# Patient Record
Sex: Female | Born: 1960 | Race: White | Hispanic: No | Marital: Married | Smoking: Never smoker
Health system: Southern US, Community
[De-identification: ages and names within clinical notes are randomized; demographics above are authoritative.]

## PROBLEM LIST (undated history)

## (undated) ENCOUNTER — Emergency Department (HOSPITAL_COMMUNITY): Payer: BC Managed Care – PPO

## (undated) DIAGNOSIS — D649 Anemia, unspecified: Secondary | ICD-10-CM

## (undated) DIAGNOSIS — E669 Obesity, unspecified: Secondary | ICD-10-CM

## (undated) DIAGNOSIS — Z86010 Personal history of colonic polyps: Secondary | ICD-10-CM

## (undated) DIAGNOSIS — I10 Essential (primary) hypertension: Secondary | ICD-10-CM

## (undated) DIAGNOSIS — G4733 Obstructive sleep apnea (adult) (pediatric): Secondary | ICD-10-CM

## (undated) DIAGNOSIS — E785 Hyperlipidemia, unspecified: Secondary | ICD-10-CM

## (undated) DIAGNOSIS — K5520 Angiodysplasia of colon without hemorrhage: Secondary | ICD-10-CM

## (undated) DIAGNOSIS — K5792 Diverticulitis of intestine, part unspecified, without perforation or abscess without bleeding: Secondary | ICD-10-CM

## (undated) DIAGNOSIS — E119 Type 2 diabetes mellitus without complications: Secondary | ICD-10-CM

## (undated) DIAGNOSIS — K219 Gastro-esophageal reflux disease without esophagitis: Secondary | ICD-10-CM

## (undated) DIAGNOSIS — Z9989 Dependence on other enabling machines and devices: Secondary | ICD-10-CM

## (undated) HISTORY — DX: Obstructive sleep apnea (adult) (pediatric): G47.33

## (undated) HISTORY — DX: Essential (primary) hypertension: I10

## (undated) HISTORY — PX: COLONOSCOPY: SHX174

## (undated) HISTORY — DX: Type 2 diabetes mellitus without complications: E11.9

## (undated) HISTORY — DX: Gastro-esophageal reflux disease without esophagitis: K21.9

## (undated) HISTORY — DX: Diverticulitis of intestine, part unspecified, without perforation or abscess without bleeding: K57.92

## (undated) HISTORY — DX: Personal history of colonic polyps: Z86.010

## (undated) HISTORY — DX: Angiodysplasia of colon without hemorrhage: K55.20

## (undated) HISTORY — DX: Dependence on other enabling machines and devices: Z99.89

## (undated) HISTORY — PX: ABDOMINAL HYSTERECTOMY: SHX81

## (undated) HISTORY — PX: POLYPECTOMY: SHX149

## (undated) HISTORY — PX: TUBAL LIGATION: SHX77

## (undated) HISTORY — DX: Anemia, unspecified: D64.9

## (undated) HISTORY — DX: Obesity, unspecified: E66.9

## (undated) HISTORY — DX: Hyperlipidemia, unspecified: E78.5

---

## 2003-08-26 ENCOUNTER — Encounter: Payer: Self-pay | Admitting: Internal Medicine

## 2004-01-05 ENCOUNTER — Ambulatory Visit: Payer: Self-pay | Admitting: Internal Medicine

## 2004-01-12 ENCOUNTER — Ambulatory Visit: Payer: Self-pay | Admitting: Internal Medicine

## 2004-02-08 ENCOUNTER — Ambulatory Visit (HOSPITAL_COMMUNITY): Admission: RE | Admit: 2004-02-08 | Discharge: 2004-02-08 | Payer: Self-pay | Admitting: Internal Medicine

## 2004-02-08 ENCOUNTER — Ambulatory Visit: Payer: Self-pay | Admitting: Internal Medicine

## 2006-09-24 ENCOUNTER — Encounter: Admission: RE | Admit: 2006-09-24 | Discharge: 2006-09-24 | Payer: Self-pay | Admitting: Internal Medicine

## 2007-09-25 ENCOUNTER — Encounter: Admission: RE | Admit: 2007-09-25 | Discharge: 2007-09-25 | Payer: Self-pay | Admitting: Family Medicine

## 2008-02-24 ENCOUNTER — Encounter: Admission: RE | Admit: 2008-02-24 | Discharge: 2008-02-24 | Payer: Self-pay | Admitting: Internal Medicine

## 2009-06-29 ENCOUNTER — Encounter: Payer: Self-pay | Admitting: Internal Medicine

## 2009-08-16 ENCOUNTER — Ambulatory Visit: Payer: Self-pay | Admitting: Internal Medicine

## 2009-08-16 DIAGNOSIS — D509 Iron deficiency anemia, unspecified: Secondary | ICD-10-CM | POA: Insufficient documentation

## 2009-08-16 DIAGNOSIS — R197 Diarrhea, unspecified: Secondary | ICD-10-CM | POA: Insufficient documentation

## 2009-08-16 DIAGNOSIS — K5520 Angiodysplasia of colon without hemorrhage: Secondary | ICD-10-CM | POA: Insufficient documentation

## 2009-08-16 HISTORY — DX: Angiodysplasia of colon without hemorrhage: K55.20

## 2009-08-16 LAB — CONVERTED CEMR LAB
Basophils Absolute: 0 10*3/uL (ref 0.0–0.1)
Basophils Relative: 0.3 % (ref 0.0–3.0)
Eosinophils Absolute: 0.1 10*3/uL (ref 0.0–0.7)
Lymphocytes Relative: 22 % (ref 12.0–46.0)
MCHC: 32.8 g/dL (ref 30.0–36.0)
MCV: 70.2 fL — ABNORMAL LOW (ref 78.0–100.0)
Monocytes Absolute: 0.7 10*3/uL (ref 0.1–1.0)
Neutrophils Relative %: 69.8 % (ref 43.0–77.0)
Platelets: 240 10*3/uL (ref 150.0–400.0)
RBC: 4.41 M/uL (ref 3.87–5.11)
RDW: 19.1 % — ABNORMAL HIGH (ref 11.5–14.6)

## 2009-08-18 LAB — CONVERTED CEMR LAB: Tissue Transglutaminase Ab, IgA: 12.2 units (ref <20)

## 2009-09-05 ENCOUNTER — Ambulatory Visit: Payer: Self-pay | Admitting: Gynecology

## 2009-09-05 ENCOUNTER — Other Ambulatory Visit: Admission: RE | Admit: 2009-09-05 | Discharge: 2009-09-05 | Payer: Self-pay | Admitting: Gynecology

## 2009-09-11 ENCOUNTER — Ambulatory Visit: Payer: Self-pay | Admitting: Gynecology

## 2009-09-25 ENCOUNTER — Ambulatory Visit: Payer: Self-pay | Admitting: Gynecology

## 2009-09-28 ENCOUNTER — Ambulatory Visit: Payer: Self-pay | Admitting: Gynecology

## 2009-10-13 ENCOUNTER — Ambulatory Visit: Payer: Self-pay | Admitting: Gynecology

## 2009-11-10 ENCOUNTER — Ambulatory Visit: Payer: Self-pay | Admitting: Gynecology

## 2009-11-13 ENCOUNTER — Ambulatory Visit: Payer: Self-pay | Admitting: Gynecology

## 2009-11-20 ENCOUNTER — Ambulatory Visit: Payer: Self-pay | Admitting: Gynecology

## 2009-11-29 ENCOUNTER — Ambulatory Visit (HOSPITAL_COMMUNITY)
Admission: RE | Admit: 2009-11-29 | Discharge: 2009-11-29 | Payer: Self-pay | Source: Home / Self Care | Admitting: Gynecology

## 2009-12-18 ENCOUNTER — Ambulatory Visit: Payer: Self-pay | Admitting: Gynecology

## 2009-12-18 ENCOUNTER — Encounter: Payer: Self-pay | Admitting: Gynecology

## 2009-12-18 ENCOUNTER — Inpatient Hospital Stay (HOSPITAL_COMMUNITY)
Admission: RE | Admit: 2009-12-18 | Discharge: 2009-12-20 | Payer: Self-pay | Source: Home / Self Care | Attending: Gynecology | Admitting: Gynecology

## 2009-12-29 ENCOUNTER — Ambulatory Visit: Payer: Self-pay | Admitting: Gynecology

## 2010-01-10 ENCOUNTER — Ambulatory Visit: Payer: Self-pay | Admitting: Gynecology

## 2010-01-31 ENCOUNTER — Ambulatory Visit
Admission: RE | Admit: 2010-01-31 | Discharge: 2010-01-31 | Payer: Self-pay | Source: Home / Self Care | Attending: Gynecology | Admitting: Gynecology

## 2010-02-07 ENCOUNTER — Encounter
Admission: RE | Admit: 2010-02-07 | Discharge: 2010-02-07 | Payer: Self-pay | Source: Home / Self Care | Attending: Gynecology | Admitting: Gynecology

## 2010-02-13 NOTE — Procedures (Signed)
Summary: EGD:    EGD  Procedure date:  01/12/2004  Findings:      Findings: Abnormal Exam Location:  [facility]  Findings - Normal: Proximal Esophagus to Antrum.  Normal: Duodenal Bulb.  MUCOSAL ABNORMALITY: Duodenal Apex to Duodenal 2nd Portion. Granular mucosa. Biopsy/Mucosal Abn taken. Comment: Mild, probably normal but biopsied to exclude sprue.   Assessment Abnormal examination, see findings above.  Comments: ? Sprue, await path.   Pathology: BENIGN SMALL BOWEL MUCOSA.  NO ACTIVE INFLAMMATION OR VILLOUS ATROPHY IDENTIFIED. Patient Name: Catherine, Trevino MRN:  Procedure Procedures: Panendoscopy (EGD) CPT: 43235.    with biopsy(s)/brushing(s). CPT: D1846139.  Personnel: Endoscopist: Iva Boop, MD, [facility].  Referred By: Maylon Peppers Cleta Alberts, MD.  Exam Location: Exam performed in Outpatient Clinic. Outpatient  Patient Consent: Procedure, Alternatives, Risks and Benefits discussed, consent obtained, from patient. Consent was obtained by the RN.  Indications  Evaluation of: Anemia,   Symptoms: Diarrhea.  History  Current Medications: Patient is not currently taking Coumadin.  Comments: DIARRHEA, ANEMIA. NEGATIVE COLONOSCOPY TO TERMINAL ILEUM (with biopsies). SHE IS ON IRON AND ANEMIA NOT IMPROVED PER PT. Pre-Exam Physical: Performed Jan 12, 2004  Cardio-pulmonary exam, HEENT exam, Mental status exam WNL.  Exam Exam Info: Maximum depth of insertion Duodenum, intended Duodenum. Patient position: on left side. Gastric retroflexion performed. Images taken. ASA Classification: I. Tolerance: excellent.  Sedation Meds: Patient assessed and found to be appropriate for moderate (conscious) sedation. Fentanyl 50 mcg. given IV. Versed 7 mg. given IV. Cetacaine Spray 2 sprays given aerosolized.  Monitoring: BP and pulse monitoring done. Oximetry used. Supplemental O2 given  Findings - Normal: Proximal Esophagus to Antrum.  Normal: Duodenal Bulb.    MUCOSAL ABNORMALITY: Duodenal Apex to Duodenal 2nd Portion. Granular mucosa. Biopsy/Mucosal Abn taken. Comment: Mild, probably normal but biopsied to exclude sprue.   Assessment Abnormal examination, see findings above.  Comments: ? Sprue, await path.  Events  Unplanned Intervention: No unplanned interventions were required.  Plans Medication(s): Await pathology.  Disposition: After procedure patient sent to recovery. After recovery patient sent home.  Scheduling: Await pathology to schedule patient.  Comments: If this is normal I think a capsule endoscopy of small bowel may be needed. Will discuss with patient and [name] when path returns.  CC:   Lesle Chris, MD  This report was created from the original endoscopy report, which was reviewed and signed by the above listed endoscopist.

## 2010-02-13 NOTE — Procedures (Signed)
Summary: Capsule Endoscopy/[place]  Capsule Endoscopy/[place]   Imported By: Sherian Rein 08/17/2009 08:54:54  _____________________________________________________________________  External Attachment:    Type:   Image     Comment:   External Document

## 2010-02-13 NOTE — Procedures (Signed)
Summary: Colonoscopy: Diverticulosis   Colonoscopy  Procedure date:  08/26/2003  Findings:      Results: Diverticulosis. Location:  [facility].  Findings - NORMAL EXAM: Terminal Ileum to Descending Colon. Biopsy/Normal Exam taken. Comments: RANDOM COLON BIOPSIES.  DIVERTICULOSIS: Sigmoid Colon.   Pathology: Essentially unremakrable colonic mucosa, negative for active and/or microscopic colitis.  Procedures Next Due Date:    Colonoscopy: 08/2011  [facility] Reports-CCC] Patient Name: Catherine Trevino, Catherine Trevino MRN:  Procedure Procedures: Colonoscopy CPT: 16109.    with biopsy. CPT: Q5068410.  Personnel: Endoscopist: Iva Boop, MD, [facility].  Referred By: Maylon Peppers Cleta Alberts, MD.  Exam Location: Exam performed in Outpatient Clinic. Outpatient  Patient Consent: Procedure, Alternatives, Risks and Benefits discussed, consent obtained, from patient. Consent was obtained by the RN.  Indications  Evaluation of: Anemia Positive fecal occult blood test  Symptoms: Diarrhea Abdominal pain / bloating.  History  Current Medications: Patient is not currently taking Coumadin.  Comments: HGB 7, HEME +, LLQ PAIN AND DIRRHEA X 6 MOS. DENIES MENORRHAGIA. SHE SAYS CT ABD/PELVIS NEGATIVE Pre-Exam Physical: Performed Aug 26, 2003. Cardio-pulmonary exam, Rectal exam, HEENT exam  WNL. Abdominal exam abnormal. Mental status exam WNL.  Exam Exam: Extent of exam reached: Terminal Ileum, extent intended: Terminal Ileum.  The cecum was identified by appendiceal orifice and IC valve. Patient position: on left side. Colon retroflexion performed. Images taken. ASA Classification: I. Tolerance: excellent.  Monitoring: Pulse and BP monitoring, Oximetry used. Supplemental O2 given.  Colon Prep Used Half-Lytely for colon prep. Prep results: good.  Sedation Meds: Patient assessed and found to be appropriate for moderate (conscious) sedation. Fentanyl 100 mcg. given IV. Versed 10 mg. given IV.    Findings - NORMAL EXAM: Terminal Ileum to Descending Colon. Biopsy/Normal Exam taken. Comments: RANDOM COLON BIOPSIES.  DIVERTICULOSIS: Sigmoid Colon. ICD9: Diverticulosis, Colon: 562.10. Comments: MILD.  NORMAL EXAM: Rectum.   Assessment Abnormal examination, see findings above.  Diagnoses: 562.10: Diverticulosis, Colon.   Comments: NO CAUSE OF ANEMIA, HEME + STOOL OR DIARRHEA OBVIOUS. BIOPSIES PENDING. Events  Unplanned Interventions: No intervention was required.  Plans Medication Plan: Referring provider to order medications.  Comments: LOMOTIL RX (IMODIUM DIDN'T WORK) Disposition: After procedure patient sent to recovery. After recovery patient sent home.  Scheduling/Referral: Path Letter, to The Patient, RE: RESULTS,  Clinic Visit, to [facility] A. Cleta Alberts, MD, AS PLANNED,   Comments: SHE MAY NEED AN EGD IF PATH IS UNREVEALING (SUSPECT IT WILL BE)  CC: Lesle Chris, MD  This report was created from the original endoscopy report, which was reviewed and signed by the above listed endoscopist.

## 2010-02-13 NOTE — Procedures (Signed)
Summary: Capsule Endoscopy: Gastric and Small Bowel AVM's   Capsule Endoscopy  Procedure date:  02/08/2004  Findings:      Abnormal: Performing Location: [facility]   PROCEDURE INFO & FINDINGS: TWO SMALL BOWEL AVM'S (JEJUNUM) AND A TINY ANTRAL AVM.  MUCH OF ILEUM NOT SEEN WELL DUE TO FLUID.    Appended Document: Capsule Endoscopy: Gastric and Small Bowel AVM's NAME:  Catherine Trevino, Catherine Trevino             ACCOUNT NO.:  [id number]   MEDICAL RECORD NO.:  [id number]          PATIENT TYPE:  AMB   LOCATION:  ENDO                         FACILITY:  [facility]   PHYSICIAN:  Iva Boop, M.D. LHCDATE OF BIRTH:  Jun 23, 1960   DATE OF PROCEDURE:  02/08/2004  DATE OF DISCHARGE:  02/08/2004                                 OPERATIVE REPORT   PROCEDURE:  Small bowel capsule endoscopy.   INDICATIONS FOR PROCEDURE:  Hemoccult-positive stool, iron deficiency  anemia.  Upper GI endoscopy and colonoscopy have not yielded a cause for her  problems.   DESCRIPTION OF PROCEDURE:  Small bowel capsule endoscopy was carried out  under the usual protocol at [facility].  She tolerated the  procedure well.  Gastric passage time was 31 minutes, and small bowel  passage time 3 hours and 11 minutes.   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  She had fluid in the ileum which limited views there.   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  This is the probable cause of her iron  deficiency anemia.  Would continue iron as needed either orally or  parenterally.  No further gastrointestinal workup at this time.  I would be  happy to see her back if there are persistent concerns and questions.      CEG/MEDQ  D:  02/21/2004  T:  02/21/2004  Job:  [id number]   cc:   Naomie Dean

## 2010-02-13 NOTE — Assessment & Plan Note (Signed)
Summary: angiodysplasia of the intestine--ch.   History of Present Illness Visit Type: consult  Primary GI MD: Stan Head MD [facility] Primary Provider: Sandria Bales. Alwyn Ren, MD  Requesting Provider: Sandria Bales. Alwyn Ren, MD  Chief Complaint: Upper abd pain  History of Present Illness:   50 yo ww with prior iron-deficiency anemia issues. Seen in 2005 and had ancolonoscopy, EGD and capsule endoscopy.  A few AVM's seen (1 tiny gastric and some small bowel) on capsule endoscopy.  Still has intermittent diarrhea but can be days without. RUQ pain at times, had US,no gallstones. Intermittent problem, cannot correlate with eating, does not disturb sleep. She may het it a few times a month, lasts 15-20 mintes. sharp with residual soreness.  Anemia has persisted over time despite taking taking iron daily (max wice a day). Hgb has not normalized she says. She started alfalfa supplements to reduce period bleeding.  Constant fatigue.   Still menstruates and they are initially heavy. Not like that in past. She bleeds 7 days, and for a few years and they are loner in duration and heavier over time.   GI Review of Systems    Reports abdominal pain, acid reflux, and  heartburn.     Location of  Abdominal pain: upper abdomen.    Denies belching, bloating, chest pain, dysphagia with liquids, dysphagia with solids, loss of appetite, nausea, vomiting, vomiting blood, weight loss, and  weight gain.      Reports hemorrhoids.     Denies anal fissure, black tarry stools, change in bowel habit, constipation, diarrhea, diverticulosis, fecal incontinence, heme positive stool, irritable bowel syndrome, jaundice, light color stool, liver problems, rectal bleeding, and  rectal pain.    Clinical Reports Reviewed:  Colonoscopy:  08/26/2003:  Results: Diverticulosis. Location:  [facility].  Findings - NORMAL EXAM: Terminal Ileum to Descending Colon. Biopsy/Normal Exam taken. Comments: RANDOM COLON BIOPSIES.    DIVERTICULOSIS: Sigmoid Colon.   Pathology: Essentially unremakrable colonic mucosa, negative for active and/or microscopic colitis.  EGD:  01/12/2004:  Findings: Abnormal Exam Location: [facility]  Findings - Normal: Proximal Esophagus to Antrum.  Normal: Duodenal Bulb.  MUCOSAL ABNORMALITY: Duodenal Apex to Duodenal 2nd Portion. Granular mucosa. Biopsy/Mucosal Abn taken. Comment: Mild, probably normal but biopsied to exclude sprue.   Assessment Abnormal examination, see findings above.  Comments: ? Sprue, await path.   Pathology: BENIGN SMALL BOWEL MUCOSA.  NO ACTIVE INFLAMMATION OR VILLOUS ATROPHY IDENTIFIED.  Capsule Endoscopy:  02/08/2004:  Abnormal: Performing Location: [facility]   PROCEDURE INFO & FINDINGS: TWO SMALL BOWEL AVM'S (JEJUNUM) AND A TINY ANTRAL AVM.  MUCH OF ILEUM NOT SEEN WELL DUE TO FLUID.   Current Medications (verified): 1)  Hydrochlorothiazide 12.5 Mg Caps (Hydrochlorothiazide) .... Take 1 Tablet By Mouth Once A Day 2)  Poly-Iron 150 Forte 150-25-1 Mg-Mcg-Mg Caps (Iron Polysacch Cmplx-B12-Fa) .... One Tablet By Mouth Two Times A Day 3)  Evening Primrose Oil 500 Mg Caps (Evening Primrose Oil) .... One Capsule By Mouth Once Daily 4)  Alfalfa 650 Mg Tabs (Alfalfa) .... One Tablet By Mouth Once Daily  Allergies (verified): No Known Drug Allergies  Past History:  Past Medical History: Vitamin D deficiency Anemia-Iron deficiency  Hypertension Obesity GERD  Past Surgical History: Reviewed history from 08/16/2009 and no changes required. 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  Alcohol Use - no Daily Caffeine Use: 1 daily  Illicit Drug Use - no Smoking Status:  never Drug Use:  no  Review of Systems       The patient complains of sleeping problems and swelling of feet/legs.         All other ROS  negative except as per HPI.   Vital Signs:  Patient profile:   50 year old female Height:      64 inches Weight:      222 pounds BMI:     38.24 BSA:     2.05 Pulse rate:   76 / minute Pulse rhythm:   regular BP sitting:   126 / 74  (left arm) Cuff size:   regular  Vitals Entered By: Ok Anis CMA (August 16, 2009 2:46 PM)  Physical Exam  General:  obese.  NAD Eyes:  PERRLA, no icterus. Mouth:  No deformity or lesions, dentition normal. Neck:  Supple; no masses or thyromegaly. Lungs:  Clear throughout to auscultation. Heart:  Regular rate and rhythm; no murmurs, rubs,  or bruits. Abdomen:  Soft, nontender and nondistended. No masses, hepatosplenomegaly or hernias noted. Normal bowel sounds. Extremities:  No clubbing, cyanosis, edema or deformities noted. Neurologic:  Alert and  oriented x4;  grossly normal neurologically. Cervical Nodes:  No significant cervical or supraclavicular adenopathy.  Psych:  Alert and cooperative. Normal mood and affect.  [facility] office notes, iron studies reviewed  Impression & Recommendations:  Problem # 1:  ANEMIA-IRON DEFICIENCY (ICD-280.9) Assessment New Ferritin 16, iron sat 4% Differential dx includes GI loss vs GYN loss vs malabsorption. at least pat GYN loss  Plan: hemoccults celiac ab may need iron infusion GYN eval? - await these tests review last year of Hgbs could need repeat endoscopic evaluation (and will if heme +) I think a GYN evaluation is appropriate regardless.  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  2)  We will call you with further follow up after reviewing these results.  3)  We will obtain CBC's for the past year from Dr. Alwyn Ren and call you with any further follow up. 4)  Please return home hemoccult tests. 5)  Copy sent to : Janace Hoard, MD 6)  The medication list was reviewed and reconciled.  All changed / newly prescribed medications were explained.  A complete medication list was provided to the patient / caregiver.

## 2010-02-13 NOTE — Letter (Signed)
Summary: [facility]  [facility]   Imported By: Sherian Rein 08/21/2009 11:13:27  _____________________________________________________________________  External Attachment:    Type:   Image     Comment:   External Document

## 2010-03-27 LAB — COMPREHENSIVE METABOLIC PANEL
ALT: 31 U/L (ref 0–35)
ALT: 23 U/L (ref 0–35)
AST: 20 U/L (ref 0–37)
Alkaline Phosphatase: 75 U/L (ref 39–117)
Alkaline Phosphatase: 71 U/L (ref 39–117)
BUN: 10 mg/dL (ref 6–23)
CO2: 25 mEq/L (ref 19–32)
CO2: 30 mEq/L (ref 19–32)
Calcium: 8.8 mg/dL (ref 8.4–10.5)
Calcium: 9 mg/dL (ref 8.4–10.5)
GFR calc Af Amer: >60 mL/min (ref >60)
GFR calc non Af Amer: >60 mL/min (ref >60)
GFR calc non Af Amer: >60 mL/min (ref >60)
Glucose, Bld: 85 mg/dL (ref 70–99)
Potassium: 3.7 mEq/L (ref 3.5–5.1)
Potassium: 4.2 mEq/L (ref 3.5–5.1)
Sodium: 136 mEq/L (ref 135–145)
Sodium: 145 mEq/L (ref 135–145)

## 2010-03-27 LAB — SURGICAL PCR SCREEN
MRSA, PCR: NEGATIVE
Staphylococcus aureus: NEGATIVE

## 2010-03-27 LAB — APTT: aPTT: 31 seconds (ref 24–37)

## 2010-03-27 LAB — URINE MICROSCOPIC-ADD ON

## 2010-03-27 LAB — URINALYSIS, ROUTINE W REFLEX MICROSCOPIC
Bilirubin Urine: NEGATIVE
Bilirubin Urine: NEGATIVE
Hgb urine dipstick: NEGATIVE
Ketones, ur: NEGATIVE mg/dL
Nitrite: NEGATIVE
Protein, ur: NEGATIVE mg/dL
Specific Gravity, Urine: 1.025 (ref 1.005–1.030)
Specific Gravity, Urine: >1.03 — ABNORMAL HIGH (ref 1.005–1.030)
Urobilinogen, UA: 0.2 mg/dL (ref 0.0–1.0)
pH: 5.5 (ref 5.0–8.0)

## 2010-03-27 LAB — VON WILLEBRAND PANEL
Ristocetin-Cofactor: 58 % (ref 50–150)
Von Willebrand Ag: 68 % normal (ref 61–164)

## 2010-03-27 LAB — FIBRINOGEN: Fibrinogen: 412 mg/dL (ref 204–475)

## 2010-03-27 LAB — CBC
HCT: 39.8 % (ref 36.0–46.0)
Hemoglobin: 13.4 g/dL (ref 12.0–15.0)
Hemoglobin: 11.8 g/dL — ABNORMAL LOW (ref 12.0–15.0)
MCHC: 33.6 g/dL (ref 30.0–36.0)
MCHC: 32.8 g/dL (ref 30.0–36.0)
MCV: 80.3 fL (ref 78.0–100.0)
Platelets: 220 10*3/uL (ref 150–400)
RBC: 4.51 MIL/uL (ref 3.87–5.11)
RDW: 13 % (ref 11.5–15.5)
RDW: 18.7 % — ABNORMAL HIGH (ref 11.5–15.5)
WBC: 10.6 10*3/uL — ABNORMAL HIGH (ref 4.0–10.5)
WBC: 5.9 10*3/uL (ref 4.0–10.5)

## 2010-03-27 LAB — PROTIME-INR: Prothrombin Time: 12.3 seconds (ref 11.6–15.2)

## 2010-03-27 LAB — ABO/RH: ABO/RH(D): O POS

## 2010-03-27 LAB — TYPE AND SCREEN: ABO/RH(D): O POS

## 2010-06-01 NOTE — Op Note (Signed)
[name], Catherine Trevino             ACCOUNT NO.:  [id number]   MEDICAL RECORD NO.:  [id number]          PATIENT TYPE:  AMB   LOCATION:  ENDO                         [facility]AC[facility]   PHYSICIAN:  Iva Boop, M.D. LHCDATE OF BIRTH:  12-Dec-1960   DATE OF PROCEDURE:  02/08/2004  DATE OF DISCHARGE:  02/08/2004                                 OPERATIVE REPORT   PROCEDURE:  Small bowel capsule endoscopy.   INDICATIONS FOR PROCEDURE:  Hemoccult-positive stool, iron deficiency  anemia.  Upper GI endoscopy and colonoscopy have not yielded a cause for her  problems.   DESCRIPTION OF PROCEDURE:  Small bowel capsule endoscopy was carried out  under the usual protocol at [facility].  She tolerated the  procedure well.  Gastric passage time was 31 minutes, and small bowel  passage time 3 hours and 11 minutes.   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  She had fluid in the ileum which limited views there.   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  This is the probable cause of her iron  deficiency anemia.  Would continue iron as needed either orally or  parenterally.  No further gastrointestinal workup at this time.  I would be  happy to see her back if there are persistent concerns and questions.      CEG/MEDQ  D:  02/21/2004  T:  02/21/2004  Job:  [id number]   cc:   Naomie Dean

## 2011-04-01 ENCOUNTER — Other Ambulatory Visit: Payer: Self-pay | Admitting: Family Medicine

## 2011-06-30 ENCOUNTER — Other Ambulatory Visit: Payer: Self-pay | Admitting: Internal Medicine

## 2011-07-15 ENCOUNTER — Encounter: Payer: Self-pay | Admitting: Internal Medicine

## 2011-10-22 ENCOUNTER — Telehealth: Payer: Self-pay | Admitting: *Deleted

## 2011-10-22 NOTE — Telephone Encounter (Signed)
CT scan of her head was normal. Has had a migraine for days that won't go away. Wants to speak to Dr. Riley Kill.

## 2011-10-23 NOTE — Telephone Encounter (Signed)
Is this my patient? 

## 2011-10-23 NOTE — Telephone Encounter (Signed)
Message entered in error on this patient.

## 2011-11-20 ENCOUNTER — Ambulatory Visit (INDEPENDENT_AMBULATORY_CARE_PROVIDER_SITE_OTHER): Payer: BC Managed Care – PPO | Admitting: Family Medicine

## 2011-11-20 VITALS — BP 156/85 | HR 98 | Temp 98.2°F | Resp 16 | Ht 65.0 in | Wt 248.0 lb

## 2011-11-20 DIAGNOSIS — R059 Cough, unspecified: Secondary | ICD-10-CM

## 2011-11-20 DIAGNOSIS — R05 Cough: Secondary | ICD-10-CM

## 2011-11-20 MED ORDER — AZITHROMYCIN 500 MG PO TABS: 500.0000 mg | ORAL_TABLET | Freq: Every day | ORAL | Status: DC

## 2011-11-20 MED ORDER — BENZONATATE 100 MG PO CAPS: 100.0000 mg | ORAL_CAPSULE | Freq: Two times a day (BID) | ORAL | Status: DC | PRN

## 2011-11-20 MED ORDER — HYDROCHLOROTHIAZIDE 12.5 MG PO CAPS: 12.5000 mg | ORAL_CAPSULE | Freq: Every day | ORAL | Status: DC

## 2011-11-20 MED ORDER — ALBUTEROL SULFATE (2.5 MG/3ML) 0.083% IN NEBU: 2.5000 mg | INHALATION_SOLUTION | Freq: Four times a day (QID) | RESPIRATORY_TRACT | Status: DC | PRN

## 2011-11-20 MED ORDER — CEFTRIAXONE SODIUM 1 G IJ SOLR
1.0000 g | Freq: Once | INTRAMUSCULAR | Status: AC
  Administered 2011-11-20: 1 g via INTRAMUSCULAR

## 2011-11-20 MED ORDER — ALBUTEROL SULFATE HFA 108 (90 BASE) MCG/ACT IN AERS: 2.0000 | INHALATION_SPRAY | Freq: Four times a day (QID) | RESPIRATORY_TRACT | Status: DC | PRN

## 2011-11-20 NOTE — Patient Instructions (Signed)
Very nice to meet you. I am going to treat you for bronchitis Your given a shot today and then you going to take one pill daily for 3 days of azithromycin. I have refilled her hydrochlorothiazide. I'm also giving you an inhaler to help with any shortness of breath. Please read below information in you can followup as needed.  Bronchitis Bronchitis is the body's way of reacting to injury and/or infection (inflammation) of the bronchi. Bronchi are the air tubes that extend from the windpipe into the lungs. If the inflammation becomes severe, it may cause shortness of breath. CAUSES  Inflammation may be caused by:  A virus.  Germs (bacteria).  Dust.  Allergens.  Pollutants and many other irritants. The cells lining the bronchial tree are covered with tiny hairs (cilia). These constantly beat upward, away from the lungs, toward the mouth. This keeps the lungs free of pollutants. When these cells become too irritated and are unable to do their job, mucus begins to develop. This causes the characteristic cough of bronchitis. The cough clears the lungs when the cilia are unable to do their job. Without either of these protective mechanisms, the mucus would settle in the lungs. Then you would develop pneumonia. Smoking is a common cause of bronchitis and can contribute to pneumonia. Stopping this habit is the single most important thing you can do to help yourself. TREATMENT   Your caregiver may prescribe an antibiotic if the cough is caused by bacteria. Also, medicines that open up your airways make it easier to breathe. Your caregiver may also recommend or prescribe an expectorant. It will loosen the mucus to be coughed up. Only take over-the-counter or prescription medicines for pain, discomfort, or fever as directed by your caregiver.  Removing whatever causes the problem (smoking, for example) is critical to preventing the problem from getting worse.  Cough suppressants may be prescribed  for relief of cough symptoms.  Inhaled medicines may be prescribed to help with symptoms now and to help prevent problems from returning.  For those with recurrent (chronic) bronchitis, there may be a need for steroid medicines. SEEK IMMEDIATE MEDICAL CARE IF:   During treatment, you develop more pus-like mucus (purulent sputum).  You have a fever.  Your baby is older than 3 months with a rectal temperature of 102 F (38.9 C) or higher.  Your baby is 38 months old or younger with a rectal temperature of 100.4 F (38 C) or higher.  You become progressively more ill.  You have increased difficulty breathing, wheezing, or shortness of breath. It is necessary to seek immediate medical care if you are elderly or sick from any other disease. MAKE SURE YOU:   Understand these instructions.  Will watch your condition.  Will get help right away if you are not doing well or get worse. Document Released: 12/31/2004 Document Revised: 03/25/2011 Document Reviewed: 11/10/2007 [facility] Patient Information 2013 [place], [place].

## 2011-11-20 NOTE — Progress Notes (Signed)
SUBJECTIVE:  Catherine Trevino is a 51 y.o. female who complains of congestion, sore throat, swollen glands, post nasal drip, dry cough, chills and hoarseness for 4 days. She denies a history of chest pain, dizziness, fatigue, nausea, vomiting, weakness and weight loss. She denies a history of asthma but does have a history of bronchitis one year ago. Patient states that this feels about the same. Patient does not smoke cigarettes.   14 system review of systems done and unremarkable as related to the problem.  Past medical history, social, surgical and family history all reviewed.    OBJECTIVE: Vitals as noted above. Appearance: alert, well appearing, and in no distress.  ENT- neck has bilateral anterior cervical nodes enlarged, pharynx erythematous without exudate, post nasal drip noted and nasal mucosa congested.  Chest - no tachypnea, retractions or cyanosis, wheezing noted in all fields but minimal. No focal findings.  ASSESSMENT:  bronchitis  PLAN: Symptomatic therapy suggested: push fluids, rest, use vaporizer or mist prn, return office visit prn if symptoms persist or worsen and discussed red flags and when to seek medical attention. Call or return to clinic prn if these symptoms worsen or fail to improve as anticipated. Azithro ventolin, tessalon rx Also prescribed HCTZ for her BP which we did not talk about and she will discuss this with [name] at her regular follow up.

## 2011-11-20 NOTE — Addendum Note (Signed)
Addended by: Morrell Riddle on: 11/20/2011 08:16 PM   Modules accepted: Orders

## 2012-01-14 ENCOUNTER — Other Ambulatory Visit: Payer: Self-pay | Admitting: Family Medicine

## 2012-01-22 ENCOUNTER — Other Ambulatory Visit: Payer: Self-pay | Admitting: Family Medicine

## 2012-01-23 NOTE — Telephone Encounter (Signed)
Okay to call in  2 refills. She needs an appt.  to be seen here so we can do blood work and check her potassium and kidney function.

## 2012-01-24 NOTE — Telephone Encounter (Signed)
This is done, for #90 patient aware of need for appt.

## 2012-04-20 ENCOUNTER — Other Ambulatory Visit: Payer: Self-pay | Admitting: Emergency Medicine

## 2012-04-22 ENCOUNTER — Encounter: Payer: Self-pay | Admitting: Internal Medicine

## 2012-05-29 ENCOUNTER — Other Ambulatory Visit: Payer: Self-pay | Admitting: Emergency Medicine

## 2012-05-31 ENCOUNTER — Other Ambulatory Visit: Payer: Self-pay | Admitting: Physician Assistant

## 2012-07-03 ENCOUNTER — Ambulatory Visit
Admission: RE | Admit: 2012-07-03 | Discharge: 2012-07-03 | Disposition: A | Discharge status: Home / Self Care | Payer: BC Managed Care – PPO | Source: Ambulatory Visit | Attending: Physician Assistant | Admitting: Physician Assistant

## 2012-07-03 ENCOUNTER — Telehealth: Payer: Self-pay | Admitting: Radiology

## 2012-07-03 ENCOUNTER — Ambulatory Visit (INDEPENDENT_AMBULATORY_CARE_PROVIDER_SITE_OTHER): Payer: BC Managed Care – PPO | Admitting: Physician Assistant

## 2012-07-03 VITALS — BP 168/102 | HR 90 | Temp 97.7°F | Resp 16 | Ht 65.0 in | Wt 253.0 lb

## 2012-07-03 DIAGNOSIS — R51 Headache: Secondary | ICD-10-CM

## 2012-07-03 DIAGNOSIS — I10 Essential (primary) hypertension: Secondary | ICD-10-CM | POA: Insufficient documentation

## 2012-07-03 DIAGNOSIS — K219 Gastro-esophageal reflux disease without esophagitis: Secondary | ICD-10-CM

## 2012-07-03 DIAGNOSIS — J029 Acute pharyngitis, unspecified: Secondary | ICD-10-CM

## 2012-07-03 LAB — POCT CBC
Granulocyte percent: 63 %G (ref 37–80)
HCT, POC: 45.6 % (ref 37.7–47.9)
Lymph, poc: 2.2 (ref 0.6–3.4)
MCHC: 32.9 g/dL (ref 31.8–35.4)
MID (cbc): 0.5 (ref 0–0.9)
MPV: 8.3 fL (ref 0–99.8)
POC Granulocyte: 4.7 (ref 2–6.9)
POC LYMPH PERCENT: 30.1 %L (ref 10–50)
POC MID %: 6.9 %M (ref 0–12)
Platelet Count, POC: 218 10*3/uL (ref 142–424)
RDW, POC: 14.1 %

## 2012-07-03 MED ORDER — HYDROCHLOROTHIAZIDE 12.5 MG PO CAPS: ORAL_CAPSULE | ORAL | Status: DC

## 2012-07-03 MED ORDER — ESOMEPRAZOLE MAGNESIUM 40 MG PO CPDR: 40.0000 mg | DELAYED_RELEASE_CAPSULE | Freq: Every day | ORAL | Status: DC

## 2012-07-03 NOTE — Telephone Encounter (Signed)
CT Head normal took call report from [facility], patient has gone home.

## 2012-07-03 NOTE — Patient Instructions (Addendum)
Your CT scan of your head will be performed at [facility], [address]. It is a walk clinic so there may be a little delay but they will fit you in as soon as possible. After the scan, we will be in contact with the results.

## 2012-07-03 NOTE — Telephone Encounter (Signed)
Notes Recorded by Fernande Bras, PA-C on 07/03/2012 at 1:08 PM Please call this patient, advise her that the head CT was normal. If her head pain persists once she's back on her BP med and gets some rest, please RTC.      Thanks I have called her to advise.

## 2012-07-03 NOTE — Progress Notes (Signed)
Subjective:    Patient ID: Catherine Trevino, female    DOB: 1960/08/08, 52 y.o.   MRN: [id number]  HPI This 52 y.o. female presents for evaluation of HA and sore throat, and also needing a refill of HCTZ (she's been out x 1 week). HA woke her from sleep at 2 am, and she notes that she hasn't had a HA like this in years.  It's described as waxing and waning in "waves" and as severe, stating, "I'm close to tears." Had some nausea and mild lightheadedness, and vomited x 1.  Took a dose of acetaminophen, without relief. Also notes a mild sore throat, and a dry cough/tickle in the throat.    She's had increased reflux symptoms recently, mostly in the afternoons.  She doesn't use any regular treatment for GERD. Her granddaughter was diagnosed with Strep throat on 06/29/2012.    No vision changes, vertigo, chest pain or SOB.  No ear pressure or fullness or popping.  No nasal/sinus symptoms.  Past medical history, surgical history, family history, social history and problem list reviewed. Hasn't had anemia rechecked since hysterectomy, DUB from uterine fibroids were thought to be the cause.  Review of Systems As above.    Objective:   Physical Exam  Blood pressure 168/102, pulse 90, temperature 97.7 F (36.5 C), temperature source Oral, resp. rate 16, height 5\' 5"  (1.651 m), weight 253 lb (114.76 kg), SpO2 96.00%. Body mass index is 42.1 kg/(m^2). Well-developed, well nourished WF who is awake, alert and oriented, in NAD. HEENT: [place]/AT, PERRL, EOMI.  Funduscopic exam is normal, though she expresses photophobia. Sclera and conjunctiva are clear.  EAC are patent, TMs are normal in appearance. Nasal mucosa is pink and moist. OP is clear. Neck: supple, non-tender, no lymphadenopathy, thyromegaly. FROM without pain, but she describes "stretching" feeling with full flexion. Heart: RRR, no murmur Lungs: normal effort, CTA Extremities: no cyanosis, clubbing or edema. Skin: warm and dry without  rash. Neurologic: A&O x 3, CN II-XII intact.  Normal sensation, motor function, reflexes.  Psychologic: good mood and appropriate affect, normal speech and behavior.  Results for orders placed in visit on 07/03/12  POCT RAPID STREP A (OFFICE)      Result Value Range   Rapid Strep A Screen Negative  Negative  POCT CBC      Result Value Range   WBC 7.4  4.6 - 10.2 K/uL   Lymph, poc 2.2  0.6 - 3.4   POC LYMPH PERCENT 30.1  10 - 50 %L   MID (cbc) 0.5  0 - 0.9   POC MID % 6.9  0 - 12 %M   POC Granulocyte 4.7  2 - 6.9   Granulocyte percent 63.0  37 - 80 %G   RBC 5.06  4.04 - 5.48 M/uL   Hemoglobin 15.0  12.2 - 16.2 g/dL   HCT, POC 11.9  14.7 - 47.9 %   MCV 90.1  80 - 97 fL   MCH, POC 29.6  27 - 31.2 pg   MCHC 32.9  31.8 - 35.4 g/dL   RDW, POC 82.9     Platelet Count, POC 218  142 - 424 K/uL   MPV 8.3  0 - 99.8 fL        Assessment & Plan:  Headache(784.0) - Plan: CT Head Wo Contrast  Acute pharyngitis - Plan: POCT rapid strep A, Culture, Group A Strep, POCT CBC  HTN (hypertension) - Plan: hydrochlorothiazide (MICROZIDE) 12.5 MG capsule  GERD (gastroesophageal  reflux disease) - Plan: esomeprazole (NEXIUM) 40 MG capsule  This HA is concerning, having woken her from sleep, and that it is unrelenting.  CT scan of the head now.  If it's negative, she'll go home and take another dose of acetaminophen.  Her nausea is likely due to untreated GERD, so I've added esomeprazole to her regimen.  Additionally, her HTN is uncontrolled (possibly due to her HA, though the HA may be exacerbated by the HTN), as she ran out of her medication last week.  Refills sent to her pharmacy.  Will arrange follow-up once we see her scan results, but I recommend that the return for re-evaluation of the HTN in 3-4 weeks.  She should RTC in the next 3-5 days if her HA persists. Fernande Bras, PA-C Physician Assistant-Certified [facility]

## 2012-07-03 NOTE — Telephone Encounter (Signed)
See result note regarding this patient's scan.

## 2012-07-05 LAB — CULTURE, GROUP A STREP: Organism ID, Bacteria: NORMAL

## 2012-11-19 ENCOUNTER — Other Ambulatory Visit: Payer: Self-pay

## 2013-01-26 ENCOUNTER — Other Ambulatory Visit: Payer: Self-pay | Admitting: Physician Assistant

## 2013-01-27 ENCOUNTER — Other Ambulatory Visit: Payer: Self-pay | Admitting: Physician Assistant

## 2013-01-27 NOTE — Telephone Encounter (Signed)
Needs office visit.

## 2013-03-05 ENCOUNTER — Ambulatory Visit (INDEPENDENT_AMBULATORY_CARE_PROVIDER_SITE_OTHER): Payer: BC Managed Care – PPO | Admitting: Internal Medicine

## 2013-03-05 VITALS — BP 144/80 | HR 96 | Temp 98.5°F | Resp 18 | Ht 65.0 in | Wt 250.0 lb

## 2013-03-05 DIAGNOSIS — Z5181 Encounter for therapeutic drug level monitoring: Secondary | ICD-10-CM

## 2013-03-05 DIAGNOSIS — J40 Bronchitis, not specified as acute or chronic: Secondary | ICD-10-CM

## 2013-03-05 DIAGNOSIS — J329 Chronic sinusitis, unspecified: Secondary | ICD-10-CM

## 2013-03-05 DIAGNOSIS — I10 Essential (primary) hypertension: Secondary | ICD-10-CM

## 2013-03-05 LAB — COMPREHENSIVE METABOLIC PANEL WITH GFR
ALT: 36 U/L — ABNORMAL HIGH (ref 0–35)
AST: 18 U/L (ref 0–37)
Albumin: 4.1 g/dL (ref 3.5–5.2)
Alkaline Phosphatase: 96 U/L (ref 39–117)
BUN: 13 mg/dL (ref 6–23)
CO2: 27 meq/L (ref 19–32)
Calcium: 8.8 mg/dL (ref 8.4–10.5)
Chloride: 100 meq/L (ref 96–112)
Creat: 0.76 mg/dL (ref 0.50–1.10)
Glucose, Bld: 112 mg/dL — ABNORMAL HIGH (ref 70–99)
Potassium: 3.9 meq/L (ref 3.5–5.3)
Sodium: 137 meq/L (ref 135–145)
Total Bilirubin: 0.3 mg/dL (ref 0.2–1.2)
Total Protein: 6.8 g/dL (ref 6.0–8.3)

## 2013-03-05 LAB — LIPID PANEL
Cholesterol: 124 mg/dL (ref 0–200)
HDL: 49 mg/dL (ref >39)
LDL Cholesterol: 50 mg/dL (ref 0–99)
TRIGLYCERIDES: 126 mg/dL (ref <150)
Total CHOL/HDL Ratio: 2.5 Ratio
VLDL: 25 mg/dL (ref 0–40)

## 2013-03-05 LAB — TSH: TSH: 3.71 u[IU]/mL (ref 0.350–4.500)

## 2013-03-05 MED ORDER — HYDROCODONE-ACETAMINOPHEN 7.5-325 MG/15ML PO SOLN
5.0000 mL | Freq: Four times a day (QID) | ORAL | Status: DC | PRN
Start: 2013-03-05 — End: 2014-12-01

## 2013-03-05 MED ORDER — HYDROCHLOROTHIAZIDE 12.5 MG PO CAPS: 12.5000 mg | ORAL_CAPSULE | Freq: Every day | ORAL | Status: DC

## 2013-03-05 MED ORDER — AZITHROMYCIN 500 MG PO TABS: 500.0000 mg | ORAL_TABLET | Freq: Every day | ORAL | Status: DC

## 2013-03-05 NOTE — Progress Notes (Signed)
   Subjective:    Patient ID: Catherine Trevino, female    DOB: May 31, 1960, 53 y.o.   MRN: [id number]  HPI Catherine Trevino presents to clinic today stating she has a cough and congestion for a bout a week. She has taken ibprophen, nyquil, dayquil. She also has chills, headache and minor ear ache. She denies body ache. States she had flu in January. She complains of chest pains with her cough. She also denies any mucus produced with her cough. She  Has chest pain anterior with cough, dry, no sputum.  No sob, dyspnea, wheezing, asthma, and does not smoke.   Review of Systems     Objective:   Physical Exam  Constitutional: She is oriented to person, place, and time. She appears well-developed and well-nourished.  HENT:  Head: Normocephalic.  Right Ear: External ear normal.  Left Ear: External ear normal.  Nose: Mucosal edema, rhinorrhea and sinus tenderness present. No epistaxis. Right sinus exhibits maxillary sinus tenderness. Right sinus exhibits no frontal sinus tenderness. Left sinus exhibits maxillary sinus tenderness. Left sinus exhibits no frontal sinus tenderness.  Mouth/Throat: Oropharynx is clear and moist.  Eyes: EOM are normal. Pupils are equal, round, and reactive to light.  Neck: Normal range of motion. Neck supple.  Cardiovascular: Normal rate.   Pulmonary/Chest: Effort normal and breath sounds normal. She exhibits tenderness.  Abdominal: Bowel sounds are normal.  Musculoskeletal: Normal range of motion.  Lymphadenopathy:    She has no cervical adenopathy.  Neurological: She is alert and oriented to person, place, and time. She exhibits normal muscle tone. Coordination normal.  Psychiatric: She has a normal mood and affect.   Results for orders placed in visit on 07/03/12  CULTURE, GROUP A STREP      Result Value Ref Range   Organism ID, Bacteria Normal Upper Respiratory Flora     Organism ID, Bacteria No Beta Hemolytic Streptococci Isolated    POCT RAPID STREP A (OFFICE)     Result Value Ref Range   Rapid Strep A Screen Negative  Negative  POCT CBC      Result Value Ref Range   WBC 7.4  4.6 - 10.2 K/uL   Lymph, poc 2.2  0.6 - 3.4   POC LYMPH PERCENT 30.1  10 - 50 %L   MID (cbc) 0.5  0 - 0.9   POC MID % 6.9  0 - 12 %M   POC Granulocyte 4.7  2 - 6.9   Granulocyte percent 63.0  37 - 80 %G   RBC 5.06  4.04 - 5.48 M/uL   Hemoglobin 15.0  12.2 - 16.2 g/dL   HCT, POC 45.6  37.7 - 47.9 %   MCV 90.1  80 - 97 fL   MCH, POC 29.6  27 - 31.2 pg   MCHC 32.9  31.8 - 35.4 g/dL   RDW, POC 14.1     Platelet Count, POC 218  142 - 424 K/uL   MPV 8.3  0 - 99.8 fL          Assessment & Plan:  Bronchitis/Sinusitis HTN/Med monitor--agrees to get a primary at 104  Zithromax/Lortab

## 2013-03-05 NOTE — Patient Instructions (Signed)
Bronchitis Bronchitis is inflammation of the airways that extend from the windpipe into the lungs (bronchi). The inflammation often causes mucus to develop, which leads to a cough. If the inflammation becomes severe, it may cause shortness of breath. CAUSES  Bronchitis may be caused by:   Viral infections.   Bacteria.   Cigarette smoke.   Allergens, pollutants, and other irritants.  SIGNS AND SYMPTOMS  The most common symptom of bronchitis is a frequent cough that produces mucus. Other symptoms include:  Fever.   Body aches.   Chest congestion.   Chills.   Shortness of breath.   Sore throat.  DIAGNOSIS  Bronchitis is usually diagnosed through a medical history and physical exam. Tests, such as chest X-rays, are sometimes done to rule out other conditions.  TREATMENT  You may need to avoid contact with whatever caused the problem (smoking, for example). Medicines are sometimes needed. These may include:  Antibiotics. These may be prescribed if the condition is caused by bacteria.  Cough suppressants. These may be prescribed for relief of cough symptoms.   Inhaled medicines. These may be prescribed to help open your airways and make it easier for you to breathe.   Steroid medicines. These may be prescribed for those with recurrent (chronic) bronchitis. HOME CARE INSTRUCTIONS  Get plenty of rest.   Drink enough fluids to keep your urine clear or pale yellow (unless you have a medical condition that requires fluid restriction). Increasing fluids may help thin your secretions and will prevent dehydration.   Only take over-the-counter or prescription medicines as directed by your health care provider.  Only take antibiotics as directed. Make sure you finish them even if you start to feel better.  Avoid secondhand smoke, irritating chemicals, and strong fumes. These will make bronchitis worse. If you are a smoker, quit smoking. Consider using nicotine gum or  skin patches to help control withdrawal symptoms. Quitting smoking will help your lungs heal faster.   Put a cool-mist humidifier in your bedroom at night to moisten the air. This may help loosen mucus. Change the water in the humidifier daily. You can also run the hot water in your shower and sit in the bathroom with the door closed for 5 10 minutes.   Follow up with your health care provider as directed.   Wash your hands frequently to avoid catching bronchitis again or spreading an infection to others.  SEEK MEDICAL CARE IF: Your symptoms do not improve after 1 week of treatment.  SEEK IMMEDIATE MEDICAL CARE IF:  Your fever increases.  You have chills.   You have chest pain.   You have worsening shortness of breath.   You have bloody sputum.  You faint.  You have lightheadedness.  You have a severe headache.   You vomit repeatedly. MAKE SURE YOU:   Understand these instructions.  Will watch your condition.  Will get help right away if you are not doing well or get worse. Document Released: 12/31/2004 Document Revised: 10/21/2012 Document Reviewed: 08/25/2012 ExitCare Patient Information 2014 [employer]. Sinusitis Sinusitis is redness, soreness, and swelling (inflammation) of the paranasal sinuses. Paranasal sinuses are air pockets within the bones of your face (beneath the eyes, the middle of the forehead, or above the eyes). In healthy paranasal sinuses, mucus is able to drain out, and air is able to circulate through them by way of your nose. However, when your paranasal sinuses are inflamed, mucus and air can become trapped. This can allow bacteria and other   germs to grow and cause infection. Sinusitis can develop quickly and last only a short time (acute) or continue over a long period (chronic). Sinusitis that lasts for more than 12 weeks is considered chronic.  CAUSES  Causes of sinusitis include:  Allergies.  Structural abnormalities, such as  displacement of the cartilage that separates your nostrils (deviated septum), which can decrease the air flow through your nose and sinuses and affect sinus drainage.  Functional abnormalities, such as when the small hairs (cilia) that line your sinuses and help remove mucus do not work properly or are not present. SYMPTOMS  Symptoms of acute and chronic sinusitis are the same. The primary symptoms are pain and pressure around the affected sinuses. Other symptoms include:  Upper toothache.  Earache.  Headache.  Bad breath.  Decreased sense of smell and taste.  A cough, which worsens when you are lying flat.  Fatigue.  Fever.  Thick drainage from your nose, which often is green and may contain pus (purulent).  Swelling and warmth over the affected sinuses. DIAGNOSIS  Your caregiver will perform a physical exam. During the exam, your caregiver may:  Look in your nose for signs of abnormal growths in your nostrils (nasal polyps).  Tap over the affected sinus to check for signs of infection.  View the inside of your sinuses (endoscopy) with a special imaging device with a light attached (endoscope), which is inserted into your sinuses. If your caregiver suspects that you have chronic sinusitis, one or more of the following tests may be recommended:  Allergy tests.  Nasal culture A sample of mucus is taken from your nose and sent to a lab and screened for bacteria.  Nasal cytology A sample of mucus is taken from your nose and examined by your caregiver to determine if your sinusitis is related to an allergy. TREATMENT  Most cases of acute sinusitis are related to a viral infection and will resolve on their own within 10 days. Sometimes medicines are prescribed to help relieve symptoms (pain medicine, decongestants, nasal steroid sprays, or saline sprays).  However, for sinusitis related to a bacterial infection, your caregiver will prescribe antibiotic medicines. These are  medicines that will help kill the bacteria causing the infection.  Rarely, sinusitis is caused by a fungal infection. In theses cases, your caregiver will prescribe antifungal medicine. For some cases of chronic sinusitis, surgery is needed. Generally, these are cases in which sinusitis recurs more than 3 times per year, despite other treatments. HOME CARE INSTRUCTIONS   Drink plenty of water. Water helps thin the mucus so your sinuses can drain more easily.  Use a humidifier.  Inhale steam 3 to 4 times a day (for example, sit in the bathroom with the shower running).  Apply a warm, moist washcloth to your face 3 to 4 times a day, or as directed by your caregiver.  Use saline nasal sprays to help moisten and clean your sinuses.  Take over-the-counter or prescription medicines for pain, discomfort, or fever only as directed by your caregiver. SEEK IMMEDIATE MEDICAL CARE IF:  You have increasing pain or severe headaches.  You have nausea, vomiting, or drowsiness.  You have swelling around your face.  You have vision problems.  You have a stiff neck.  You have difficulty breathing. MAKE SURE YOU:   Understand these instructions.  Will watch your condition.  Will get help right away if you are not doing well or get worse. Document Released: 12/31/2004 Document Revised: 03/25/2011 Document Reviewed:   01/15/2011 ExitCare Patient Information 2014 [employer].  

## 2013-06-04 ENCOUNTER — Other Ambulatory Visit: Payer: Self-pay

## 2013-06-04 DIAGNOSIS — I10 Essential (primary) hypertension: Secondary | ICD-10-CM

## 2013-06-04 DIAGNOSIS — Z5181 Encounter for therapeutic drug level monitoring: Secondary | ICD-10-CM

## 2013-06-04 MED ORDER — HYDROCHLOROTHIAZIDE 12.5 MG PO CAPS: 12.5000 mg | ORAL_CAPSULE | Freq: Every day | ORAL | Status: DC

## 2013-06-13 ENCOUNTER — Other Ambulatory Visit: Payer: Self-pay | Admitting: Physician Assistant

## 2014-02-06 ENCOUNTER — Other Ambulatory Visit: Payer: Self-pay | Admitting: Internal Medicine

## 2014-10-05 ENCOUNTER — Other Ambulatory Visit: Payer: Self-pay | Admitting: Physician Assistant

## 2014-10-05 ENCOUNTER — Other Ambulatory Visit: Payer: Self-pay

## 2014-10-05 DIAGNOSIS — Z1231 Encounter for screening mammogram for malignant neoplasm of breast: Secondary | ICD-10-CM

## 2014-10-06 ENCOUNTER — Ambulatory Visit
Admission: RE | Admit: 2014-10-06 | Discharge: 2014-10-06 | Disposition: A | Discharge status: Home / Self Care | Payer: BC Managed Care – PPO | Source: Ambulatory Visit

## 2014-10-06 ENCOUNTER — Ambulatory Visit: Payer: BC Managed Care – PPO

## 2014-10-06 ENCOUNTER — Other Ambulatory Visit: Payer: Self-pay | Admitting: Emergency Medicine

## 2014-10-06 ENCOUNTER — Other Ambulatory Visit: Payer: Self-pay

## 2014-10-06 DIAGNOSIS — Z1231 Encounter for screening mammogram for malignant neoplasm of breast: Secondary | ICD-10-CM

## 2014-10-06 MED ORDER — HYDROCHLOROTHIAZIDE 12.5 MG PO CAPS: ORAL_CAPSULE | ORAL | Status: DC

## 2014-10-06 NOTE — Addendum Note (Signed)
Addended by: Jannette Spanner on: 10/06/2014 10:41 AM   Modules accepted: Orders

## 2014-10-06 NOTE — Addendum Note (Signed)
Addended by: Jannette Spanner on: 10/06/2014 11:24 AM   Modules accepted: Orders

## 2014-10-18 ENCOUNTER — Encounter: Payer: Self-pay | Admitting: Emergency Medicine

## 2014-10-25 ENCOUNTER — Encounter: Payer: Self-pay | Admitting: Physician Assistant

## 2014-10-25 ENCOUNTER — Ambulatory Visit (INDEPENDENT_AMBULATORY_CARE_PROVIDER_SITE_OTHER): Payer: BC Managed Care – PPO | Admitting: Physician Assistant

## 2014-10-25 VITALS — BP 141/85 | HR 97 | Temp 99.0°F | Resp 16 | Ht 65.0 in | Wt 268.4 lb

## 2014-10-25 DIAGNOSIS — I10 Essential (primary) hypertension: Secondary | ICD-10-CM | POA: Diagnosis not present

## 2014-10-25 DIAGNOSIS — Z114 Encounter for screening for human immunodeficiency virus [HIV]: Secondary | ICD-10-CM

## 2014-10-25 DIAGNOSIS — R739 Hyperglycemia, unspecified: Secondary | ICD-10-CM

## 2014-10-25 DIAGNOSIS — Z6839 Body mass index (BMI) 39.0-39.9, adult: Secondary | ICD-10-CM | POA: Insufficient documentation

## 2014-10-25 DIAGNOSIS — Z1231 Encounter for screening mammogram for malignant neoplasm of breast: Secondary | ICD-10-CM | POA: Diagnosis not present

## 2014-10-25 DIAGNOSIS — Z1159 Encounter for screening for other viral diseases: Secondary | ICD-10-CM | POA: Diagnosis not present

## 2014-10-25 DIAGNOSIS — Z13220 Encounter for screening for lipoid disorders: Secondary | ICD-10-CM

## 2014-10-25 DIAGNOSIS — Z0000 Encounter for general adult medical examination without abnormal findings: Secondary | ICD-10-CM

## 2014-10-25 DIAGNOSIS — Z13228 Encounter for screening for other metabolic disorders: Secondary | ICD-10-CM | POA: Diagnosis not present

## 2014-10-25 DIAGNOSIS — Z130 Encounter for screening for diseases of the blood and blood-forming organs and certain disorders involving the immune mechanism: Secondary | ICD-10-CM | POA: Diagnosis not present

## 2014-10-25 DIAGNOSIS — Z1211 Encounter for screening for malignant neoplasm of colon: Secondary | ICD-10-CM | POA: Diagnosis not present

## 2014-10-25 DIAGNOSIS — Z23 Encounter for immunization: Secondary | ICD-10-CM

## 2014-10-25 DIAGNOSIS — Z6841 Body Mass Index (BMI) 40.0 and over, adult: Secondary | ICD-10-CM | POA: Diagnosis not present

## 2014-10-25 DIAGNOSIS — Z1329 Encounter for screening for other suspected endocrine disorder: Secondary | ICD-10-CM

## 2014-10-25 DIAGNOSIS — R748 Abnormal levels of other serum enzymes: Secondary | ICD-10-CM

## 2014-10-25 LAB — COMPREHENSIVE METABOLIC PANEL
ALBUMIN: 4.2 g/dL (ref 3.6–5.1)
ALT: 77 U/L — ABNORMAL HIGH (ref 6–29)
AST: 42 U/L — ABNORMAL HIGH (ref 10–35)
Alkaline Phosphatase: 111 U/L (ref 33–130)
BUN: 13 mg/dL (ref 7–25)
CHLORIDE: 100 mmol/L (ref 98–110)
CO2: 30 mmol/L (ref 20–31)
CREATININE: 0.92 mg/dL (ref 0.50–1.05)
Calcium: 9.1 mg/dL (ref 8.6–10.4)
Glucose, Bld: 126 mg/dL — ABNORMAL HIGH (ref 65–99)
POTASSIUM: 4.5 mmol/L (ref 3.5–5.3)
SODIUM: 136 mmol/L (ref 135–146)
TOTAL PROTEIN: 6.9 g/dL (ref 6.1–8.1)
Total Bilirubin: 0.7 mg/dL (ref 0.2–1.2)

## 2014-10-25 LAB — CBC WITH DIFFERENTIAL/PLATELET
BASOS PCT: 0 % (ref 0–1)
Basophils Absolute: 0 10*3/uL (ref 0.0–0.1)
EOS ABS: 0.1 10*3/uL (ref 0.0–0.7)
Eosinophils Relative: 2 % (ref 0–5)
HCT: 42.7 % (ref 36.0–46.0)
HEMOGLOBIN: 15.2 g/dL — AB (ref 12.0–15.0)
LYMPHS ABS: 1.8 10*3/uL (ref 0.7–4.0)
Lymphocytes Relative: 26 % (ref 12–46)
MCH: 29.5 pg (ref 26.0–34.0)
MCHC: 35.6 g/dL (ref 30.0–36.0)
MCV: 82.8 fL (ref 78.0–100.0)
MONO ABS: 0.6 10*3/uL (ref 0.1–1.0)
MONOS PCT: 8 % (ref 3–12)
MPV: 10 fL (ref 8.6–12.4)
Neutro Abs: 4.5 10*3/uL (ref 1.7–7.7)
Neutrophils Relative %: 64 % (ref 43–77)
PLATELETS: 205 10*3/uL (ref 150–400)
RBC: 5.16 MIL/uL — ABNORMAL HIGH (ref 3.87–5.11)
RDW: 14.1 % (ref 11.5–15.5)
WBC: 7.1 10*3/uL (ref 4.0–10.5)

## 2014-10-25 LAB — LIPID PANEL
CHOL/HDL RATIO: 3.5 ratio (ref <=5.0)
CHOLESTEROL: 133 mg/dL (ref 125–200)
HDL: 38 mg/dL — AB (ref >=46)
LDL Cholesterol: 66 mg/dL (ref <130)
TRIGLYCERIDES: 145 mg/dL (ref <150)
VLDL: 29 mg/dL (ref <30)

## 2014-10-25 LAB — TSH: TSH: 4.267 u[IU]/mL (ref 0.350–4.500)

## 2014-10-25 MED ORDER — LISINOPRIL-HYDROCHLOROTHIAZIDE 10-12.5 MG PO TABS: 1.0000 | ORAL_TABLET | Freq: Every day | ORAL | Status: DC

## 2014-10-25 NOTE — Progress Notes (Signed)
Patient ID: Catherine Trevino, female    DOB: 1960/02/05, 54 y.o.   MRN: [id number]  PCP: Jenny Reichmann, MD  Chief Complaint  Patient presents with  . Annual Exam    pap smear    Subjective:   HPI: Patient presents today for her annual exam.   She has a hx of HTN and GERD. She has managed her reflux well with diet and doesn't need the Nexium.   She has been on a diet program where she has been cutting out flour and sugar. She eats lots of vegetables and has been trying to exercise. The diet program is more of a lifestyle change and comes along with a Facebook group where she can have support from people. She has lost 8 pounds since starting the program 2 weeks ago.   She takes HCTZ for blood pressure control. She checks her BP at home and it usually runs around 341D systolic. No HA, chest pain, shortness of breath, or visual disturbances.   Of note, patient had a lipoma removed from her right forehead this morning at her dermatologist's office.   She believes that she needs a pap test for cervical cancer screening. However, as she had a hysterectomy for non-cancerous reasons (she had uterine fibroids), she is no longer a candidate for cervical cancer screening.       Patient Active Problem List   Diagnosis Date Noted  . HTN (hypertension) 07/03/2012  . GERD (gastroesophageal reflux disease) 07/03/2012  . 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 GERD (gastroesophageal reflux disease)   . Hypertension      Prior to Admission medications   Medication Sig Start Date End Date Taking? Authorizing Provider  hydrochlorothiazide (MICROZIDE) 12.5 MG capsule TAKE 1 CAPSULE DAILY  "NEEDS OFFICE VISIT FOR ADDITIONAL REFILLS" 10/06/14  Yes Darlyne Russian, MD  HYDROcodone-acetaminophen (HYCET) 7.5-325 mg/15 ml solution Take 5 mLs by mouth every 6 (six) hours as needed (or cough). 03/05/13  Yes Orma Flaming, MD  esomeprazole  (NEXIUM) 40 MG capsule Take 1 capsule (40 mg total) by mouth daily before breakfast. Patient not taking: Reported on 10/25/2014 07/03/12   Harrison Mons, PA-C    No Known Allergies  Past Surgical History  Procedure Laterality Date  . Abdominal hysterectomy    . Tubal ligation      Family History  Problem Relation Age of Onset  . Hypertension Father   . Emphysema Maternal Grandmother   . Cancer Maternal Grandfather   . Hypertension Paternal Grandmother   . Hypertension Paternal Grandfather     Social History   Social History  . Marital Status: Married    Spouse Name: N/A  . Number of Children: 3  . Years of Education: N/A   Occupational History  . Sports coach; [employer], retail)   Social History Main Topics  . Smoking status: Never Smoker   . Smokeless tobacco: Never Used  . Alcohol Use: No  . Drug Use: No  . Sexual Activity: Not Asked   Other Topics Concern  . None   Social History Narrative   Lives with her husband.       Review of Systems Constitutional: Negative for fever and chills.  HENT: Negative.  Eyes: Negative for photophobia and visual disturbance.  Respiratory: Negative for chest tightness and shortness of breath.  Cardiovascular: Negative for chest pain.  Gastrointestinal: Negative for nausea, vomiting, abdominal  pain, diarrhea and constipation.  Genitourinary: Negative for dysuria, urgency and frequency.  Musculoskeletal: Negative for myalgias and arthralgias.  Skin: Negative for rash.  Neurological: Negative for dizziness, syncope, light-headedness and headaches.  Psychiatric/Behavioral: Negative.       Objective:  Physical Exam  Constitutional: She is oriented to person, place, and time. Vital signs are normal. She appears well-developed and well-nourished. She is active and cooperative. No distress.  BP 141/85 mmHg  Pulse 97  Temp(Src) 99 F (37.2 C) (Oral)  Resp 16  Ht 5\' 5"  (1.651 m)  Wt 268 lb 6.4 oz  (121.745 kg)  BMI 44.66 kg/m2   HENT:  Head: Normocephalic and atraumatic.  Right Ear: Hearing, tympanic membrane, external ear and ear canal normal. No foreign bodies.  Left Ear: Hearing, tympanic membrane, external ear and ear canal normal. No foreign bodies.  Nose: Nose normal.  Mouth/Throat: Uvula is midline, oropharynx is clear and moist and mucous membranes are normal. No oral lesions. Normal dentition. No dental abscesses or uvula swelling. No oropharyngeal exudate.  Eyes: Conjunctivae, EOM and lids are normal. Pupils are equal, round, and reactive to light. Right eye exhibits no discharge. Left eye exhibits no discharge. No scleral icterus.  Fundoscopic exam:      The right eye shows no arteriolar narrowing, no AV nicking, no exudate, no hemorrhage and no papilledema. The right eye shows red reflex.       The left eye shows no arteriolar narrowing, no AV nicking, no exudate, no hemorrhage and no papilledema. The left eye shows red reflex.  Neck: Trachea normal, normal range of motion and full passive range of motion without pain. Neck supple. No spinous process tenderness and no muscular tenderness present. No thyroid mass and no thyromegaly present.  Cardiovascular: Normal rate, regular rhythm, normal heart sounds, intact distal pulses and normal pulses.   Pulmonary/Chest: Effort normal and breath sounds normal.  Genitourinary: No breast swelling, tenderness, discharge or bleeding. Pelvic exam was performed with patient supine. No labial fusion. There is no rash, tenderness, lesion or injury on the right labia. There is no rash, tenderness, lesion or injury on the left labia.  Cervix is surgically absent.  Musculoskeletal: She exhibits no edema or tenderness.       Cervical back: Normal.       Thoracic back: Normal.       Lumbar back: Normal.  Lymphadenopathy:       Head (right side): No tonsillar, no preauricular, no posterior auricular and no occipital adenopathy present.        Head (left side): No tonsillar, no preauricular, no posterior auricular and no occipital adenopathy present.    She has no cervical adenopathy.       Right: No supraclavicular adenopathy present.       Left: No supraclavicular adenopathy present.  Neurological: She is alert and oriented to person, place, and time. She has normal strength and normal reflexes. No cranial nerve deficit. She exhibits normal muscle tone. Coordination and gait normal.  Skin: Skin is warm, dry and intact. No rash noted. She is not diaphoretic. No cyanosis or erythema. Nails show no clubbing.  Psychiatric: She has a normal mood and affect. Her speech is normal and behavior is normal. Judgment and thought content normal.           Assessment & Plan:  1. Annual physical exam Age appropriate anticipatory guidance provided.  2. Screening for colon cancer - Ambulatory referral to Gastroenterology  3. Visit for screening  mammogram - MM Digital Screening; Future  4. Screening for HIV (human immunodeficiency virus) - HIV antibody  5. Need for hepatitis C screening test - Hepatitis C antibody  6. Screening for deficiency anemia - CBC with Differential/Platelet  7. Screening for hyperlipidemia - Lipid panel  8. Screening for metabolic disorder - Comprehensive metabolic panel  9. Screening for thyroid disorder - TSH  10. Need for influenza vaccination - Flu Vaccine QUAD 36+ mos IM  11. Need for Tdap vaccination - Tdap vaccine greater than or equal to 7yo IM  12. Essential hypertension D/C HCTZ. Start LisinoprilHCT. Continue checking BP at home and follow-up with me or Dr. Everlene Farrier in 3 months. - lisinopril-hydrochlorothiazide (PRINZIDE,ZESTORETIC) 10-12.5 MG tablet; Take 1 tablet by mouth daily.  Dispense: 90 tablet; Refill: 3  13. BMI 40.0-44.9, adult ([place]) Continue healthier eating habits and work to increase cardiovascular exercise.   Fara Chute, PA-C Physician Assistant-Certified [facility]

## 2014-10-25 NOTE — Progress Notes (Signed)
Subjective:     Patient ID: Catherine Trevino, female   DOB: 1960/10/15, 54 y.o.   MRN: [id number]  PCP: Jenny Reichmann, MD  Chief Complaint  Patient presents with  . Annual Exam    pap smear    HPI Patient presents today for her annual exam.   She has a hx of HTN and GERD. She has managed her reflux well with diet and doesn't need the Nexium.   She has been on a diet program where she has been cutting out flour and sugar. She eats lots of vegetables and has been trying to exercise. The diet program is more of a lifestyle change and comes along with a Facebook group where she can have support from people. She has lost 8 pounds since starting the program 2 weeks ago.   She takes HCTZ for blood pressure control. She checks her BP at home and it usually runs around 314H systolic. No HA, chest pain, shortness of breath, or visual disturbances.  Of note, patient had a lipoma removed from her right forehead this morning at her dermatologist's office.    Review of Systems  Constitutional: Negative for fever and chills.  HENT: Negative.   Eyes: Negative for photophobia and visual disturbance.  Respiratory: Negative for chest tightness and shortness of breath.   Cardiovascular: Negative for chest pain.  Gastrointestinal: Negative for nausea, vomiting, abdominal pain, diarrhea and constipation.  Genitourinary: Negative for dysuria, urgency and frequency.  Musculoskeletal: Negative for myalgias and arthralgias.  Skin: Negative for rash.  Neurological: Negative for dizziness, syncope, light-headedness and headaches.  Psychiatric/Behavioral: Negative.      Patient Active Problem List   Diagnosis Date Noted  . HTN (hypertension) 07/03/2012  . GERD (gastroesophageal reflux disease) 07/03/2012  . 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 Marital Status: Married    Spouse Name: N/A  . Number of Children: 3  . Years of Education: N/A    Occupational History  . Sports coach; [employer], retail)   Social History Main Topics  . Smoking status: Never Smoker   . Smokeless tobacco: Never Used  . Alcohol Use: No  . Drug Use: No  . Sexual Activity: Not on file   Other Topics Concern  . Not on file   Social History Narrative   Lives with her husband.     Prior to Admission medications   Medication Sig Start Date End Date Taking? Authorizing Provider  hydrochlorothiazide (MICROZIDE) 12.5 MG capsule TAKE 1 CAPSULE DAILY  "NEEDS OFFICE VISIT FOR ADDITIONAL REFILLS" 10/06/14  Yes Darlyne Russian, MD  HYDROcodone-acetaminophen (HYCET) 7.5-325 mg/15 ml solution Take 5 mLs by mouth every 6 (six) hours as needed (or cough). 03/05/13  Yes Orma Flaming, MD  esomeprazole (NEXIUM) 40 MG capsule Take 1 capsule (40 mg total) by mouth daily before breakfast. Patient not taking: Reported on 10/25/2014 07/03/12   Harrison Mons, PA-C    No Known Allergies  Objective:  Physical Exam  Constitutional: She is oriented to person, place, and time. She appears well-developed and well-nourished.  HENT:  Head: Normocephalic and atraumatic.  Right Ear: Hearing, tympanic membrane, external ear and ear canal normal.  Left Ear: Hearing, tympanic membrane, external ear and ear canal normal.  Nose: Nose normal.  Mouth/Throat: Uvula is midline, oropharynx is clear and moist and mucous membranes are normal.  Eyes: Conjunctivae are normal. Pupils are equal, round, and  reactive to light.  Neck: Normal range of motion. Neck supple. No thyromegaly present.  Cardiovascular: Normal rate and regular rhythm.   Pulmonary/Chest: Effort normal and breath sounds normal. Right breast exhibits no inverted nipple, no mass, no nipple discharge, no skin change and no tenderness. Left breast exhibits no inverted nipple, no mass, no nipple discharge, no skin change and no tenderness. Breasts are symmetrical.  Abdominal: Soft. Bowel sounds are  normal.  Genitourinary: Vagina normal. No breast swelling, tenderness, discharge or bleeding. Pelvic exam was performed with patient supine. No labial fusion. There is no rash, tenderness, lesion or injury on the right labia. There is no rash, tenderness, lesion or injury on the left labia.  Patient has had a hysterectomy and does not have a cervix.  Lymphadenopathy:    She has no cervical adenopathy.  Neurological: She is alert and oriented to person, place, and time. She has normal reflexes.  Skin: Skin is warm and dry.  Psychiatric: She has a normal mood and affect. Her behavior is normal. Thought content normal.     BP 170/104 mmHg  Pulse 97  Temp(Src) 99 F (37.2 C) (Oral)  Resp 16  Ht 5\' 5"  (1.651 m)  Wt 268 lb 6.4 oz (121.745 kg)  BMI 44.66 kg/m2  Assessment & Plan:  1. Annual physical exam Anticipatory guidance.   2. Screening for colon cancer - Ambulatory referral to Gastroenterology  3. Visit for screening mammogram - MM Digital Screening; Future  4. Screening for HIV (human immunodeficiency virus) - HIV antibody  5. Need for hepatitis C screening test - Hepatitis C antibody  6. Screening for deficiency anemia - CBC with Differential/Platelet  7. Screening for hyperlipidemia - Lipid panel  8. Screening for metabolic disorder - Comprehensive metabolic panel  9. Screening for thyroid disorder - TSH  10. Need for influenza vaccination - Flu Vaccine QUAD 36+ mos IM  11. Need for Tdap vaccination - Tdap vaccine greater than or equal to 7yo IM  12. Essential hypertension Not well-controlled. Switch from HCTZ to Prinizide. Continue monitoring BP at home.  - lisinopril-hydrochlorothiazide (PRINZIDE,ZESTORETIC) 10-12.5 MG tablet; Take 1 tablet by mouth daily.  Dispense: 90 tablet; Refill: 3  13. BMI 40.0-44.9, adult [facility]) Continue the great work of eating a healthy diet and exercising.    Follow-up in 3-6 months.    Jaizon Deroos D. Race, PA-S Physician  Assistant [facility]

## 2014-10-25 NOTE — Patient Instructions (Signed)
I will contact you with your lab results as soon as they are available.   If you have not heard from me in 2 weeks, please contact me.  The fastest way to get your results is to register for My Chart (see the instructions on the last page of this printout).  Keeping You Healthy  Get These Tests  Blood Pressure- Have your blood pressure checked by your healthcare provider at least once a year.  Normal blood pressure is 120/80.  Weight- Have your body mass index (BMI) calculated to screen for obesity.  BMI is a measure of body fat based on height and weight.  You can calculate your own BMI at www.nhlbisupport.com/bmi/  Cholesterol- Have your cholesterol checked every year.  Diabetes- Have your blood sugar checked every year if you have high blood pressure, high cholesterol, a family history of diabetes or if you are overweight.  Pap Test - Have a pap test every 1 to 5 years if you have been sexually active.  If you are older than 65 and recent pap tests have been normal you may not need additional pap tests.  In addition, if you have had a hysterectomy  for benign disease additional pap tests are not necessary.  Mammogram-Yearly mammograms are essential for early detection of breast cancer  Screening for Colon Cancer- Colonoscopy starting at age 50. Screening may begin sooner depending on your family history and other health conditions.  Follow up colonoscopy as directed by your Gastroenterologist.  Screening for Osteoporosis- Screening begins at age 65 with bone density scanning, sooner if you are at higher risk for developing Osteoporosis.  Get these medicines  Calcium with Vitamin D- Your body requires 1200-1500 mg of Calcium a day and 800-1000 IU of Vitamin D a day.  You can only absorb 500 mg of Calcium at a time therefore Calcium must be taken in 2 or 3 separate doses throughout the day.  Hormones- Hormone therapy has been associated with increased risk for certain cancers and heart  disease.  Talk to your healthcare provider about if you need relief from menopausal symptoms.  Aspirin- Ask your healthcare provider about taking Aspirin to prevent Heart Disease and Stroke.  Get these Immuniztions  Flu shot- Every fall  Pneumonia shot- Once after the age of 65; if you are younger ask your healthcare provider if you need a pneumonia shot.  Tetanus- Every ten years.  Zostavax- Once after the age of 60 to prevent shingles.  Take these steps  Don't smoke- Your healthcare provider can help you quit. For tips on how to quit, ask your healthcare provider or go to www.smokefree.gov or call 1-800 QUIT-NOW.  Be physically active- Exercise 5 days a week for a minimum of 30 minutes.  If you are not already physically active, start slow and gradually work up to 30 minutes of moderate physical activity.  Try walking, dancing, bike riding, swimming, etc.  Eat a healthy diet- Eat a variety of healthy foods such as fruits, vegetables, whole grains, low fat milk, low fat cheeses, yogurt, lean meats, chicken, fish, eggs, dried beans, tofu, etc.  For more information go to www.thenutritionsource.org  Dental visit- Brush and floss teeth twice daily; visit your dentist twice a year.  Eye exam- Visit your Optometrist or Ophthalmologist yearly.  Drink alcohol in moderation- Limit alcohol intake to one drink or less a day.  Never drink and drive.  Depression- Your emotional health is as important as your physical health.  If you're   feeling down or losing interest in things you normally enjoy, please talk to your healthcare provider.  Seat Belts- can save your life; always wear one  Smoke/Carbon Monoxide detectors- These detectors need to be installed on the appropriate level of your home.  Replace batteries at least once a year.  Violence- If anyone is threatening or hurting you, please tell your healthcare provider.  Living Will/ Health care power of attorney- Discuss with your  healthcare provider and family. 

## 2014-10-26 LAB — HIV ANTIBODY (ROUTINE TESTING W REFLEX): HIV 1&2 Ab, 4th Generation: NONREACTIVE

## 2014-10-26 LAB — HEPATITIS C ANTIBODY: HCV AB: NEGATIVE

## 2014-10-27 ENCOUNTER — Encounter: Payer: Self-pay | Admitting: Internal Medicine

## 2014-10-28 NOTE — Addendum Note (Signed)
Addended by: Fara Chute on: 10/28/2014 11:23 AM   Modules accepted: Orders

## 2014-10-29 ENCOUNTER — Encounter: Payer: Self-pay | Admitting: Physician Assistant

## 2014-10-29 ENCOUNTER — Other Ambulatory Visit: Payer: BC Managed Care – PPO

## 2014-10-29 DIAGNOSIS — R7303 Prediabetes: Secondary | ICD-10-CM | POA: Insufficient documentation

## 2014-10-29 DIAGNOSIS — E739 Lactose intolerance, unspecified: Secondary | ICD-10-CM | POA: Diagnosis not present

## 2014-10-29 DIAGNOSIS — R748 Abnormal levels of other serum enzymes: Secondary | ICD-10-CM | POA: Diagnosis not present

## 2014-10-29 DIAGNOSIS — E1169 Type 2 diabetes mellitus with other specified complication: Secondary | ICD-10-CM | POA: Insufficient documentation

## 2014-10-29 LAB — HEPATITIS B SURFACE ANTIGEN: Hepatitis B Surface Ag: NEGATIVE

## 2014-10-29 LAB — HEPATITIS A ANTIBODY, IGM: Hep A IgM: NONREACTIVE

## 2014-10-29 LAB — POCT GLYCOSYLATED HEMOGLOBIN (HGB A1C): Hemoglobin A1C: 6.1

## 2014-10-29 LAB — GLUCOSE, POCT (MANUAL RESULT ENTRY): POC GLUCOSE: 122 mg/dL — AB (ref 70–99)

## 2014-10-29 LAB — HEMOGLOBIN A1C: HEMOGLOBIN A1C: 6.1 % — AB (ref 4.0–6.0)

## 2014-10-29 LAB — HEPATITIS B SURFACE ANTIBODY, QUANTITATIVE: HEPATITIS B-POST: 0 m[IU]/mL

## 2014-10-29 NOTE — Progress Notes (Signed)
Pt is here for lab work only. 

## 2014-11-11 ENCOUNTER — Encounter: Payer: Self-pay | Admitting: Family Medicine

## 2014-11-30 ENCOUNTER — Telehealth: Payer: Self-pay | Admitting: Family Medicine

## 2014-11-30 NOTE — Telephone Encounter (Signed)
Patient was seen for CPE 10/25/14 and Chelle changed her BP medication at that time. She was taking HCTZ 12.5mg  and was changed to Lisinopril/HCTZ 10/12.5mg . She has been checking her BP since she has started the new medication her BP readings have averaged 90-93/60-62. Today she checked it and it was 90/61. The only sxs she has noticed is that she feels extremely exhausted. She has gradually felt that way but over the past 5 days she has noticed more. She is not sure if from BP or medication. She was concerned and wanted to know if her BP was too low,  if medication needed an adjustment, or just continue taking? I discussed with Bennett Scrape PA-C and have patient go back to HCTZ 12.5 she was taking before. Schedule appt or f/u with Chelle in 1 week. In the meantime keep records of BP readings, if anything changes or she starts feeling worse, BP running to high or low RTC or go to ED. Patient advised and voiced understanding. She has HCTZ left so she will start back on that.

## 2014-12-01 ENCOUNTER — Ambulatory Visit (AMBULATORY_SURGERY_CENTER): Payer: Self-pay

## 2014-12-01 VITALS — Ht 65.0 in | Wt 269.0 lb

## 2014-12-01 DIAGNOSIS — Z1211 Encounter for screening for malignant neoplasm of colon: Secondary | ICD-10-CM

## 2014-12-01 NOTE — Progress Notes (Signed)
No egg or soy allergies Not on home 02 No previous anesthesia complications Emmi video emailed to [email or web address]. No diet or weight loss meds

## 2014-12-01 NOTE — Telephone Encounter (Signed)
I agree with advice given 

## 2014-12-14 ENCOUNTER — Encounter: Payer: Self-pay | Admitting: Internal Medicine

## 2014-12-22 ENCOUNTER — Encounter: Payer: Self-pay | Admitting: Internal Medicine

## 2014-12-22 ENCOUNTER — Ambulatory Visit (AMBULATORY_SURGERY_CENTER): Payer: BC Managed Care – PPO | Admitting: Internal Medicine

## 2014-12-22 VITALS — BP 114/58 | HR 67 | Temp 97.7°F | Resp 14 | Ht 65.0 in | Wt 269.0 lb

## 2014-12-22 DIAGNOSIS — D125 Benign neoplasm of sigmoid colon: Secondary | ICD-10-CM | POA: Diagnosis not present

## 2014-12-22 DIAGNOSIS — Z1211 Encounter for screening for malignant neoplasm of colon: Secondary | ICD-10-CM | POA: Diagnosis not present

## 2014-12-22 DIAGNOSIS — D120 Benign neoplasm of cecum: Secondary | ICD-10-CM

## 2014-12-22 MED ORDER — SODIUM CHLORIDE 0.9 % IV SOLN: 500.0000 mL | INTRAVENOUS | Status: DC

## 2014-12-22 NOTE — Patient Instructions (Addendum)
I found and removed two very small polyps that look benign. I will let you know pathology results and when to have another routine colonoscopy by mail.  You also have a condition called diverticulosis - common and not usually a problem. Please read the handout provided.  I appreciate the opportunity to care for you. Gatha Mayer, MD, FACG    YOU HAD AN ENDOSCOPIC PROCEDURE TODAY AT [facility]:   Refer to the procedure report that was given to you for any specific questions about what was found during the examination.  If the procedure report does not answer your questions, please call your gastroenterologist to clarify.  If you requested that your care partner not be given the details of your procedure findings, then the procedure report has been included in a sealed envelope for you to review at your convenience later.  YOU SHOULD EXPECT: Some feelings of bloating in the abdomen. Passage of more gas than usual.  Walking can help get rid of the air that was put into your GI tract during the procedure and reduce the bloating. If you had a lower endoscopy (such as a colonoscopy or flexible sigmoidoscopy) you may notice spotting of blood in your stool or on the toilet paper. If you underwent a bowel prep for your procedure, you may not have a normal bowel movement for a few days.  Please Note:  You might notice some irritation and congestion in your nose or some drainage.  This is from the oxygen used during your procedure.  There is no need for concern and it should clear up in a day or so.  SYMPTOMS TO REPORT IMMEDIATELY:   Following lower endoscopy (colonoscopy or flexible sigmoidoscopy):  Excessive amounts of blood in the stool  Significant tenderness or worsening of abdominal pains  Swelling of the abdomen that is new, acute  Fever of 100F or higher   For urgent or emergent issues, a gastroenterologist can be reached at any hour by calling [phone]  [phone].   DIET: Your first meal following the procedure should be a small meal and then it is ok to progress to your normal diet. Heavy or fried foods are harder to digest and may make you feel nauseous or bloated.  Likewise, meals heavy in dairy and vegetables can increase bloating.  Drink plenty of fluids but you should avoid alcoholic beverages for 24 hours.  ACTIVITY:  You should plan to take it easy for the rest of today and you should NOT DRIVE or use heavy machinery until tomorrow (because of the sedation medicines used during the test).    FOLLOW UP: Our staff will call the number listed on your records the next business day following your procedure to check on you and address any questions or concerns that you may have regarding the information given to you following your procedure. If we do not reach you, we will leave a message.  However, if you are feeling well and you are not experiencing any problems, there is no need to return our call.  We will assume that you have returned to your regular daily activities without incident.  If any biopsies were taken you will be contacted by phone or by letter within the next 1-3 weeks.  Please call us at [phone] if you have not heard about the biopsies in 3 weeks.    SIGNATURES/CONFIDENTIALITY: You and/or your care partner have signed paperwork which will be entered into your electronic medical  record.  These signatures attest to the fact that that the information above on your After Visit Summary has been reviewed and is understood.  Full responsibility of the confidentiality of this discharge information lies with you and/or your care-partner.    Handouts were given to your care partner on polyps and diverticulosis. You may resume your current medications today. Await biopsy results. Please call if any questions or concerns.

## 2014-12-22 NOTE — Progress Notes (Signed)
No problems noted in the recovery room. maw 

## 2014-12-22 NOTE — Progress Notes (Signed)
Report to PACU, RN, vss, BBS= Clear.  

## 2014-12-22 NOTE — Op Note (Signed)
[place]  [address]. [place], [id number]   COLONOSCOPY PROCEDURE REPORT  PATIENT: Catherine Trevino, Catherine Trevino  MR#: [id number] BIRTHDATE: Mar 29, 1960 , 54  yrs. old GENDER: female ENDOSCOPIST: Gatha Mayer, MD, [facility] PROCEDURE DATE:  12/22/2014 PROCEDURE:   Colonoscopy, screening and Colonoscopy with snare polypectomy First Screening Colonoscopy - Avg.  risk and is 50 yrs.  old or older Yes.  Prior Negative Screening - Now for repeat screening. N/A  History of Adenoma - Now for follow-up colonoscopy & has been > or = to 3 yrs.  N/A  Polyps removed today? Yes ASA CLASS:   Class III INDICATIONS:Screening for colonic neoplasia and Colorectal Neoplasm Risk Assessment for this procedure is average risk. MEDICATIONS: Monitored anesthesia care  DESCRIPTION OF PROCEDURE:   After the risks benefits and alternatives of the procedure were thoroughly explained, informed consent was obtained.  The digital rectal exam revealed no abnormalities of the rectum.   The LB [id number]  endoscope was introduced through the anus and advanced to the cecum, which was identified by both the appendix and ileocecal valve. No adverse events experienced.   The quality of the prep was good.  (MiraLax was used)  The instrument was then slowly withdrawn as the colon was fully examined. Estimated blood loss is zero unless otherwise noted in this procedure report.      COLON FINDINGS: Two sessile polyps ranging from 3 to 80mm in size were found in the sigmoid colon and at the cecum.  Polypectomies were performed with a cold snare.  The resection was complete, the polyp tissue was completely retrieved and sent to histology. There was mild diverticulosis noted in the sigmoid colon.   The examination was otherwise normal.  Retroflexed views revealed no abnormalities. The time to cecum = 1.3 Withdrawal time = 9.9   The scope was withdrawn and the procedure completed. COMPLICATIONS: There were  no immediate complications.  ENDOSCOPIC IMPRESSION: 1.   Two sessile polyps ranging from 3 to 74mm in size were found in the sigmoid colon and at the cecum; polypectomies were performed with a cold snare 2.   There was mild diverticulosis noted in the sigmoid colon 3.   The examination was otherwise normal  RECOMMENDATIONS: Timing of repeat colonoscopy will be determined by pathology findings.  eSigned:  Gatha Mayer, MD, [facility] 12/22/2014 2:50 PM   cc: The Patient and Dr. Nena Jordan

## 2014-12-22 NOTE — Progress Notes (Signed)
Called to room to assist during endoscopic procedure.  Patient ID and intended procedure confirmed with present staff. Received instructions for my participation in the procedure from the performing physician.  

## 2014-12-23 ENCOUNTER — Telehealth: Payer: Self-pay

## 2014-12-23 NOTE — Telephone Encounter (Signed)
Left vm message

## 2014-12-29 ENCOUNTER — Encounter: Payer: Self-pay | Admitting: Internal Medicine

## 2014-12-29 DIAGNOSIS — Z86010 Personal history of colonic polyps: Secondary | ICD-10-CM

## 2014-12-29 DIAGNOSIS — Z860101 Personal history of adenomatous and serrated colon polyps: Secondary | ICD-10-CM | POA: Insufficient documentation

## 2014-12-29 HISTORY — DX: Personal history of colonic polyps: Z86.010

## 2014-12-29 NOTE — Progress Notes (Signed)
Quick Note:  2 subcentimeter adenomas - repeat colonoscopy 2021-2 ______

## 2015-01-31 ENCOUNTER — Encounter: Payer: Self-pay | Admitting: Physician Assistant

## 2015-01-31 ENCOUNTER — Ambulatory Visit (INDEPENDENT_AMBULATORY_CARE_PROVIDER_SITE_OTHER): Payer: BC Managed Care – PPO | Admitting: Physician Assistant

## 2015-01-31 VITALS — BP 124/86 | HR 84 | Temp 99.4°F | Resp 16 | Ht 64.75 in | Wt 255.8 lb

## 2015-01-31 DIAGNOSIS — M7712 Lateral epicondylitis, left elbow: Secondary | ICD-10-CM | POA: Diagnosis not present

## 2015-01-31 DIAGNOSIS — I10 Essential (primary) hypertension: Secondary | ICD-10-CM

## 2015-01-31 DIAGNOSIS — R7303 Prediabetes: Secondary | ICD-10-CM

## 2015-01-31 DIAGNOSIS — L719 Rosacea, unspecified: Secondary | ICD-10-CM | POA: Insufficient documentation

## 2015-01-31 LAB — COMPREHENSIVE METABOLIC PANEL
ALBUMIN: 4.2 g/dL (ref 3.6–5.1)
ALK PHOS: 102 U/L (ref 33–130)
ALT: 56 U/L — AB (ref 6–29)
AST: 30 U/L (ref 10–35)
BILIRUBIN TOTAL: 0.5 mg/dL (ref 0.2–1.2)
BUN: 15 mg/dL (ref 7–25)
CALCIUM: 9.3 mg/dL (ref 8.6–10.4)
CO2: 27 mmol/L (ref 20–31)
Chloride: 102 mmol/L (ref 98–110)
Creat: 0.86 mg/dL (ref 0.50–1.05)
GLUCOSE: 110 mg/dL — AB (ref 65–99)
Potassium: 4 mmol/L (ref 3.5–5.3)
Sodium: 139 mmol/L (ref 135–146)
Total Protein: 6.8 g/dL (ref 6.1–8.1)

## 2015-01-31 LAB — LIPID PANEL
CHOLESTEROL: 153 mg/dL (ref 125–200)
HDL: 44 mg/dL — ABNORMAL LOW (ref >=46)
LDL Cholesterol: 78 mg/dL (ref <130)
Total CHOL/HDL Ratio: 3.5 Ratio (ref <=5.0)
Triglycerides: 156 mg/dL — ABNORMAL HIGH (ref <150)
VLDL: 31 mg/dL — AB (ref <30)

## 2015-01-31 NOTE — Patient Instructions (Signed)
I will contact you with your lab results as soon as they are available.   If you have not heard from me in 2 weeks, please contact me.  The fastest way to get your results is to register for My Chart (see the instructions on the last page of this printout).   

## 2015-01-31 NOTE — Progress Notes (Signed)
Patient ID: Catherine Trevino, female    DOB: 05-Feb-1960, 55 y.o.   MRN: [id number]  PCP: Jenny Reichmann, MD  Subjective:   Chief Complaint  Patient presents with  . Follow-up  . Hypertension  . Jerrye Bushy    pt is" managing it"    HPI Presents for evaluation of Hypertension.   The patient PMhx includes GERD, HTN, and Pre-DM w/out complication. When she was seen in October she had an elevated blood sugars, and developed an eating plan. She has lost 13lbs since her last visit. She eliminated sugar, flour and her diet consists of fruits, vegetables, chicken, and beef/fish on occasion. She measures out and weighs her food. She reports she has been able to stick to the diet, even through the holidays. She drinks coffee in the morning, and water throughout the rest of the day. She denies sugary drinks, artifical sweetener use or alcohol use. Her exercise plan consists of walking with the goal of 10,000 steps 3x/week and floor exercises 3x/week in which follow along with a DVD with [employer]. She states her motivation is drawn from seeing her weight decrease with the app she records on in her phone. She reports she has supportive shoes.   She denies any fatigue, visual changes, SOB, DOE, Chest pain, palpatations, heat or cold intolerance, polyphagia, polydipsia, polyuria, numbness or tingling in her extremities, or foot pain. Her last eye exam was in June or July of 2016 in which she received a dilated eye exam. She has her 6 month check-up with her dentist 02/06/15. She denies ever having a PNA vaccine.   She monitors her BP at home. She says it averages around 130/85. In the past month the highest she has seen is 154/72mmHg. She reports she hasn't seen a number this high since she started meditating.   Her GERD symptoms seem to have helped been controlled with her diet changes. She denies any episodes since starting the eating plan in October.  Additional complaints of pin-point pain of on her left lateral  epicondyle of the elbow.       Review of Systems  Constitutional: Negative for activity change, appetite change, fatigue and unexpected weight change.  HENT: Negative for congestion, dental problem, ear pain, hearing loss, mouth sores, postnasal drip, rhinorrhea, sneezing, sore throat, tinnitus and trouble swallowing.   Eyes: Negative for photophobia, pain, redness and visual disturbance.  Respiratory: Negative for cough, chest tightness and shortness of breath.   Cardiovascular: Negative for chest pain, palpitations and leg swelling.  Gastrointestinal: Negative for nausea, vomiting, abdominal pain, diarrhea, constipation and blood in stool.  Genitourinary: Negative for dysuria, urgency, frequency and hematuria.  Musculoskeletal: Positive for arthralgias (LEFT elbow; she is RIGHT hand dominant). Negative for myalgias, gait problem and neck stiffness.  Skin: Negative for rash.  Neurological: Negative for dizziness, speech difficulty, weakness, light-headedness, numbness and headaches.  Hematological: Negative for adenopathy.  Psychiatric/Behavioral: Negative for confusion and sleep disturbance. The patient is not nervous/anxious.        Patient Active Problem List   Diagnosis Date Noted  . Hx of adenomatous colonic polyps 12/29/2014  . Diabetes mellitus type 2, controlled, without complications ([place]) [id number]  . BMI 40.0-44.9, adult ([place]) 10/25/2014  . HTN (hypertension) 07/03/2012  . GERD (gastroesophageal reflux disease) 07/03/2012  . ANGIODYSPLASIA, INTESTINE, WITHOUT HEMORRHAGE 08/16/2009     Prior to Admission medications   Medication Sig Start Date End Date Taking? Authorizing Provider  Cholecalciferol (VITAMIN D-3 PO) Take by mouth.  Yes Historical Provider, MD  lisinopril-hydrochlorothiazide (PRINZIDE,ZESTORETIC) 10-12.5 MG tablet Take 1 tablet by mouth daily. Patient taking differently: Take 1 tablet by mouth daily. Just taking hctz for 1 week 10/25/14  Yes Marvis Bakken, PA-C  metroNIDAZOLE (METROCREAM) 0.75 % cream APP ON THE SKIN BID 10/13/14  Yes Historical Provider, MD  Milk Thistle 250 MG CAPS Take 250 mg by mouth daily. Reported on 01/31/2015    Historical Provider, MD     No Known Allergies     Objective:  Physical Exam  Constitutional: She is oriented to person, place, and time. Vital signs are normal. She appears well-developed and well-nourished. She is active and cooperative. No distress.  BP 124/86 mmHg  Pulse 84  Temp(Src) 99.4 F (37.4 C) (Oral)  Resp 16  Ht 5' 4.75" (1.645 m)  Wt 255 lb 12.8 oz (116.03 kg)  BMI 42.88 kg/m2  SpO2 98%  HENT:  Head: Normocephalic and atraumatic.  Right Ear: Hearing normal.  Left Ear: Hearing normal.  Eyes: Conjunctivae are normal. No scleral icterus.  Neck: Normal range of motion. Neck supple. No thyromegaly present.  Cardiovascular: Normal rate, regular rhythm and normal heart sounds.   Pulses:      Radial pulses are 2+ on the right side, and 2+ on the left side.  Pulmonary/Chest: Effort normal and breath sounds normal.  Musculoskeletal:       Left elbow: She exhibits normal range of motion, no swelling, no effusion, no deformity and no laceration. Tenderness found. Lateral epicondyle tenderness noted. No radial head, no medial epicondyle and no olecranon process tenderness noted.       Left wrist: Normal.       Left upper arm: Normal.       Left forearm: Normal.       Left hand: Normal.  Lymphadenopathy:       Head (right side): No tonsillar, no preauricular, no posterior auricular and no occipital adenopathy present.       Head (left side): No tonsillar, no preauricular, no posterior auricular and no occipital adenopathy present.    She has no cervical adenopathy.       Right: No supraclavicular adenopathy present.       Left: No supraclavicular adenopathy present.  Neurological: She is alert and oriented to person, place, and time. No sensory deficit.  Skin: Skin is warm, dry and  intact. No rash noted. No cyanosis or erythema. Nails show no clubbing.  Psychiatric: She has a normal mood and affect. Her speech is normal and behavior is normal.           Assessment & Plan:   1. Prediabetes Await labs. Anticipate normalization with the lifestyle changes she has made. - Comprehensive metabolic panel - Hemoglobin A1c - Lipid panel  2. Essential hypertension Controlled. - Comprehensive metabolic panel  3. Lateral epicondylitis of left elbow Trial of OTC elbow strap. Plan referral if symptoms persist.   Fara Chute, PA-C Physician Assistant-Certified [facility]

## 2015-01-31 NOTE — Progress Notes (Signed)
Subjective:    Patient ID: Catherine Trevino, female    DOB: April 06, 1960, 54 y.o.   MRN: [id number]  Chief Complaint  Patient presents with  . Follow-up  . Hypertension  . Jerrye Bushy    pt is" managing it"   HPI Patient presents today for a f/u on Hypertension. The patient PMhx includes GERD, HTN, and Pre-DM w/out complication. When she was seen in October she had an elevated blood sugars, and developed an eating plan. She has lost 13lbs since her last visit. She eliminated sugar, flour and her diet consists of fruits, vegetables, chicken, and beef/fish on occasion. She measures out and weighs her food. She reports she has been able to stick to the diet, even through the holidays. She drinks coffee in the morning, and water throughout the rest of the day. She denies sugary drinks, artifical sweetener use or alcohol use. Her exercise plan consists of walking with the goal of 10,000 steps 3x/week and floor exercises 3x/week in which follow along with a DVD with [employer]. She stats her motivation is drawn from seeing her weight decrease with the app she records on in her phone. She reports she has supportive shoes. She denies any fatigue, visual changes, SOB, DOE, Chest pain, palpatations, heat or cold intolerance, polyphagia, polydipsia, polyuria, numbness or tingling in her extremities, or foot pain. Her last eye exam was in June or July of 2016 in which she received a dilated eye exam. She has her 6 month check-up with her dentist 02/06/15. She denies ever having a PNA vaccine.   She monitors her BP at home. She says it averages around 130/85. In the past month the highest she has seen is 154/31mmHg. She reports she hasn't seen a number this high since she started meditating.   Her GERD symptoms seem to have helped been controlled with her diet changes. She denies any episodes since starting the eating plan in October.  Additional complaints of pin-point pain of on her left lateral epicondyle of the elbow.     Review of Systems  Constitutional: Negative for fatigue.  HENT: Negative.  Negative for sore throat.   Eyes: Negative for visual disturbance.  Respiratory: Negative for cough, chest tightness and shortness of breath.   Cardiovascular: Negative for chest pain and palpitations.  Gastrointestinal: Negative for nausea, vomiting, abdominal pain and diarrhea.  Endocrine: Negative for cold intolerance, heat intolerance, polydipsia, polyphagia and polyuria.  Genitourinary: Negative for frequency.  Musculoskeletal: Negative.   Skin: Negative.   Neurological: Negative for syncope, light-headedness and numbness.  Hematological: Does not bruise/bleed easily.  Psychiatric/Behavioral: Negative for confusion and decreased concentration.   No Known Allergies  Prior to Admission medications   Medication Sig Start Date End Date Taking? Authorizing Provider  Cholecalciferol (VITAMIN D-3 PO) Take by mouth.   Yes Historical Provider, MD  lisinopril-hydrochlorothiazide (PRINZIDE,ZESTORETIC) 10-12.5 MG tablet Take 1 tablet by mouth daily. Patient taking differently: Take 1 tablet by mouth daily. Just taking hctz for 1 week 10/25/14  Yes Chelle Jeffery, PA-C  metroNIDAZOLE (METROCREAM) 0.75 % cream APP ON THE SKIN BID 10/13/14  Yes Historical Provider, MD  Milk Thistle 250 MG CAPS Take 250 mg by mouth daily. Reported on 01/31/2015    Historical Provider, MD   Patient Active Problem List   Diagnosis Date Noted  . Rosacea 01/31/2015  . Hx of adenomatous colonic polyps 12/29/2014  . Prediabetes 10/29/2014  . BMI 40.0-44.9, adult ([place]) 10/25/2014  . HTN (hypertension) 07/03/2012  . GERD (gastroesophageal  reflux disease) 07/03/2012  . ANGIODYSPLASIA, INTESTINE, WITHOUT HEMORRHAGE 08/16/2009      Objective:   Physical Exam  Constitutional: She is oriented to person, place, and time. Vital signs are normal. She appears well-developed and well-nourished. No distress.  BP 124/86 mmHg  Pulse 84  Temp(Src)  99.4 F (37.4 C) (Oral)  Resp 16  Ht 5' 4.75" (1.645 m)  Wt 255 lb 12.8 oz (116.03 kg)  BMI 42.88 kg/m2  SpO2 98%  HENT:  Head: Normocephalic and atraumatic.  Mouth/Throat: Uvula is midline, oropharynx is clear and moist and mucous membranes are normal.  Eyes: Conjunctivae and EOM are normal. Pupils are equal, round, and reactive to light.  Fundoscopic exam:      The right eye shows no arteriolar narrowing, no AV nicking, no exudate, no hemorrhage and no papilledema. The right eye shows red reflex.       The left eye shows no arteriolar narrowing, no AV nicking, no exudate, no hemorrhage and no papilledema. The left eye shows red reflex.  Neck: Trachea normal and normal range of motion. Neck supple. No thyroid mass and no thyromegaly present.  Cardiovascular: Normal rate, regular rhythm, S1 normal, S2 normal, normal heart sounds and normal pulses.  Exam reveals no gallop and no friction rub.   No murmur heard. 2+ pulses in the popliteal, posterior tibialis, and dorsalis pedis.   Pulmonary/Chest: Effort normal and breath sounds normal.  Abdominal: Soft.  Musculoskeletal:       Right elbow: Normal.      Left elbow: She exhibits normal range of motion. Tenderness found. Lateral epicondyle tenderness noted.       Right wrist: Normal.       Left wrist: Normal.       Right forearm: Normal.       Left forearm: Normal.       Right lower leg: She exhibits no swelling and no edema.       Left lower leg: She exhibits no swelling and no edema.  Pain with passive terminal wrist flexion with the elbow in full extension - suggesting lateral epicondylitis.   Lymphadenopathy:       Head (right side): No submental, no submandibular, no tonsillar, no preauricular, no posterior auricular and no occipital adenopathy present.       Head (left side): No submental, no submandibular, no tonsillar, no preauricular, no posterior auricular and no occipital adenopathy present.    She has no cervical adenopathy.    Neurological: She is alert and oriented to person, place, and time. She has normal reflexes.  Diabetic foot exam revealed intact sensory with no evidence of any ulcers or lesion. Patient has a birth mark on the arch of her right foot.   Skin: Skin is warm and dry.  Psychiatric: She has a normal mood and affect. Her behavior is normal.  Vitals reviewed.      Assessment & Plan:  1. Prediabetes - Comprehensive metabolic panel - Hemoglobin A1c - Lipid panel  2. Essential hypertension - Comprehensive metabolic panel  Return in about 6 months (around 07/31/2015). Will call patient back with results. After that time we will discuss if a PNA vaccine and/or statin therapy is needed.

## 2015-02-01 LAB — HEMOGLOBIN A1C
Hgb A1c MFr Bld: 5.8 % — ABNORMAL HIGH (ref <5.7)
MEAN PLASMA GLUCOSE: 120 mg/dL — AB (ref <117)

## 2015-02-03 ENCOUNTER — Encounter: Payer: Self-pay | Admitting: Physician Assistant

## 2015-02-14 ENCOUNTER — Other Ambulatory Visit: Payer: Self-pay

## 2015-02-14 DIAGNOSIS — I10 Essential (primary) hypertension: Secondary | ICD-10-CM

## 2015-02-14 MED ORDER — LISINOPRIL-HYDROCHLOROTHIAZIDE 10-12.5 MG PO TABS: 1.0000 | ORAL_TABLET | Freq: Every day | ORAL | Status: DC

## 2015-08-01 ENCOUNTER — Ambulatory Visit: Payer: BC Managed Care – PPO | Admitting: Physician Assistant

## 2015-10-23 ENCOUNTER — Other Ambulatory Visit: Payer: Self-pay | Admitting: Physician Assistant

## 2015-10-23 DIAGNOSIS — Z1231 Encounter for screening mammogram for malignant neoplasm of breast: Secondary | ICD-10-CM

## 2015-10-30 ENCOUNTER — Ambulatory Visit
Admission: RE | Admit: 2015-10-30 | Discharge: 2015-10-30 | Disposition: A | Discharge status: Home / Self Care | Payer: BC Managed Care – PPO | Source: Ambulatory Visit | Attending: Physician Assistant | Admitting: Physician Assistant

## 2015-10-30 DIAGNOSIS — Z1231 Encounter for screening mammogram for malignant neoplasm of breast: Secondary | ICD-10-CM

## 2015-10-31 ENCOUNTER — Other Ambulatory Visit: Payer: Self-pay | Admitting: Physician Assistant

## 2015-10-31 DIAGNOSIS — I10 Essential (primary) hypertension: Secondary | ICD-10-CM

## 2015-11-07 ENCOUNTER — Encounter: Payer: Self-pay | Admitting: Physician Assistant

## 2015-11-07 DIAGNOSIS — I10 Essential (primary) hypertension: Secondary | ICD-10-CM

## 2015-11-08 MED ORDER — LISINOPRIL-HYDROCHLOROTHIAZIDE 10-12.5 MG PO TABS: 1.0000 | ORAL_TABLET | Freq: Every day | ORAL | 0 refills | Status: DC

## 2015-11-30 ENCOUNTER — Other Ambulatory Visit: Payer: Self-pay | Admitting: Physician Assistant

## 2015-11-30 DIAGNOSIS — I10 Essential (primary) hypertension: Secondary | ICD-10-CM

## 2015-12-01 NOTE — Telephone Encounter (Signed)
Spoke with pt - she thought visit was optional... Just wanted to see her.  Explained she needed lab work every6 months for BP meds.  Made appt for Jan 2 - her insurance will pay.  Refilled enough to get to appt.

## 2016-01-06 ENCOUNTER — Encounter: Payer: Self-pay | Admitting: Physician Assistant

## 2016-01-06 DIAGNOSIS — G4733 Obstructive sleep apnea (adult) (pediatric): Secondary | ICD-10-CM | POA: Insufficient documentation

## 2016-01-06 DIAGNOSIS — Z9989 Dependence on other enabling machines and devices: Secondary | ICD-10-CM

## 2016-01-06 HISTORY — DX: Dependence on other enabling machines and devices: Z99.89

## 2016-01-06 HISTORY — DX: Obstructive sleep apnea (adult) (pediatric): G47.33

## 2016-01-16 ENCOUNTER — Encounter: Payer: Self-pay | Admitting: Physician Assistant

## 2016-01-16 ENCOUNTER — Ambulatory Visit (INDEPENDENT_AMBULATORY_CARE_PROVIDER_SITE_OTHER): Payer: BC Managed Care – PPO | Admitting: Physician Assistant

## 2016-01-16 VITALS — BP 130/100 | HR 89 | Temp 98.8°F | Resp 16 | Ht 64.75 in | Wt 272.6 lb

## 2016-01-16 DIAGNOSIS — Z0000 Encounter for general adult medical examination without abnormal findings: Secondary | ICD-10-CM

## 2016-01-16 DIAGNOSIS — I10 Essential (primary) hypertension: Secondary | ICD-10-CM

## 2016-01-16 DIAGNOSIS — Z6841 Body Mass Index (BMI) 40.0 and over, adult: Secondary | ICD-10-CM | POA: Diagnosis not present

## 2016-01-16 DIAGNOSIS — R7303 Prediabetes: Secondary | ICD-10-CM

## 2016-01-16 DIAGNOSIS — R232 Flushing: Secondary | ICD-10-CM

## 2016-01-16 LAB — POCT URINALYSIS DIP (MANUAL ENTRY)
BILIRUBIN UA: NEGATIVE
Glucose, UA: NEGATIVE
Ketones, POC UA: NEGATIVE
Leukocytes, UA: NEGATIVE
NITRITE UA: NEGATIVE
PH UA: 5
Protein Ur, POC: NEGATIVE
RBC UA: NEGATIVE
SPEC GRAV UA: 1.015
UROBILINOGEN UA: 0.2

## 2016-01-16 MED ORDER — LISINOPRIL-HYDROCHLOROTHIAZIDE 10-12.5 MG PO TABS: 1.0000 | ORAL_TABLET | Freq: Every day | ORAL | 3 refills | Status: DC

## 2016-01-16 NOTE — Progress Notes (Signed)
Subjective:    Patient ID: Catherine Trevino, female    DOB: 05-18-1960, 56 y.o.   MRN: [id number]  HPI Presents for Wellness Visit.  Cervical Cancer Screening: no longer a candidate, s/p hysterectomy due to large uterine fibroids causing bleeding and anemia. No history of abnormal pap. Breast Cancer Screening: mammogram 10/30/2015 Colorectal Cancer Screening: colonoscopy 12/22/2014 Bone Density Testing: never HIV Screening: 10/25/2014 STI Screening: very low risk Seasonal Influenza Vaccination: complete Td/Tdap Vaccination: 10/25/2014 Pneumococcal Vaccination: not yet a candidate Zoster Vaccination: not yet a candidate Frequency of Dental evaluation: Q6 months Frequency of Eye evaluation: annually    Review of Systems  Constitutional: Positive for diaphoresis (vasomotor flushing) and fatigue. Negative for activity change, appetite change, chills, fever and unexpected weight change.  HENT: Negative.  Negative for congestion, dental problem, drooling, ear discharge, ear pain, facial swelling, hearing loss, mouth sores, nosebleeds, postnasal drip, rhinorrhea, sinus pain, sinus pressure, sneezing, sore throat, tinnitus, trouble swallowing and voice change.   Eyes: Negative.  Negative for photophobia, pain, discharge, itching and visual disturbance.  Respiratory: Positive for shortness of breath. Negative for apnea, cough, choking, chest tightness, wheezing and stridor.   Cardiovascular: Negative.  Negative for chest pain, palpitations and leg swelling.  Gastrointestinal: Positive for abdominal distention, constipation and diarrhea. Negative for abdominal pain, anal bleeding, blood in stool, nausea, rectal pain and vomiting.  Endocrine: Negative.  Negative for cold intolerance, heat intolerance, polydipsia, polyphagia and polyuria.  Genitourinary: Negative for decreased urine volume, difficulty urinating, dyspareunia, dysuria, enuresis, flank pain, frequency, genital sores, hematuria,  menstrual problem, pelvic pain, urgency, vaginal bleeding, vaginal discharge and vaginal pain.  Musculoskeletal: Negative.  Negative for arthralgias, back pain, gait problem, joint swelling, myalgias, neck pain and neck stiffness.  Skin: Negative.  Negative for color change, pallor, rash and wound.  Allergic/Immunologic: Positive for environmental allergies. Negative for food allergies and immunocompromised state.  Neurological: Negative.  Negative for dizziness, tremors, seizures, syncope, facial asymmetry, speech difficulty, weakness, light-headedness, numbness and headaches.  Hematological: Negative.  Negative for adenopathy. Does not bruise/bleed easily.  Psychiatric/Behavioral: Negative.  Negative for agitation, behavioral problems, confusion, decreased concentration, dysphoric mood, hallucinations, self-injury, sleep disturbance and suicidal ideas. The patient is not nervous/anxious and is not hyperactive.    Patient Active Problem List   Diagnosis Date Noted  . OSA on CPAP 01/06/2016  . Rosacea 01/31/2015  . Lateral epicondylitis of left elbow 01/31/2015  . Hx of adenomatous colonic polyps 12/29/2014  . Prediabetes 10/29/2014  . BMI 40.0-44.9, adult ([place]) 10/25/2014  . HTN (hypertension) 07/03/2012  . GERD (gastroesophageal reflux disease) 07/03/2012  . 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 Anemia   . Diabetes mellitus without complication ([place])    pt stated prediabetes, not on meds  . GERD (gastroesophageal reflux disease)   . Hx of adenomatous colonic polyps 12/29/2014  . Hypertension   . OSA on CPAP 01/06/2016    Family History  Problem Relation Age of Onset  . Hypertension Father   . Emphysema Maternal Grandmother   . Cancer Maternal Grandfather   . Hypertension Paternal Grandmother   . Cancer Paternal Grandmother   . Diabetes Paternal Grandmother   . Hyperlipidemia Paternal Grandmother   . Hypertension  Paternal Grandfather   . Diabetes Paternal Grandfather   . Hyperlipidemia Paternal Grandfather   . Colon cancer Neg Hx     Social History   Social History  . Marital status: Married  Spouse name: Deidre Ala  . Number of children: 3  . Years of education: Master's Degree   Occupational History  . [employer] for MS   Social History Main Topics  . Smoking status: Never Smoker  . Smokeless tobacco: Never Used  . Alcohol use No  . Drug use: No  . Sexual activity: No     Comment: husband is impotent, no other partners   Other Topics Concern  . Not on file   Social History Narrative   Lives with her husband.    No Known Allergies  Prior to Admission medications   Medication Sig Start Date End Date Taking? Authorizing Provider  Biotin 10000 MCG TABS Take by mouth.   Yes Historical Provider, MD  Cholecalciferol (VITAMIN D-3 PO) Take by mouth.   Yes Historical Provider, MD  lisinopril-hydrochlorothiazide (PRINZIDE,ZESTORETIC) 10-12.5 MG tablet TAKE 1 TABLET DAILY 12/01/15  Yes Layken Doenges, PA-C  metroNIDAZOLE (METROCREAM) 0.75 % cream APP ON THE SKIN BID 10/13/14  Yes Historical Provider, MD  OVER THE COUNTER MEDICATION Tumeric/Curcumin 500 mg taking   Yes Historical Provider, MD  Milk Thistle 250 MG CAPS Take 250 mg by mouth daily. Reported on 01/31/2015    Historical Provider, MD       Objective:   Physical Exam  Constitutional: She is oriented to person, place, and time. Vital signs are normal. She appears well-developed and well-nourished. She is active and cooperative. No distress.  BP (!) 130/100 (BP Location: Left Arm, Cuff Size: Large)   Pulse 89   Temp 98.8 F (37.1 C) (Oral)   Resp 16   Ht 5' 4.75" (1.645 m)   Wt 272 lb 9.6 oz (123.7 kg)   SpO2 95%   BMI 45.71 kg/m    HENT:  Head: Normocephalic and atraumatic.  Right Ear: Hearing, tympanic membrane, external ear and ear canal normal. No foreign bodies.  Left Ear:  Hearing, tympanic membrane, external ear and ear canal normal. No foreign bodies.  Nose: Nose normal.  Mouth/Throat: Uvula is midline, oropharynx is clear and moist and mucous membranes are normal. No oral lesions. Normal dentition. No dental abscesses or uvula swelling. No oropharyngeal exudate.  Eyes: Conjunctivae, EOM and lids are normal. Pupils are equal, round, and reactive to light. Right eye exhibits no discharge. Left eye exhibits no discharge. No scleral icterus.  Fundoscopic exam:      The right eye shows no arteriolar narrowing, no AV nicking, no exudate, no hemorrhage and no papilledema. The right eye shows red reflex.       The left eye shows no arteriolar narrowing, no AV nicking, no exudate, no hemorrhage and no papilledema. The left eye shows red reflex.  Neck: Trachea normal, normal range of motion and full passive range of motion without pain. Neck supple. No spinous process tenderness and no muscular tenderness present. No thyroid mass and no thyromegaly present.  Cardiovascular: Normal rate, regular rhythm, normal heart sounds, intact distal pulses and normal pulses.   Pulmonary/Chest: Effort normal and breath sounds normal. Right breast exhibits no inverted nipple, no mass, no nipple discharge, no skin change and no tenderness. Left breast exhibits no inverted nipple, no mass, no nipple discharge, no skin change and no tenderness. Breasts are symmetrical.  Abdominal: Soft. Bowel sounds are normal. She exhibits no distension and no mass. There is no tenderness.  Musculoskeletal: She exhibits no edema or tenderness.       Cervical back: Normal.  Thoracic back: Normal.       Lumbar back: Normal.  Lymphadenopathy:       Head (right side): No tonsillar, no preauricular, no posterior auricular and no occipital adenopathy present.       Head (left side): No tonsillar, no preauricular, no posterior auricular and no occipital adenopathy present.    She has no cervical adenopathy.         Right: No supraclavicular adenopathy present.       Left: No supraclavicular adenopathy present.  Neurological: She is alert and oriented to person, place, and time. She has normal strength and normal reflexes. No cranial nerve deficit. She exhibits normal muscle tone. Coordination and gait normal.  Skin: Skin is warm, dry and intact. No rash noted. She is not diaphoretic. No cyanosis or erythema. Nails show no clubbing.  Psychiatric: She has a normal mood and affect. Her speech is normal and behavior is normal. Judgment and thought content normal.   Wt Readings from Last 3 Encounters:  01/16/16 272 lb 9.6 oz (123.7 kg)  01/31/15 255 lb 12.8 oz (116 kg)  12/22/14 269 lb (122 kg)          Assessment & Plan:  1. Annual physical exam Age appropriate anticipatory guidance provided.  2. BMI 45.0-49.9, adult ([facility]) Healthy eating, increase exercise. Consider medical weight loss management.  3. Prediabetes Await labs. Adjust regimen as indicated by results. May need metformin. Lifestyle modifications - Comprehensive metabolic panel - Hemoglobin A1c - Lipid panel  4. Essential hypertension Controlled. Continue current treatment - CBC with Differential/Platelet - Comprehensive metabolic panel - TSH - POCT urinalysis dipstick - Urine Microscopic - lisinopril-hydrochlorothiazide (PRINZIDE,ZESTORETIC) 10-12.5 MG tablet; Take 1 tablet by mouth daily.  Dispense: 90 tablet; Refill: 3  5. Vasomotor flushing Discussed options. Trial OTC black cohosh and topical peppermint oil behind the ears.   Fara Chute, PA-C Physician Assistant-Certified [facility]

## 2016-01-16 NOTE — Patient Instructions (Addendum)
Try peppermint oil behind your ears to help with hot flashes.   IF you received an x-ray today, you will receive an invoice from [facility]. Please contact [facility] at [phone] with questions or concerns regarding your invoice.   IF you received labwork today, you will receive an invoice from [place]. Please contact [facility] at [phone] with questions or concerns regarding your invoice.   Our billing staff will not be able to assist you with questions regarding bills from these companies.  You will be contacted with the lab results as soon as they are available. The fastest way to get your results is to activate your My Chart account. Instructions are located on the last page of this paperwork. If you have not heard from [place] regarding the results in 2 weeks, please contact this office.    Keeping You Healthy  Get These Tests  Blood Pressure- Have your blood pressure checked by your healthcare provider at least once a year.  Normal blood pressure is 120/80.  Weight- Have your body mass index (BMI) calculated to screen for obesity.  BMI is a measure of body fat based on height and weight.  You can calculate your own BMI at [employer]  Cholesterol- Have your cholesterol checked every year.  Diabetes- Have your blood sugar checked every year if you have high blood pressure, high cholesterol, a family history of diabetes or if you are overweight.  Pap Test - Have a pap test every 1 to 5 years if you have been sexually active.  If you are older than 65 and recent pap tests have been normal you may not need additional pap tests.  In addition, if you have had a hysterectomy  for benign disease additional pap tests are not necessary.  Mammogram-Yearly mammograms are essential for early detection of breast cancer  Screening for Colon Cancer- Colonoscopy starting at age 71. Screening may begin sooner depending on your family history and other health  conditions.  Follow up colonoscopy as directed by your Gastroenterologist.  Screening for Osteoporosis- Screening begins at age 24 with bone density scanning, sooner if you are at higher risk for developing Osteoporosis.  Get these medicines  Calcium with Vitamin D- Your body requires 1200-1500 mg of Calcium a day and [phone] IU of Vitamin D a day.  You can only absorb 500 mg of Calcium at a time therefore Calcium must be taken in 2 or 3 separate doses throughout the day.  Hormones- Hormone therapy has been associated with increased risk for certain cancers and heart disease.  Talk to your healthcare provider about if you need relief from menopausal symptoms.  Aspirin- Ask your healthcare provider about taking Aspirin to prevent Heart Disease and Stroke.  Get these Immuniztions  Flu shot- Every fall  Pneumonia shot- Once after the age of 76; if you are younger ask your healthcare provider if you need a pneumonia shot.  Tetanus- Every ten years.  Zostavax- Once after the age of 43 to prevent shingles.  Take these steps  Don't smoke- Your healthcare provider can help you quit. For tips on how to quit, ask your healthcare provider or go to www.smokefree.gov or call 1-800 QUIT-NOW.  Be physically active- Exercise 5 days a week for a minimum of 30 minutes.  If you are not already physically active, start slow and gradually work up to 30 minutes of moderate physical activity.  Try walking, dancing, bike riding, swimming, etc.  Eat a healthy diet- Eat a variety of  healthy foods such as fruits, vegetables, whole grains, low fat milk, low fat cheeses, yogurt, lean meats, chicken, fish, eggs, dried beans, tofu, etc.  For more information go to www.thenutritionsource.org  Dental visit- Brush and floss teeth twice daily; visit your dentist twice a year.  Eye exam- Visit your Optometrist or Ophthalmologist yearly.  Drink alcohol in moderation- Limit alcohol intake to one drink or less a day.   Never drink and drive.  Depression- Your emotional health is as important as your physical health.  If you're feeling down or losing interest in things you normally enjoy, please talk to your healthcare provider.  Seat Belts- can save your life; always wear one  Smoke/Carbon Monoxide detectors- These detectors need to be installed on the appropriate level of your home.  Replace batteries at least once a year.  Violence- If anyone is threatening or hurting you, please tell your healthcare provider.  Living Will/ Health care power of attorney- Discuss with your healthcare provider and family.

## 2016-01-17 LAB — LIPID PANEL
CHOL/HDL RATIO: 3.4 ratio (ref 0.0–4.4)
Cholesterol, Total: 149 mg/dL (ref 100–199)
HDL: 44 mg/dL (ref >39)
LDL Calculated: 63 mg/dL (ref 0–99)
TRIGLYCERIDES: 212 mg/dL — AB (ref 0–149)
VLDL Cholesterol Cal: 42 mg/dL — ABNORMAL HIGH (ref 5–40)

## 2016-01-17 LAB — HEMOGLOBIN A1C
ESTIMATED AVERAGE GLUCOSE: 123 mg/dL
HEMOGLOBIN A1C: 5.9 % — AB (ref 4.8–5.6)

## 2016-01-17 LAB — CBC WITH DIFFERENTIAL/PLATELET
BASOS ABS: 0 10*3/uL (ref 0.0–0.2)
Basos: 0 %
EOS (ABSOLUTE): 0.1 10*3/uL (ref 0.0–0.4)
Eos: 2 %
Hematocrit: 42.6 % (ref 34.0–46.6)
Hemoglobin: 14.3 g/dL (ref 11.1–15.9)
Immature Grans (Abs): 0 10*3/uL (ref 0.0–0.1)
Immature Granulocytes: 0 %
LYMPHS ABS: 2.1 10*3/uL (ref 0.7–3.1)
Lymphs: 29 %
MCH: 28.2 pg (ref 26.6–33.0)
MCHC: 33.6 g/dL (ref 31.5–35.7)
MCV: 84 fL (ref 79–97)
MONOS ABS: 0.5 10*3/uL (ref 0.1–0.9)
Monocytes: 8 %
NEUTROS ABS: 4.4 10*3/uL (ref 1.4–7.0)
Neutrophils: 61 %
Platelets: 211 10*3/uL (ref 150–379)
RBC: 5.07 x10E6/uL (ref 3.77–5.28)
RDW: 14.3 % (ref 12.3–15.4)
WBC: 7.2 10*3/uL (ref 3.4–10.8)

## 2016-01-17 LAB — COMPREHENSIVE METABOLIC PANEL
ALK PHOS: 107 IU/L (ref 39–117)
ALT: 76 IU/L — ABNORMAL HIGH (ref 0–32)
AST: 40 IU/L (ref 0–40)
Albumin/Globulin Ratio: 1.3 (ref 1.2–2.2)
Albumin: 4.2 g/dL (ref 3.5–5.5)
BILIRUBIN TOTAL: 0.5 mg/dL (ref 0.0–1.2)
BUN/Creatinine Ratio: 17 (ref 9–23)
BUN: 16 mg/dL (ref 6–24)
CHLORIDE: 98 mmol/L (ref 96–106)
CO2: 25 mmol/L (ref 18–29)
CREATININE: 0.94 mg/dL (ref 0.57–1.00)
Calcium: 9.4 mg/dL (ref 8.7–10.2)
GFR calc Af Amer: 79 mL/min/{1.73_m2} (ref >59)
GFR calc non Af Amer: 69 mL/min/{1.73_m2} (ref >59)
GLUCOSE: 113 mg/dL — AB (ref 65–99)
Globulin, Total: 3.2 g/dL (ref 1.5–4.5)
Potassium: 4.3 mmol/L (ref 3.5–5.2)
SODIUM: 140 mmol/L (ref 134–144)
Total Protein: 7.4 g/dL (ref 6.0–8.5)

## 2016-01-17 LAB — URINALYSIS, MICROSCOPIC ONLY: CASTS: NONE SEEN /LPF

## 2016-01-17 LAB — TSH: TSH: 3.31 u[IU]/mL (ref 0.450–4.500)

## 2016-02-20 DIAGNOSIS — R748 Abnormal levels of other serum enzymes: Secondary | ICD-10-CM | POA: Insufficient documentation

## 2016-02-20 DIAGNOSIS — E8881 Metabolic syndrome: Secondary | ICD-10-CM | POA: Insufficient documentation

## 2016-02-20 DIAGNOSIS — E781 Pure hyperglyceridemia: Secondary | ICD-10-CM | POA: Insufficient documentation

## 2016-02-20 DIAGNOSIS — R635 Abnormal weight gain: Secondary | ICD-10-CM | POA: Insufficient documentation

## 2016-03-02 DIAGNOSIS — K5792 Diverticulitis of intestine, part unspecified, without perforation or abscess without bleeding: Secondary | ICD-10-CM | POA: Insufficient documentation

## 2016-03-10 ENCOUNTER — Emergency Department (HOSPITAL_COMMUNITY)
Admission: EM | Admit: 2016-03-10 | Discharge: 2016-03-10 | Disposition: A | Discharge status: Home / Self Care | Payer: BC Managed Care – PPO | Attending: Emergency Medicine | Admitting: Emergency Medicine

## 2016-03-10 ENCOUNTER — Encounter (HOSPITAL_COMMUNITY): Payer: Self-pay | Admitting: Emergency Medicine

## 2016-03-10 ENCOUNTER — Emergency Department (HOSPITAL_COMMUNITY): Payer: BC Managed Care – PPO

## 2016-03-10 DIAGNOSIS — I10 Essential (primary) hypertension: Secondary | ICD-10-CM | POA: Diagnosis not present

## 2016-03-10 DIAGNOSIS — E119 Type 2 diabetes mellitus without complications: Secondary | ICD-10-CM | POA: Diagnosis not present

## 2016-03-10 DIAGNOSIS — K5732 Diverticulitis of large intestine without perforation or abscess without bleeding: Secondary | ICD-10-CM | POA: Diagnosis not present

## 2016-03-10 DIAGNOSIS — R109 Unspecified abdominal pain: Secondary | ICD-10-CM | POA: Diagnosis present

## 2016-03-10 LAB — URINALYSIS, ROUTINE W REFLEX MICROSCOPIC
Bilirubin Urine: NEGATIVE
Glucose, UA: NEGATIVE mg/dL
HGB URINE DIPSTICK: NEGATIVE
Ketones, ur: NEGATIVE mg/dL
LEUKOCYTES UA: NEGATIVE
Nitrite: NEGATIVE
PROTEIN: NEGATIVE mg/dL
Specific Gravity, Urine: 1.006 (ref 1.005–1.030)
pH: 8 (ref 5.0–8.0)

## 2016-03-10 LAB — CBC WITH DIFFERENTIAL/PLATELET
BASOS ABS: 0 10*3/uL (ref 0.0–0.1)
BASOS PCT: 0 %
EOS ABS: 0 10*3/uL (ref 0.0–0.7)
Eosinophils Relative: 0 %
HCT: 40.5 % (ref 36.0–46.0)
HEMOGLOBIN: 14 g/dL (ref 12.0–15.0)
Lymphocytes Relative: 14 %
Lymphs Abs: 1.8 10*3/uL (ref 0.7–4.0)
MCH: 29.2 pg (ref 26.0–34.0)
MCHC: 34.6 g/dL (ref 30.0–36.0)
MCV: 84.4 fL (ref 78.0–100.0)
Monocytes Absolute: 1 10*3/uL (ref 0.1–1.0)
Monocytes Relative: 8 %
NEUTROS PCT: 78 %
Neutro Abs: 9.8 10*3/uL — ABNORMAL HIGH (ref 1.7–7.7)
Platelets: 176 10*3/uL (ref 150–400)
RBC: 4.8 MIL/uL (ref 3.87–5.11)
RDW: 13.4 % (ref 11.5–15.5)
WBC: 12.7 10*3/uL — ABNORMAL HIGH (ref 4.0–10.5)

## 2016-03-10 LAB — I-STAT CHEM 8, ED
BUN: 16 mg/dL (ref 6–20)
CALCIUM ION: 1.18 mmol/L (ref 1.15–1.40)
CHLORIDE: 101 mmol/L (ref 101–111)
Creatinine, Ser: 1 mg/dL (ref 0.44–1.00)
Glucose, Bld: 138 mg/dL — ABNORMAL HIGH (ref 65–99)
HCT: 40 % (ref 36.0–46.0)
Hemoglobin: 13.6 g/dL (ref 12.0–15.0)
POTASSIUM: 4.4 mmol/L (ref 3.5–5.1)
SODIUM: 140 mmol/L (ref 135–145)
TCO2: 29 mmol/L (ref 0–100)

## 2016-03-10 LAB — HEPATIC FUNCTION PANEL
ALBUMIN: 3.7 g/dL (ref 3.5–5.0)
ALK PHOS: 77 U/L (ref 38–126)
ALT: 44 U/L (ref 14–54)
AST: 29 U/L (ref 15–41)
Bilirubin, Direct: 0.2 mg/dL (ref 0.1–0.5)
Indirect Bilirubin: 0.7 mg/dL (ref 0.3–0.9)
TOTAL PROTEIN: 7 g/dL (ref 6.5–8.1)
Total Bilirubin: 0.9 mg/dL (ref 0.3–1.2)

## 2016-03-10 LAB — LIPASE, BLOOD: LIPASE: 18 U/L (ref 11–51)

## 2016-03-10 MED ORDER — SODIUM CHLORIDE 0.9 % IV BOLUS (SEPSIS)
1000.0000 mL | Freq: Once | INTRAVENOUS | Status: AC
  Administered 2016-03-10: 1000 mL via INTRAVENOUS

## 2016-03-10 MED ORDER — METRONIDAZOLE 500 MG PO TABS
500.0000 mg | ORAL_TABLET | Freq: Three times a day (TID) | ORAL | Status: DC
  Administered 2016-03-10: 500 mg via ORAL
  Filled 2016-03-10: qty 1

## 2016-03-10 MED ORDER — CIPROFLOXACIN HCL 500 MG PO TABS: 500.0000 mg | ORAL_TABLET | Freq: Two times a day (BID) | ORAL | 0 refills | Status: DC

## 2016-03-10 MED ORDER — IOPAMIDOL (ISOVUE-300) INJECTION 61%
INTRAVENOUS | Status: AC
  Administered 2016-03-10: 100 mL
  Filled 2016-03-10: qty 100

## 2016-03-10 MED ORDER — ONDANSETRON HCL 4 MG/2ML IJ SOLN
4.0000 mg | Freq: Once | INTRAMUSCULAR | Status: AC
Start: 2016-03-10 — End: 2016-03-10
  Administered 2016-03-10: 4 mg via INTRAVENOUS
  Filled 2016-03-10: qty 2

## 2016-03-10 MED ORDER — METRONIDAZOLE 500 MG PO TABS: 500.0000 mg | ORAL_TABLET | Freq: Two times a day (BID) | ORAL | 0 refills | Status: DC

## 2016-03-10 MED ORDER — CIPROFLOXACIN IN D5W 400 MG/200ML IV SOLN
400.0000 mg | Freq: Once | INTRAVENOUS | Status: AC
Start: 2016-03-10 — End: 2016-03-10
  Administered 2016-03-10: 400 mg via INTRAVENOUS
  Filled 2016-03-10: qty 200

## 2016-03-10 MED ORDER — MORPHINE SULFATE (PF) 4 MG/ML IV SOLN
4.0000 mg | Freq: Once | INTRAVENOUS | Status: AC
  Administered 2016-03-10: 4 mg via INTRAVENOUS
  Filled 2016-03-10: qty 1

## 2016-03-10 NOTE — ED Notes (Signed)
Pt stable, ambulatory, states understanding of discharge instructions 

## 2016-03-10 NOTE — ED Notes (Signed)
Patient transported to CT 

## 2016-03-10 NOTE — Discharge Instructions (Signed)
Please take antibiotics as prescribed for the full duration as treatment of diverticulitis.  Avoid alcohol use while taking antibiotics.  Followup with your doctor for further care.  Eat yogurt high in probiotic while taking antibiotic to decrease risk of diarrhea complication.  Return to the ER if you have any concerns.

## 2016-03-10 NOTE — ED Provider Notes (Signed)
[facility] DEPT Provider Note   CSN: [id number] Arrival date & time: 03/10/16  [id number]     History   Chief Complaint Chief Complaint  Patient presents with  . Abdominal Pain    HPI Catherine Trevino is a 56 y.o. female.  HPI   56 year old obese female with history of diabetes, GERD, hypertension presenting for valuation of abdominal pain. Patient reports developing gradual onset of low abdominal pain which started yesterday afternoon. She described pain as a crampy sensation to her right abdomen that spread throughout abdomen, persistent, 8 out of 10, with decrease in appetite, feeling nauseous, and did report subjective fever and chills. Symptom has been persistent, nothing seems to make it better or worse. Denies having similar symptoms like this in the past. No report of URI symptoms, chest pain, short of breath, productive cough, back pain, dysuria, hematuria, vaginal bleeding or vaginal discharge. She did try to have a bowel movement yesterday with minimal improvement. Prior history of partial hysterectomy and tubal ligation.  Past Medical History:  Diagnosis Date  . Anemia   . Diabetes mellitus without complication ([place])    pt stated prediabetes, not on meds  . GERD (gastroesophageal reflux disease)   . Hx of adenomatous colonic polyps 12/29/2014  . Hypertension   . OSA on CPAP 01/06/2016    Patient Active Problem List   Diagnosis Date Noted  . OSA on CPAP 01/06/2016  . Rosacea 01/31/2015  . Lateral epicondylitis of left elbow 01/31/2015  . Hx of adenomatous colonic polyps 12/29/2014  . Prediabetes 10/29/2014  . BMI 45.0-49.9, adult ([place]) 10/25/2014  . HTN (hypertension) 07/03/2012  . GERD (gastroesophageal reflux disease) 07/03/2012  . 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 ABDOMINAL HYSTERECTOMY    . COLONOSCOPY    . TUBAL LIGATION      OB History    No data available        Home Medications    Prior to Admission medications   Medication Sig Start Date End Date Taking? Authorizing Provider  Biotin 10000 MCG TABS Take by mouth.    Historical Provider, MD  Cholecalciferol (VITAMIN D-3 PO) Take by mouth.    Historical Provider, MD  lisinopril-hydrochlorothiazide (PRINZIDE,ZESTORETIC) 10-12.5 MG tablet Take 1 tablet by mouth daily. 01/16/16   Chelle Jeffery, PA-C  metroNIDAZOLE (METROCREAM) 0.75 % cream APP ON THE SKIN BID 10/13/14   Historical Provider, MD  Milk Thistle 250 MG CAPS Take 250 mg by mouth daily. Reported on 01/31/2015    Historical Provider, MD  OVER THE COUNTER MEDICATION Tumeric/Curcumin 500 mg taking    Historical Provider, MD    Family History Family History  Problem Relation Age of Onset  . Hypertension Father   . Emphysema Maternal Grandmother   . Cancer Maternal Grandfather   . Hypertension Paternal Grandmother   . Cancer Paternal Grandmother   . Diabetes Paternal Grandmother   . Hyperlipidemia Paternal Grandmother   . Hypertension Paternal Grandfather   . Diabetes Paternal Grandfather   . Hyperlipidemia Paternal Grandfather   . Colon cancer Neg Hx     Social History Social History  Substance Use Topics  . Smoking status: Never Smoker  . Smokeless tobacco: Never Used  . Alcohol use No     Allergies   Patient has no known allergies.   Review of Systems Review of Systems  All other systems reviewed and are negative.    Physical Exam Updated Vital Signs  BP 160/96 (BP Location: Left Arm)   Pulse 109   Temp 98.8 F (37.1 C) (Oral)   Resp 18   SpO2 93%   Physical Exam  Constitutional: She appears well-developed and well-nourished. No distress.  Obese female, nontoxic in appearance  HENT:  Head: Atraumatic.  Mouth/Throat: Oropharynx is clear and moist.  Eyes: Conjunctivae are normal.  Neck: Neck supple.  Cardiovascular:  Tachycardia without murmurs rubs or gallops  Pulmonary/Chest: Effort normal and breath  sounds normal.  Abdominal: Soft. She exhibits no distension. There is tenderness (Tenderness to suprapubic and right lower quadrant with guarding and rebound tenderness. No pain at McBurney's point.  negative Murphy sign. ).  Neurological: She is alert.  Skin: No rash noted.  Psychiatric: She has a normal mood and affect.  Nursing note and vitals reviewed.    ED Treatments / Results  Labs (all labs ordered are listed, but only abnormal results are displayed) Labs Reviewed  CBC WITH DIFFERENTIAL/PLATELET - Abnormal; Notable for the following:       Result Value   WBC 12.7 (*)    Neutro Abs 9.8 (*)    All other components within normal limits  URINALYSIS, ROUTINE W REFLEX MICROSCOPIC - Abnormal; Notable for the following:    APPearance HAZY (*)    All other components within normal limits  I-STAT CHEM 8, ED - Abnormal; Notable for the following:    Glucose, Bld 138 (*)    All other components within normal limits  LIPASE, BLOOD  HEPATIC FUNCTION PANEL    EKG  EKG Interpretation None       Radiology Ct Abdomen Pelvis W Contrast  Result Date: 03/10/2016 CLINICAL DATA:  Right-sided abdominal and pelvic pain, nausea and low-grade fever for 1 day. EXAM: CT ABDOMEN AND PELVIS WITH CONTRAST TECHNIQUE: Multidetector CT imaging of the abdomen and pelvis was performed using the standard protocol following bolus administration of intravenous contrast. CONTRAST:  121mL ISOVUE-300 IOPAMIDOL (ISOVUE-300) INJECTION 61% COMPARISON:  None. FINDINGS: Lower chest: No acute abnormality. Upper limits normal heart size noted. Hepatobiliary: Hepatic steatosis noted. No focal hepatic abnormalities are identified. The gallbladder is unremarkable. There is no evidence of biliary dilatation. Pancreas: Unremarkable Spleen: Unremarkable Adrenals/Urinary Tract: The kidneys, adrenal glands and bladder are unremarkable. Stomach/Bowel: Focal wall thickening of the proximal sigmoid colon is noted with moderate  adjacent inflammation, likely representing acute diverticulitis. There is no evidence of bowel obstruction, pneumoperitoneum or abscess. No other focal bowel wall thickening is identified. The appendix is normal. Vascular/Lymphatic: No significant vascular findings are present. No enlarged abdominal or pelvic lymph nodes. Reproductive: Status post hysterectomy. No adnexal masses. Other: No abdominal wall hernia or abnormality. No abdominopelvic ascites. Musculoskeletal: No acute or significant osseous findings. IMPRESSION: Wall thickening of the proximal sigmoid colon with moderate adjacent inflammation - likely representing acute diverticulitis. No evidence of abscess or pneumoperitoneum. Hepatic steatosis. Electronically Signed   By: Margarette Canada M.D.   On: 03/10/2016 11:58    Procedures Procedures (including critical care time)  Medications Ordered in ED Medications  metroNIDAZOLE (FLAGYL) tablet 500 mg (500 mg Oral Given 03/10/16 1253)  sodium chloride 0.9 % bolus 1,000 mL (0 mLs Intravenous Stopped 03/10/16 1258)  morphine 4 MG/ML injection 4 mg (4 mg Intravenous Given 03/10/16 1002)  ondansetron (ZOFRAN) injection 4 mg (4 mg Intravenous Given 03/10/16 1002)  iopamidol (ISOVUE-300) 61 % injection (100 mLs  Contrast Given 03/10/16 1108)  ciprofloxacin (CIPRO) IVPB 400 mg (0 mg Intravenous Stopped 03/10/16 1353)  Initial Impression / Assessment and Plan / ED Course  I have reviewed the triage vital signs and the nursing notes.  Pertinent labs & imaging results that were available during my care of the patient were reviewed by me and considered in my medical decision making (see chart for details).     BP 134/79 (BP Location: Right Arm)   Pulse 103   Temp 98.8 F (37.1 C) (Oral)   Resp 16   SpO2 95%    Final Clinical Impressions(s) / ED Diagnoses   Final diagnoses:  Sigmoid diverticulitis    New Prescriptions New Prescriptions   No medications on file   9:09 AM Patient here  with low abdominal pain, fever, and nausea. Her low abdomen is markedly tender to palpation with guarding rebound. She is tachycardic but afebrile. Workup initiated will obtain abdominal and pelvis CT scan, pain medication and antinausea medication along with IV fluid given.  12:24 PM Mildly elevated WBC of 12.7 otherwise labs are reassuring. UA without signs of urine tract infection. Abdominal and pelvis CT scan with signs of acute diverticulitis involving the proximal sigmoid colon without evidence of abscess or pneumoperitoneum. Normal appendix. This finding is consistence with patient's presenting complaint. Patient will receive antibiotics including Cipro and Flagyl as treatment.  3:03 PM Pt stable for discharge with abx and outpt f/u. Return precaution given.  Care discussed with Dr. Sherry Ruffing.    Domenic Moras, PA-C 03/10/16 [place], MD 03/10/16 2230

## 2016-03-10 NOTE — ED Triage Notes (Signed)
Pt sts abd pain worse on the right side x 3 days with nausea

## 2016-10-14 ENCOUNTER — Telehealth: Payer: Self-pay | Admitting: Physician Assistant

## 2016-10-14 ENCOUNTER — Telehealth: Payer: Self-pay

## 2016-10-14 NOTE — Telephone Encounter (Signed)
I wanted to verify with you that women at the age of 80 are suppose to have a mammogram every year and then at the age of 52 they can go every two years if no history of breast cancer. Please advise.

## 2016-10-14 NOTE — Telephone Encounter (Signed)
It depends on the group from which you seek recommendations.  Numerous groups in the [place] continue to recommend annual screening mammograms beginning at age 55, and in the [place], our insurance companies continue to cover that as health maintenance. Most radiologists still recommend annually beginning at age 82.  However, the USPSTF ([place] Preventive Services Task Force) and others, recommend screening beginning at age 43 and every OTHER year. This recommendation is based on the results of several HUGE studies (90,000 women) over 25 years, that show there is not improved outcomes by starting at 40 vs 50 and annually vs every other year.  My Chart health maintenance recommendations default to the best evidence for screening, which aligns with the USPSTF recommendations. Some may have updated modifiers based on their medical history, increased risk of breast cancer, or individual provider preference to use other recommendations.  So, the patient can continue to have annual mammograms, or change to every other year. It's up to her.

## 2016-10-14 NOTE — Telephone Encounter (Signed)
PATIENT WOULD LIKE CHELLE TO KNOW THAT SHE GOT A MY-CHART MESSAGE SAYING IT WAS TIME TO SCHEDULE HER ANNUAL MAMMOGRAM. BUT WHEN SHE WENT INTO THE HEALTH RECORD PART OF HER CHART IT SAID SHE WAS NOT DUE UNTIL 11/11/17. HER LAST MAMMOGRAM WAS DONE LAST YEAR OCT. OF 2017. WHEN DOES SHE NEED TO HAVE IT REPEATED? IS IT EVERY YEAR OR EVERY 2 YEARS FOR HER? SHE JUST TURNED 47. BEST PHONE [phone] (CELL) [place]

## 2016-10-14 NOTE — Telephone Encounter (Signed)
Pt.notified

## 2017-01-01 NOTE — Telephone Encounter (Signed)
Error-Sign Encounter 

## 2017-01-29 ENCOUNTER — Other Ambulatory Visit: Payer: Self-pay | Admitting: Physician Assistant

## 2017-01-29 DIAGNOSIS — I10 Essential (primary) hypertension: Secondary | ICD-10-CM

## 2017-02-07 ENCOUNTER — Ambulatory Visit (INDEPENDENT_AMBULATORY_CARE_PROVIDER_SITE_OTHER): Payer: BC Managed Care – PPO | Admitting: Physician Assistant

## 2017-02-07 ENCOUNTER — Other Ambulatory Visit: Payer: Self-pay

## 2017-02-07 ENCOUNTER — Encounter: Payer: Self-pay | Admitting: Physician Assistant

## 2017-02-07 VITALS — BP 118/82 | HR 97 | Temp 97.9°F | Resp 18 | Ht 64.75 in | Wt 236.8 lb

## 2017-02-07 DIAGNOSIS — I10 Essential (primary) hypertension: Secondary | ICD-10-CM | POA: Diagnosis not present

## 2017-02-07 DIAGNOSIS — G4733 Obstructive sleep apnea (adult) (pediatric): Secondary | ICD-10-CM | POA: Diagnosis not present

## 2017-02-07 DIAGNOSIS — Z6839 Body mass index (BMI) 39.0-39.9, adult: Secondary | ICD-10-CM

## 2017-02-07 DIAGNOSIS — R7303 Prediabetes: Secondary | ICD-10-CM | POA: Diagnosis not present

## 2017-02-07 DIAGNOSIS — L719 Rosacea, unspecified: Secondary | ICD-10-CM

## 2017-02-07 DIAGNOSIS — Z23 Encounter for immunization: Secondary | ICD-10-CM

## 2017-02-07 DIAGNOSIS — Z0000 Encounter for general adult medical examination without abnormal findings: Secondary | ICD-10-CM

## 2017-02-07 MED ORDER — TOPIRAMATE 100 MG PO TABS: 100.0000 mg | ORAL_TABLET | Freq: Every day | ORAL | 3 refills | Status: DC

## 2017-02-07 MED ORDER — METRONIDAZOLE 0.75 % EX CREA: TOPICAL_CREAM | CUTANEOUS | 2 refills | Status: DC

## 2017-02-07 NOTE — Progress Notes (Signed)
Patient ID: Catherine Trevino, female    DOB: Jul 29, 1960, 57 y.o.   MRN: [id number]  PCP: Harrison Mons, PA-C  Chief Complaint  Patient presents with  . Annual Exam  . Medication Refill    Lisinopril-Hydrochlorothiazide, Qsymia (pt was wondering if provider could fill or does she need to go back dematologist.    Subjective:   Presents for [employer] Visit.  Is recovering from a URI, still some congestion.  Has been worked with medical weight management. Is enjoying the success of her efforts. Is taking Qsymia. Isn't sure that she wants to sign up for another round, just to continue the Qsymia (phentermine + topiramate). Also considering stopping it, because she'd rather not take medicaitons at all. Has structured her meals. Added snacks: string cheese and almonds in the mornings. Protein bars if she isn't able to eat a regular meal. 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 Her job has changed ("kind of a promotion"), so meal planning is necessary. Long hours have reduced ability to exercise. Has a stand-up desk, has exercise bands for strength exercise at her desk. Drinks a lot of water.  Separating from her husband.  MWM prescribed bupropion for about 1 week, then stopped, just didn't want to take additional medications.  Cervical Cancer Screening: no longer a candidate, s/p hysterectomy due to large uterine fibroids causing bleeding and anemia. No history of abnormal pap. Breast Cancer Screening: mammogram 10/30/2015 Colorectal Cancer Screening: colonoscopy 12/22/2014 Bone Density Testing: never HIV Screening: 10/25/2014 STI Screening: very low risk Seasonal Influenza Vaccination: complete Td/Tdap Vaccination: 10/25/2014 Pneumococcal Vaccination: not yet a candidate Zoster Vaccination: not yet a candidate Frequency of Dental evaluation: Q6 months Frequency of Eye evaluation: annually   Patient Active Problem List   Diagnosis Date Noted  . OSA on  CPAP 01/06/2016  . Rosacea 01/31/2015  . Lateral epicondylitis of left elbow 01/31/2015  . Hx of adenomatous colonic polyps 12/29/2014  . Prediabetes 10/29/2014  . BMI 45.0-49.9, adult ([place]) 10/25/2014  . HTN (hypertension) 07/03/2012  . GERD (gastroesophageal reflux disease) 07/03/2012  . 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 Anemia   . Diabetes mellitus without complication ([place])    pt stated prediabetes, not on meds  . Diverticulitis   . GERD (gastroesophageal reflux disease)   . Hx of adenomatous colonic polyps 12/29/2014  . Hypertension   . OSA on CPAP 01/06/2016     Prior to Admission medications   Medication Sig Start Date End Date Taking? Authorizing Provider  lisinopril-hydrochlorothiazide (PRINZIDE,ZESTORETIC) 10-12.5 MG tablet TAKE 1 TABLET DAILY 01/29/17  Yes Chelesa Weingartner, PA-C  metroNIDAZOLE (METROCREAM) 0.75 % cream Apply to affected area once daily 10/13/14  Yes [provider]  Multiple Vitamins-Minerals (MULTIVITAMIN WOMEN 50+ PO) Take 1 tablet by mouth daily.   Yes [provider]  OVER THE COUNTER MEDICATION Take 1 capsule by mouth daily. Tumeric/Curcumin 500 mg taking    Yes [provider]  QSYMIA 11.25-69 MG CP24 TK 1 C PO D 01/27/17  Yes [provider]    No Known Allergies  Past Surgical History:  Procedure Laterality Date  . ABDOMINAL HYSTERECTOMY    . COLONOSCOPY    . TUBAL LIGATION      Family History  Problem Relation Age of Onset  . Hypertension Father   . Emphysema Maternal Grandmother   . Cancer Maternal Grandfather   . Hypertension Paternal Grandmother   .  Cancer Paternal Grandmother   . Diabetes Paternal Grandmother   . Hyperlipidemia Paternal Grandmother   . Hypertension Paternal Grandfather   . Diabetes Paternal Grandfather   . Hyperlipidemia Paternal Grandfather   . Colon cancer Neg Hx     Social History   Socioeconomic  History  . Marital status: Married    Spouse name: Deidre Ala  . Number of children: 3  . Years of education: Master's Degree  . Highest education level: None  Social Needs  . Financial resource strain: None  . Food insecurity - worry: None  . Food insecurity - inability: None  . Transportation needs - medical: None  . Transportation needs - non-medical: None  Occupational History  . Occupation: TEACHER    Employer: [employer]    Comment: Science writer Ed for [employer]  Tobacco Use  . Smoking status: Never Smoker  . Smokeless tobacco: Never Used  Substance and Sexual Activity  . Alcohol use: No    Alcohol/week: 0.0 oz  . Drug use: No  . Sexual activity: No    Partners: Male    Comment: husband is impotent, no other partners  Other Topics Concern  . None  Social History Narrative   Informally separated from husband since 11/2016.   Lives alone, with her dog.       Review of Systems  Constitutional: Negative.   HENT: Positive for congestion, sinus pressure and sneezing. Negative for dental problem, drooling, ear discharge, ear pain, facial swelling, hearing loss, mouth sores, nosebleeds, postnasal drip, rhinorrhea, sinus pain, sore throat, tinnitus, trouble swallowing and voice change.   Eyes: Negative.   Respiratory: Negative.   Cardiovascular: Negative.   Gastrointestinal: Negative.   Endocrine: Negative.   Genitourinary: Negative.   Musculoskeletal: Negative.   Skin: Negative.   Allergic/Immunologic: Negative.   Neurological: Negative.   Hematological: Negative.   Psychiatric/Behavioral: Negative.         Objective:  Physical Exam  Constitutional: She is oriented to person, place, and time. Vital signs are normal. She appears well-developed and well-nourished. She is active and cooperative. No distress.  BP 118/82 (BP Location: Left Arm, Patient Position: Sitting, Cuff Size: Large)   Pulse 97   Temp 97.9 F (36.6 C) (Oral)   Resp 18   Ht 5' 4.75"  (1.645 m)   Wt 236 lb 12.8 oz (107.4 kg)   SpO2 97%   BMI 39.71 kg/m    HENT:  Head: Normocephalic and atraumatic.  Right Ear: Hearing, tympanic membrane, external ear and ear canal normal. No foreign bodies.  Left Ear: Hearing, tympanic membrane, external ear and ear canal normal. No foreign bodies.  Nose: Nose normal.  Mouth/Throat: Uvula is midline, oropharynx is clear and moist and mucous membranes are normal. No oral lesions. Normal dentition. No dental abscesses or uvula swelling. No oropharyngeal exudate.  Eyes: Conjunctivae, EOM and lids are normal. Pupils are equal, round, and reactive to light. Right eye exhibits no discharge. Left eye exhibits no discharge. No scleral icterus.  Fundoscopic exam:      The right eye shows no arteriolar narrowing, no AV nicking, no exudate, no hemorrhage and no papilledema. The right eye shows red reflex.       The left eye shows no arteriolar narrowing, no AV nicking, no exudate, no hemorrhage and no papilledema. The left eye shows red reflex.  Neck: Trachea normal, normal range of motion and full passive range of motion without pain. Neck supple. No spinous process tenderness and no  muscular tenderness present. No thyroid mass and no thyromegaly present.  Cardiovascular: Normal rate, regular rhythm, normal heart sounds, intact distal pulses and normal pulses.  Pulmonary/Chest: Effort normal and breath sounds normal. Right breast exhibits no inverted nipple, no mass, no nipple discharge, no skin change and no tenderness. Left breast exhibits no inverted nipple, no mass, no nipple discharge, no skin change and no tenderness. Breasts are symmetrical.  Abdominal: Soft. Bowel sounds are normal.  Musculoskeletal: She exhibits no edema or tenderness.       Cervical back: Normal.       Thoracic back: Normal.       Lumbar back: Normal.  Lymphadenopathy:       Head (right side): No tonsillar, no preauricular, no posterior auricular and no occipital  adenopathy present.       Head (left side): No tonsillar, no preauricular, no posterior auricular and no occipital adenopathy present.    She has no cervical adenopathy.       Right: No supraclavicular adenopathy present.       Left: No supraclavicular adenopathy present.  Neurological: She is alert and oriented to person, place, and time. She has normal strength and normal reflexes. No cranial nerve deficit. She exhibits normal muscle tone. Coordination and gait normal.  Skin: Skin is warm, dry and intact. No rash noted. She is not diaphoretic. No cyanosis or erythema. Nails show no clubbing.  Psychiatric: She has a normal mood and affect. Her speech is normal and behavior is normal. Judgment and thought content normal.    Visual Acuity Screening   Right eye Left eye Both eyes  Without correction:     With correction: 20/20 20/20 20/20     Wt Readings from Last 3 Encounters:  02/07/17 236 lb 12.8 oz (107.4 kg)  01/16/16 272 lb 9.6 oz (123.7 kg)  01/31/15 255 lb 12.8 oz (116 kg)       Assessment & Plan:   Problem List Items Addressed This Visit    HTN (hypertension) (Chronic)    Good control. COntinue healthy lifestyle changes and weight loss. Continue lisinopril-HCTZ.      Relevant Orders   CBC with Differential/Platelet (Completed)   Comprehensive metabolic panel (Completed)   TSH (Completed)   Urinalysis, dipstick only (Completed)   BMI 39.0-39.9,adult    Weight is down 36 lbs in the past 12 months. I do not prescribe phentermine. Rx for topiramate. Referral to MWM for additional counseling and treatment.      Relevant Medications   topiramate (TOPAMAX) 100 MG tablet   Other Relevant Orders   Amb Ref to Medical Weight Management   Prediabetes    Weight loss may have resolved this. Update A1C.      Relevant Orders   Comprehensive metabolic panel (Completed)   Hemoglobin A1c (Completed)   Lipid panel (Completed)   Rosacea    Stable. Continue metro-gel.       Relevant Medications   metroNIDAZOLE (METROCREAM) 0.75 % cream   OSA (obstructive sleep apnea)    Resolved with weight loss.       Other Visit Diagnoses    Annual physical exam    -  Primary   Age appropriate health guidance provided.   Need for influenza vaccination       received at work.       Return in about 3 months (around 05/08/2017) for re-evalaution of weight, mood, etc.   Fara Chute, PA-C [facility]

## 2017-02-07 NOTE — Patient Instructions (Addendum)
STOP the Qsymia. START topiramate. You can reduce to 50 mg daily, if desired, by taking 1/2 of the 100 mg tablet. [facility] will contact you to schedule (they have been on a wait list, so be patient!). Keep up the great eating habits and exercise!    IF you received an x-ray today, you will receive an invoice from [facility]. Please contact [facility] at [phone] with questions or concerns regarding your invoice.   IF you received labwork today, you will receive an invoice from [facility]. Please contact [facility] at [phone] with questions or concerns regarding your invoice.   Our billing staff will not be able to assist you with questions regarding bills from these companies.  You will be contacted with the lab results as soon as they are available. The fastest way to get your results is to activate your My Chart account. Instructions are located on the last page of this paperwork. If you have not heard from [place] regarding the results in 2 weeks, please contact this office.     Preventive Care 40-64 Years, Female Preventive care refers to lifestyle choices and visits with your health care provider that can promote health and wellness. What does preventive care include?  A yearly physical exam. This is also called an annual well check.  Dental exams once or twice a year.  Routine eye exams. Ask your health care provider how often you should have your eyes checked.  Personal lifestyle choices, including: ? Daily care of your teeth and gums. ? Regular physical activity. ? Eating a healthy diet. ? Avoiding tobacco and drug use. ? Limiting alcohol use. ? Practicing safe sex. ? Taking low-dose aspirin daily starting at age 68. ? Taking vitamin and mineral supplements as recommended by your health care provider. What happens during an annual well check? The services and screenings done by your health care provider during your annual  well check will depend on your age, overall health, lifestyle risk factors, and family history of disease. Counseling Your health care provider may ask you questions about your:  Alcohol use.  Tobacco use.  Drug use.  Emotional well-being.  Home and relationship well-being.  Sexual activity.  Eating habits.  Work and work Statistician.  Method of birth control.  Menstrual cycle.  Pregnancy history.  Screening You may have the following tests or measurements:  Height, weight, and BMI.  Blood pressure.  Lipid and cholesterol levels. These may be checked every 5 years, or more frequently if you are over 72 years old.  Skin check.  Lung cancer screening. You may have this screening every year starting at age 13 if you have a 30-pack-year history of smoking and currently smoke or have quit within the past 15 years.  Fecal occult blood test (FOBT) of the stool. You may have this test every year starting at age 25.  Flexible sigmoidoscopy or colonoscopy. You may have a sigmoidoscopy every 5 years or a colonoscopy every 10 years starting at age 52.  Hepatitis C blood test.  Hepatitis B blood test.  Sexually transmitted disease (STD) testing.  Diabetes screening. This is done by checking your blood sugar (glucose) after you have not eaten for a while (fasting). You may have this done every 1-3 years.  Mammogram. This may be done every 1-2 years. Talk to your health care provider about when you should start having regular mammograms. This may depend on whether you have a family history of breast cancer.  BRCA-related cancer screening. This may be done if you have a family history of breast, ovarian, tubal, or peritoneal cancers.  Pelvic exam and Pap test. This may be done every 3 years starting at age 40. Starting at age 65, this may be done every 5 years if you have a Pap test in combination with an HPV test.  Bone density scan. This is done to screen for osteoporosis.  You may have this scan if you are at high risk for osteoporosis.  Discuss your test results, treatment options, and if necessary, the need for more tests with your health care provider. Vaccines Your health care provider may recommend certain vaccines, such as:  Influenza vaccine. This is recommended every year.  Tetanus, diphtheria, and acellular pertussis (Tdap, Td) vaccine. You may need a Td booster every 10 years.  Varicella vaccine. You may need this if you have not been vaccinated.  Zoster vaccine. You may need this after age 43.  Measles, mumps, and rubella (MMR) vaccine. You may need at least one dose of MMR if you were born in 1957 or later. You may also need a second dose.  Pneumococcal 13-valent conjugate (PCV13) vaccine. You may need this if you have certain conditions and were not previously vaccinated.  Pneumococcal polysaccharide (PPSV23) vaccine. You may need one or two doses if you smoke cigarettes or if you have certain conditions.  Meningococcal vaccine. You may need this if you have certain conditions.  Hepatitis A vaccine. You may need this if you have certain conditions or if you travel or work in places where you may be exposed to hepatitis A.  Hepatitis B vaccine. You may need this if you have certain conditions or if you travel or work in places where you may be exposed to hepatitis B.  Haemophilus influenzae type b (Hib) vaccine. You may need this if you have certain conditions.  Talk to your health care provider about which screenings and vaccines you need and how often you need them. This information is not intended to replace advice given to you by your health care provider. Make sure you discuss any questions you have with your health care provider. Document Released: 01/27/2015 Document Revised: 09/20/2015 Document Reviewed: 11/01/2014 Elsevier Interactive Patient Education  [employer].

## 2017-02-08 ENCOUNTER — Encounter: Payer: Self-pay | Admitting: Physician Assistant

## 2017-02-08 LAB — COMPREHENSIVE METABOLIC PANEL
ALBUMIN: 4.4 g/dL (ref 3.5–5.5)
ALT: 33 IU/L — ABNORMAL HIGH (ref 0–32)
AST: 19 IU/L (ref 0–40)
Albumin/Globulin Ratio: 1.5 (ref 1.2–2.2)
Alkaline Phosphatase: 98 IU/L (ref 39–117)
BILIRUBIN TOTAL: 0.6 mg/dL (ref 0.0–1.2)
BUN / CREAT RATIO: 16 (ref 9–23)
BUN: 14 mg/dL (ref 6–24)
CALCIUM: 9.6 mg/dL (ref 8.7–10.2)
CHLORIDE: 101 mmol/L (ref 96–106)
CO2: 24 mmol/L (ref 20–29)
Creatinine, Ser: 0.89 mg/dL (ref 0.57–1.00)
GFR, EST AFRICAN AMERICAN: 84 mL/min/{1.73_m2} (ref >59)
GFR, EST NON AFRICAN AMERICAN: 73 mL/min/{1.73_m2} (ref >59)
GLUCOSE: 96 mg/dL (ref 65–99)
Globulin, Total: 3 g/dL (ref 1.5–4.5)
Potassium: 4 mmol/L (ref 3.5–5.2)
Sodium: 140 mmol/L (ref 134–144)
TOTAL PROTEIN: 7.4 g/dL (ref 6.0–8.5)

## 2017-02-08 LAB — URINALYSIS, DIPSTICK ONLY
Bilirubin, UA: NEGATIVE
Glucose, UA: NEGATIVE
KETONES UA: NEGATIVE
Leukocytes, UA: NEGATIVE
NITRITE UA: NEGATIVE
PH UA: 5.5 (ref 5.0–7.5)
Protein, UA: NEGATIVE
RBC, UA: NEGATIVE
Specific Gravity, UA: 1.022 (ref 1.005–1.030)
UUROB: 0.2 mg/dL (ref 0.2–1.0)

## 2017-02-08 LAB — CBC WITH DIFFERENTIAL/PLATELET
BASOS ABS: 0 10*3/uL (ref 0.0–0.2)
BASOS: 0 %
EOS (ABSOLUTE): 0.1 10*3/uL (ref 0.0–0.4)
Eos: 2 %
HEMOGLOBIN: 14.6 g/dL (ref 11.1–15.9)
Hematocrit: 41.9 % (ref 34.0–46.6)
IMMATURE GRANS (ABS): 0 10*3/uL (ref 0.0–0.1)
IMMATURE GRANULOCYTES: 0 %
LYMPHS: 36 %
Lymphocytes Absolute: 3.1 10*3/uL (ref 0.7–3.1)
MCH: 30.2 pg (ref 26.6–33.0)
MCHC: 34.8 g/dL (ref 31.5–35.7)
MCV: 87 fL (ref 79–97)
MONOCYTES: 8 %
Monocytes Absolute: 0.7 10*3/uL (ref 0.1–0.9)
Neutrophils Absolute: 4.7 10*3/uL (ref 1.4–7.0)
Neutrophils: 54 %
Platelets: 223 10*3/uL (ref 150–379)
RBC: 4.83 x10E6/uL (ref 3.77–5.28)
RDW: 13.6 % (ref 12.3–15.4)
WBC: 8.6 10*3/uL (ref 3.4–10.8)

## 2017-02-08 LAB — HEMOGLOBIN A1C
Est. average glucose Bld gHb Est-mCnc: 114 mg/dL
Hgb A1c MFr Bld: 5.6 % (ref 4.8–5.6)

## 2017-02-08 LAB — TSH: TSH: 2.71 u[IU]/mL (ref 0.450–4.500)

## 2017-02-08 LAB — LIPID PANEL
CHOL/HDL RATIO: 3.5 ratio (ref 0.0–4.4)
Cholesterol, Total: 140 mg/dL (ref 100–199)
HDL: 40 mg/dL (ref >39)
LDL CALC: 55 mg/dL (ref 0–99)
TRIGLYCERIDES: 224 mg/dL — AB (ref 0–149)
VLDL CHOLESTEROL CAL: 45 mg/dL — AB (ref 5–40)

## 2017-02-08 NOTE — Assessment & Plan Note (Signed)
Weight is down 36 lbs in the past 12 months. I do not prescribe phentermine. Rx for topiramate. Referral to MWM for additional counseling and treatment.

## 2017-02-08 NOTE — Assessment & Plan Note (Signed)
Good control. COntinue healthy lifestyle changes and weight loss. Continue lisinopril-HCTZ.

## 2017-02-08 NOTE — Assessment & Plan Note (Signed)
Weight loss may have resolved this. Update A1C.

## 2017-02-08 NOTE — Assessment & Plan Note (Signed)
Stable. Continue metro-gel.

## 2017-02-08 NOTE — Assessment & Plan Note (Signed)
Resolved with weight loss.   

## 2017-02-10 ENCOUNTER — Encounter: Payer: Self-pay | Admitting: Physician Assistant

## 2017-02-17 ENCOUNTER — Other Ambulatory Visit: Payer: Self-pay | Admitting: Physician Assistant

## 2017-02-17 DIAGNOSIS — I10 Essential (primary) hypertension: Secondary | ICD-10-CM

## 2017-02-25 ENCOUNTER — Other Ambulatory Visit: Payer: Self-pay

## 2017-02-25 ENCOUNTER — Telehealth: Payer: Self-pay | Admitting: Physician Assistant

## 2017-02-25 NOTE — Telephone Encounter (Signed)
Patient called and informed she doesn't need another office visit that the message was from her prior refills when she needed the appointment. I advised the request for a 90 day supply with the next refill will be sent to the provider, she verbalized understanding.

## 2017-02-25 NOTE — Telephone Encounter (Signed)
Copied from [place]. Topic: Quick Communication - Rx Refill/Question >> Feb 25, 2017 11:15 AM Arletha Grippe wrote: Medication: lisinopril-hydrochlorothiazide (PRINZIDE,ZESTORETIC) 10-12.5 MG tablet    Has the patient contacted their pharmacy? Yes.     (Agent: If no, request that the patient contact the pharmacy for the refill.)   Preferred Pharmacy (with phone number or street name): [facility]   --pt got rx  - it was 30 qty instead of 90 , and on the label it said to make appt with pcp -  Pt had cpe on 02/07/17 and wants to know if she needs another appt, and why? Please call [phone]    Agent: Please be advised that RX refills may take up to 3 business days. We ask that you follow-up with your pharmacy.

## 2017-02-26 NOTE — Telephone Encounter (Signed)
Please clarify which pharmacy she will use.  Please authorize #[id number], RF x 1.

## 2017-02-27 ENCOUNTER — Other Ambulatory Visit: Payer: Self-pay

## 2017-02-27 DIAGNOSIS — I10 Essential (primary) hypertension: Secondary | ICD-10-CM

## 2017-02-27 MED ORDER — LISINOPRIL-HYDROCHLOROTHIAZIDE 10-12.5 MG PO TABS: 1.0000 | ORAL_TABLET | Freq: Every day | ORAL | 1 refills | Status: DC

## 2017-02-27 NOTE — Telephone Encounter (Signed)
Rx sent to [facility] and pt notified

## 2017-03-27 ENCOUNTER — Encounter (INDEPENDENT_AMBULATORY_CARE_PROVIDER_SITE_OTHER): Payer: BC Managed Care – PPO

## 2017-04-15 ENCOUNTER — Encounter (INDEPENDENT_AMBULATORY_CARE_PROVIDER_SITE_OTHER): Payer: Self-pay | Admitting: Family Medicine

## 2017-04-15 ENCOUNTER — Ambulatory Visit (INDEPENDENT_AMBULATORY_CARE_PROVIDER_SITE_OTHER): Payer: BC Managed Care – PPO | Admitting: Family Medicine

## 2017-04-15 VITALS — Ht 64.0 in | Wt 238.0 lb

## 2017-04-15 DIAGNOSIS — Z6841 Body Mass Index (BMI) 40.0 and over, adult: Secondary | ICD-10-CM

## 2017-04-15 DIAGNOSIS — Z0289 Encounter for other administrative examinations: Secondary | ICD-10-CM

## 2017-04-15 DIAGNOSIS — I10 Essential (primary) hypertension: Secondary | ICD-10-CM

## 2017-04-15 DIAGNOSIS — Z1331 Encounter for screening for depression: Secondary | ICD-10-CM | POA: Diagnosis not present

## 2017-04-15 DIAGNOSIS — Z9189 Other specified personal risk factors, not elsewhere classified: Secondary | ICD-10-CM

## 2017-04-15 DIAGNOSIS — R5383 Other fatigue: Secondary | ICD-10-CM

## 2017-04-15 DIAGNOSIS — R0602 Shortness of breath: Secondary | ICD-10-CM | POA: Diagnosis not present

## 2017-04-15 NOTE — Progress Notes (Signed)
Office: [phone]  /  Fax: [phone]   Dear Harrison Mons, PA-C,  Thank you for referring Catherine Trevino to our clinic. The following note includes my evaluation and treatment recommendations.  HPI:   Chief Complaint: OBESITY    TAYGEN NEWSOME has been referred by Harrison Mons, PA-C for consultation regarding her obesity and obesity related comorbidities.    LAGINA READER (MR# [id number]) is a 57 y.o. female who presents on 04/15/2017 for obesity evaluation and treatment. Current BMI is Body mass index is 40.85 kg/m.Joelene Millin has been struggling with her weight for many years and has been unsuccessful in either losing weight, maintaining weight loss, or reaching her healthy weight goal.     Jodie has been on Qsymia, now on topiramate.     Kalandra attended our information session and states she is currently in the action stage of change and ready to dedicate time achieving and maintaining a healthier weight. Beatrix is interested in becoming our patient and working on intensive lifestyle modifications including (but not limited to) diet, exercise and weight loss.    Whitney states her family eats meals together she thinks her family will eat healthier with  her her desired weight loss is 98 lbs she has been heavy most of  her life she started gaining weight in 9's her heaviest weight ever was 289 lbs she has significant food cravings issues  she skips meals frequently she frequently makes poor food choices she frequently eats larger portions than normal  she struggles with emotional eating    Fatigue Shelle feels her energy is lower than it should be. This has worsened with weight gain and has not worsened recently. Loza admits to daytime somnolence and  admits to waking up still tired. Patient is at risk for obstructive sleep apnea. Patent has a history of symptoms of daytime fatigue. Patient generally gets 7 hours of sleep per night, and states they  generally have generally restful sleep. Snoring is present. Apneic episodes are present. Epworth Sleepiness Score is 4.  Dyspnea on exertion Joelene Millin notes increasing shortness of breath with exercising and seems to be worsening over time with weight gain. She notes getting out of breath sooner with activity than she used to. This has not gotten worse recently. Ariell denies orthopnea.  Hypertension RAINBOW SALMAN is a 57 y.o. female with hypertension. Stefanee's blood pressure is stable on lisinopril and she denies chest pain or headache. She is working weight loss to help control her blood pressure with the goal of decreasing her risk of heart attack and stroke. Abel's blood pressure is currently controlled.  At risk for cardiovascular disease Yanilen is at a higher than average risk for cardiovascular disease due to obesity and hypertension. She currently denies any chest pain.  Depression Screen Jasha's Food and Mood (modified PHQ-9) score was  Depression screen PHQ 2/9 04/15/2017  Decreased Interest 1  Down, Depressed, Hopeless 1  PHQ - 2 Score 2  Altered sleeping 1  Tired, decreased energy 1  Change in appetite 2  Feeling bad or failure about yourself  1  Trouble concentrating 1  Moving slowly or fidgety/restless 1  Suicidal thoughts 0  PHQ-9 Score 9  Difficult doing work/chores Not difficult at all    ALLERGIES: No Known Allergies  MEDICATIONS: Current Outpatient Medications on File Prior to Visit  Medication Sig Dispense Refill  . lisinopril-hydrochlorothiazide (PRINZIDE,ZESTORETIC) 10-12.5 MG tablet TAKE 1 TABLET DAILY. PLEASEMAKE AN APPOINTMENT WITH   YOUR DOCTOR 30  tablet 0  . Multiple Vitamins-Minerals (MULTIVITAMIN WOMEN 50+ PO) Take 1 tablet by mouth daily.    Marland Kitchen omega-3 acid ethyl esters (LOVAZA) 1 g capsule Take by mouth daily.    Marland Kitchen OVER THE COUNTER MEDICATION Take 1 capsule by mouth daily. Tumeric/Curcumin 500 mg taking     . topiramate (TOPAMAX) 100  MG tablet Take 1 tablet (100 mg total) by mouth daily. 90 tablet 3   No current facility-administered medications on file prior to visit.     PAST MEDICAL HISTORY: Past Medical History:  Diagnosis Date  . Anemia   . ANGIODYSPLASIA, INTESTINE, WITHOUT HEMORRHAGE 08/16/2009   Qualifier: Diagnosis of  By: Carlean Purl MD, Dimas Millin Diabetes mellitus without complication ([place])    pt stated prediabetes, not on meds  . Diverticulitis   . GERD (gastroesophageal reflux disease)   . Hx of adenomatous colonic polyps 12/29/2014  . Hypertension   . Obesity   . OSA (obstructive sleep apnea) 01/06/2016   Mild. CPAP discontinued 12/2016 following significant weight loss.   . OSA on CPAP 01/06/2016    PAST SURGICAL HISTORY: Past Surgical History:  Procedure Laterality Date  . ABDOMINAL HYSTERECTOMY    . COLONOSCOPY    . TUBAL LIGATION      SOCIAL HISTORY: Social History   Tobacco Use  . Smoking status: Never Smoker  . Smokeless tobacco: Never Used  Substance Use Topics  . Alcohol use: No    Alcohol/week: 0.0 oz  . Drug use: No    FAMILY HISTORY: Family History  Problem Relation Age of Onset  . Hypertension Father   . Emphysema Maternal Grandmother   . Cancer Maternal Grandfather   . Hypertension Paternal Grandmother   . Cancer Paternal Grandmother   . Diabetes Paternal Grandmother   . Hyperlipidemia Paternal Grandmother   . Hypertension Paternal Grandfather   . Diabetes Paternal Grandfather   . Hyperlipidemia Paternal Grandfather   . Colon cancer Neg Hx     ROS: Review of Systems  Constitutional: Positive for malaise/fatigue. Negative for weight loss.       + Trouble sleeping  HENT: Positive for nosebleeds.   Eyes:       + Wear glasses or contacts + Floaters  Respiratory: Positive for shortness of breath (with exertion).   Cardiovascular: Negative for orthopnea.  Psychiatric/Behavioral: Positive for depression. Negative for suicidal ideas.    PHYSICAL  EXAM: Height 5\' 4"  (1.626 m), weight 238 lb (108 kg). Body mass index is 40.85 kg/m. Physical Exam  Constitutional: She is oriented to person, place, and time. She appears well-developed and well-nourished.  HENT:  Head: Normocephalic and atraumatic.  Nose: Nose normal.  Eyes: EOM are normal. No scleral icterus.  Neck: Normal range of motion. Neck supple. No thyromegaly present.  Cardiovascular: Normal rate and regular rhythm.  Pulmonary/Chest: Effort normal. No respiratory distress.  Abdominal: Soft. There is no tenderness.  + Obesity  Musculoskeletal:  Range of Motion normal in all 4 extremities Trace edema noted in bilateral lower extremities  Neurological: She is alert and oriented to person, place, and time. Coordination normal.  Skin: Skin is warm and dry.  Psychiatric: She has a normal mood and affect. Her behavior is normal.  Vitals reviewed.   RECENT LABS AND TESTS: BMET    Component Value Date/Time   NA 140 02/07/2017 1157   K 4.0 02/07/2017 1157   CL 101 02/07/2017 1157   CO2 24 02/07/2017 1157   GLUCOSE 96 02/07/2017  1157   GLUCOSE 138 (H) 03/10/2016 0919   BUN 14 02/07/2017 1157   CREATININE 0.89 02/07/2017 1157   CREATININE 0.86 01/31/2015 1129   CALCIUM 9.6 02/07/2017 1157   GFRNONAA 73 02/07/2017 1157   GFRAA 84 02/07/2017 1157   Lab Results  Component Value Date   HGBA1C 5.6 02/07/2017   No results found for: INSULIN CBC    Component Value Date/Time   WBC 8.6 02/07/2017 1157   WBC 12.7 (H) 03/10/2016 0909   RBC 4.83 02/07/2017 1157   RBC 4.80 03/10/2016 0909   HGB 14.6 02/07/2017 1157   HCT 41.9 02/07/2017 1157   PLT 223 02/07/2017 1157   MCV 87 02/07/2017 1157   MCH 30.2 02/07/2017 1157   MCH 29.2 03/10/2016 0909   MCHC 34.8 02/07/2017 1157   MCHC 34.6 03/10/2016 0909   RDW 13.6 02/07/2017 1157   LYMPHSABS 3.1 02/07/2017 1157   MONOABS 1.0 03/10/2016 0909   EOSABS 0.1 02/07/2017 1157   BASOSABS 0.0 02/07/2017 1157    Iron/TIBC/Ferritin/ %Sat    Component Value Date/Time   FERRITIN 16 06/29/2009   Lipid Panel     Component Value Date/Time   CHOL 140 02/07/2017 1157   TRIG 224 (H) 02/07/2017 1157   HDL 40 02/07/2017 1157   CHOLHDL 3.5 02/07/2017 1157   CHOLHDL 3.5 01/31/2015 1129   VLDL 31 (H) 01/31/2015 1129   LDLCALC 55 02/07/2017 1157   Hepatic Function Panel     Component Value Date/Time   PROT 7.4 02/07/2017 1157   ALBUMIN 4.4 02/07/2017 1157   AST 19 02/07/2017 1157   ALT 33 (H) 02/07/2017 1157   ALKPHOS 98 02/07/2017 1157   BILITOT 0.6 02/07/2017 1157   BILIDIR 0.2 03/10/2016 0909   IBILI 0.7 03/10/2016 0909      Component Value Date/Time   TSH 2.710 02/07/2017 1157   TSH 3.310 01/16/2016 1141   TSH 4.267 10/25/2014 1449    ECG  shows NSR with a rate of 70 BPM INDIRECT CALORIMETER done today shows a VO2 of 291 and a REE of 2022.  Her calculated basal metabolic rate is 6237 thus her basal metabolic rate is better than expected.    ASSESSMENT AND PLAN: Other fatigue - Plan: EKG 12-Lead, Vitamin B12, CBC With Differential, Folate, Hemoglobin A1c, Insulin, random, Lipid Panel With LDL/HDL Ratio, T3, T4, free, TSH, VITAMIN D 25 Hydroxy (Vit-D Deficiency, Fractures)  Shortness of breath on exertion - Plan: CBC With Differential  Essential hypertension - Plan: Comprehensive metabolic panel  Depression screening  At risk for heart disease  Class 3 severe obesity with serious comorbidity and body mass index (BMI) of 40.0 to 44.9 in adult, unspecified obesity type (HCC)  PLAN:  Fatigue Ragena was informed that her fatigue may be related to obesity, depression or many other causes. Labs will be ordered, and in the meanwhile Hiya has agreed to work on diet, exercise and weight loss to help with fatigue. Proper sleep hygiene was discussed including the need for 7-8 hours of quality sleep each night. A sleep study was not ordered based on symptoms and Epworth  score.  Dyspnea on exertion Bayler's shortness of breath appears to be obesity related and exercise induced. She has agreed to work on weight loss and gradually increase exercise to treat her exercise induced shortness of breath. If Sharnetta follows our instructions and loses weight without improvement of her shortness of breath, we will plan to refer to pulmonology. We will monitor this condition  regularly. Keiona agrees to this plan.  Hypertension We discussed sodium restriction, working on healthy weight loss, and a regular exercise program as the means to achieve improved blood pressure control. Helena agreed with this plan and agreed to follow up as directed. We will continue to monitor her blood pressure as well as her progress with the above lifestyle modifications. She will continue diet, exercise and continue taking lisinopril as prescribed and will watch for signs of hypotension as she continues her lifestyle modifications. We will check labs and Jaye agrees to follow up with our clinic in 2 weeks.  Cardiovascular risk counselling Mignon was given extended (15 minutes) coronary artery disease prevention counseling today. She is 57 y.o. female and has risk factors for heart disease including obesity and hypertension. We discussed intensive lifestyle modifications today with an emphasis on specific weight loss instructions and strategies. Pt was also informed of the importance of increasing exercise and decreasing saturated fats to help prevent heart disease.  Depression Screen Eilene had a mildly positive depression screening. Depression is commonly associated with obesity and often results in emotional eating behaviors. We will monitor this closely and work on CBT to help improve the non-hunger eating patterns. Referral to Psychology may be required if no improvement is seen as she continues in our clinic.  Obesity Jesly is currently in the action stage of change and her  goal is to continue with weight loss efforts. I recommend Shunta begin the structured treatment plan as follows:  She has agreed to follow the Category 3 plan Shontay has been instructed to eventually work up to a goal of 150 minutes of combined cardio and strengthening exercise per week for weight loss and overall health benefits. We discussed the following Behavioral Modification Strategies today: increasing lean protein intake, decreasing simple carbohydrates  and work on meal planning and easy cooking plans   She was informed of the importance of frequent follow up visits to maximize her success with intensive lifestyle modifications for her multiple health conditions. She was informed we would discuss her lab results at her next visit unless there is a critical issue that needs to be addressed sooner. Makira agreed to keep her next visit at the agreed upon time to discuss these results.    OBESITY BEHAVIORAL INTERVENTION VISIT  Today's visit was # 1 out of 22.  Starting weight: 238 lbs Starting date: 04/15/17 Today's weight : 238 lbs  Today's date: 04/15/2017 Total lbs lost to date: 0 (Patients must lose 7 lbs in the first 6 months to continue with counseling)   ASK: We discussed the diagnosis of obesity with Donata Duff today and Ewa agreed to give [place] permission to discuss obesity behavioral modification therapy today.  ASSESS: Morgann has the diagnosis of obesity and her BMI today is 40.83 Darion is in the action stage of change   ADVISE: Kerianna was educated on the multiple health risks of obesity as well as the benefit of weight loss to improve her health. She was advised of the need for long term treatment and the importance of lifestyle modifications.  AGREE: Multiple dietary modification options and treatment options were discussed and  Aubery agreed to the above obesity treatment plan.   I, Trixie Dredge, am acting as transcriptionist for Dennard Nip, MD   I have reviewed the above documentation for accuracy and completeness, and I agree with the above. -Dennard Nip, MD

## 2017-04-16 LAB — CBC WITH DIFFERENTIAL
BASOS ABS: 0 10*3/uL (ref 0.0–0.2)
Basos: 0 %
EOS (ABSOLUTE): 0.1 10*3/uL (ref 0.0–0.4)
EOS: 1 %
HEMATOCRIT: 41.8 % (ref 34.0–46.6)
HEMOGLOBIN: 14.2 g/dL (ref 11.1–15.9)
IMMATURE GRANS (ABS): 0 10*3/uL (ref 0.0–0.1)
IMMATURE GRANULOCYTES: 0 %
LYMPHS: 35 %
Lymphocytes Absolute: 2.5 10*3/uL (ref 0.7–3.1)
MCH: 29.6 pg (ref 26.6–33.0)
MCHC: 34 g/dL (ref 31.5–35.7)
MCV: 87 fL (ref 79–97)
Monocytes Absolute: 0.5 10*3/uL (ref 0.1–0.9)
Monocytes: 6 %
NEUTROS ABS: 4.1 10*3/uL (ref 1.4–7.0)
Neutrophils: 58 %
RBC: 4.79 x10E6/uL (ref 3.77–5.28)
RDW: 13.9 % (ref 12.3–15.4)
WBC: 7.2 10*3/uL (ref 3.4–10.8)

## 2017-04-16 LAB — COMPREHENSIVE METABOLIC PANEL
ALBUMIN: 4.2 g/dL (ref 3.5–5.5)
ALT: 35 IU/L — ABNORMAL HIGH (ref 0–32)
AST: 23 IU/L (ref 0–40)
Albumin/Globulin Ratio: 1.6 (ref 1.2–2.2)
Alkaline Phosphatase: 84 IU/L (ref 39–117)
BUN / CREAT RATIO: 23 (ref 9–23)
BUN: 22 mg/dL (ref 6–24)
Bilirubin Total: 0.4 mg/dL (ref 0.0–1.2)
CO2: 19 mmol/L — AB (ref 20–29)
CREATININE: 0.96 mg/dL (ref 0.57–1.00)
Calcium: 9.4 mg/dL (ref 8.7–10.2)
Chloride: 103 mmol/L (ref 96–106)
GFR, EST AFRICAN AMERICAN: 76 mL/min/{1.73_m2} (ref >59)
GFR, EST NON AFRICAN AMERICAN: 66 mL/min/{1.73_m2} (ref >59)
GLUCOSE: 96 mg/dL (ref 65–99)
Globulin, Total: 2.6 g/dL (ref 1.5–4.5)
Potassium: 3.8 mmol/L (ref 3.5–5.2)
Sodium: 142 mmol/L (ref 134–144)
TOTAL PROTEIN: 6.8 g/dL (ref 6.0–8.5)

## 2017-04-16 LAB — LIPID PANEL WITH LDL/HDL RATIO
CHOLESTEROL TOTAL: 153 mg/dL (ref 100–199)
HDL: 50 mg/dL (ref >39)
LDL Calculated: 68 mg/dL (ref 0–99)
LDl/HDL Ratio: 1.4 ratio (ref 0.0–3.2)
Triglycerides: 177 mg/dL — ABNORMAL HIGH (ref 0–149)
VLDL CHOLESTEROL CAL: 35 mg/dL (ref 5–40)

## 2017-04-16 LAB — T4, FREE: FREE T4: 1.07 ng/dL (ref 0.82–1.77)

## 2017-04-16 LAB — TSH: TSH: 2.43 u[IU]/mL (ref 0.450–4.500)

## 2017-04-16 LAB — INSULIN, RANDOM: INSULIN: 18.2 u[IU]/mL (ref 2.6–24.9)

## 2017-04-16 LAB — VITAMIN D 25 HYDROXY (VIT D DEFICIENCY, FRACTURES): Vit D, 25-Hydroxy: 31.1 ng/mL (ref 30.0–100.0)

## 2017-04-16 LAB — HEMOGLOBIN A1C
Est. average glucose Bld gHb Est-mCnc: 117 mg/dL
Hgb A1c MFr Bld: 5.7 % — ABNORMAL HIGH (ref 4.8–5.6)

## 2017-04-16 LAB — VITAMIN B12: VITAMIN B 12: 584 pg/mL (ref 232–1245)

## 2017-04-16 LAB — T3: T3, Total: 129 ng/dL (ref 71–180)

## 2017-04-16 LAB — FOLATE: Folate: >20 ng/mL (ref >3.0)

## 2017-04-21 ENCOUNTER — Encounter: Payer: Self-pay | Admitting: Physician Assistant

## 2017-04-28 ENCOUNTER — Ambulatory Visit (INDEPENDENT_AMBULATORY_CARE_PROVIDER_SITE_OTHER): Payer: BC Managed Care – PPO | Admitting: Family Medicine

## 2017-04-28 VITALS — BP 128/82 | HR 82 | Ht 64.0 in | Wt 234.0 lb

## 2017-04-28 DIAGNOSIS — E559 Vitamin D deficiency, unspecified: Secondary | ICD-10-CM | POA: Diagnosis not present

## 2017-04-28 DIAGNOSIS — Z6841 Body Mass Index (BMI) 40.0 and over, adult: Secondary | ICD-10-CM | POA: Diagnosis not present

## 2017-04-28 DIAGNOSIS — Z9189 Other specified personal risk factors, not elsewhere classified: Secondary | ICD-10-CM

## 2017-04-28 DIAGNOSIS — R7303 Prediabetes: Secondary | ICD-10-CM

## 2017-04-28 MED ORDER — VITAMIN D (ERGOCALCIFEROL) 1.25 MG (50000 UNIT) PO CAPS: 50000.0000 [IU] | ORAL_CAPSULE | ORAL | 0 refills | Status: DC

## 2017-04-29 NOTE — Progress Notes (Signed)
Office: [phone]  /  Fax: [phone]   HPI:   Chief Complaint: OBESITY Catherine Trevino is here to discuss her progress with her obesity treatment plan. She is on the Category 3 plan and is following her eating plan approximately 99 % of the time. She states she is walking for 30-45 minutes 3 times per week. Catherine Trevino did very well with weight loss but struggled to eat all her protein at night. Hunger was controlled and she did well with decreasing eating out.  Her weight is 234 lb (106.1 kg) today and has had a weight loss of 4 pounds over a period of 2 weeks since her last visit. She has lost 4 lbs since starting treatment with [place].  Vitamin D Deficiency Catherine Trevino has a new diagnosis of vitamin D deficiency. She is on OTC multivitamin, levels not yet at goal. She denies nausea, vomiting or muscle weakness.  Pre-Diabetes Catherine Trevino has a new diagnosis of pre-diabetes based on her elevated Hgb A1c and was informed this puts her at greater risk of developing diabetes. A1c and insulin elevated, she notes polyphagia but this improved in the last 2 weeks sue to change in diet prescription even with weight loss. She is not taking metformin currently and continues to work on diet and exercise to decrease risk of diabetes. She denies nausea or hypoglycemia.  At risk for diabetes Catherine Trevino is at higher than average risk for developing diabetes due to her obesity and pre-diabetes. She currently denies polyuria or polydipsia.  ALLERGIES: No Known Allergies  MEDICATIONS: Current Outpatient Medications on File Prior to Visit  Medication Sig Dispense Refill  . lisinopril-hydrochlorothiazide (PRINZIDE,ZESTORETIC) 10-12.5 MG tablet TAKE 1 TABLET DAILY. PLEASEMAKE AN APPOINTMENT WITH   YOUR DOCTOR 30 tablet 0  . Multiple Vitamins-Minerals (MULTIVITAMIN WOMEN 50+ PO) Take 1 tablet by mouth daily.    Marland Kitchen omega-3 acid ethyl esters (LOVAZA) 1 g capsule Take by mouth daily.    Marland Kitchen OVER THE COUNTER MEDICATION Take 1  capsule by mouth daily. Tumeric/Curcumin 500 mg taking     . topiramate (TOPAMAX) 100 MG tablet Take 1 tablet (100 mg total) by mouth daily. 90 tablet 3   No current facility-administered medications on file prior to visit.     PAST MEDICAL HISTORY: Past Medical History:  Diagnosis Date  . Anemia   . ANGIODYSPLASIA, INTESTINE, WITHOUT HEMORRHAGE 08/16/2009   Qualifier: Diagnosis of  By: Carlean Purl MD, Dimas Millin Diabetes mellitus without complication ([place])    pt stated prediabetes, not on meds  . Diverticulitis   . GERD (gastroesophageal reflux disease)   . Hx of adenomatous colonic polyps 12/29/2014  . Hypertension   . Obesity   . OSA (obstructive sleep apnea) 01/06/2016   Mild. CPAP discontinued 12/2016 following significant weight loss.   . OSA on CPAP 01/06/2016    PAST SURGICAL HISTORY: Past Surgical History:  Procedure Laterality Date  . ABDOMINAL HYSTERECTOMY    . COLONOSCOPY    . TUBAL LIGATION      SOCIAL HISTORY: Social History   Tobacco Use  . Smoking status: Never Smoker  . Smokeless tobacco: Never Used  Substance Use Topics  . Alcohol use: No    Alcohol/week: 0.0 oz  . Drug use: No    FAMILY HISTORY: Family History  Problem Relation Age of Onset  . Hypertension Father   . Emphysema Maternal Grandmother   . Cancer Maternal Grandfather   . Hypertension Paternal Grandmother   . Cancer Paternal Grandmother   .  Diabetes Paternal Grandmother   . Hyperlipidemia Paternal Grandmother   . Hypertension Paternal Grandfather   . Diabetes Paternal Grandfather   . Hyperlipidemia Paternal Grandfather   . Colon cancer Neg Hx     ROS: Review of Systems  Constitutional: Positive for weight loss.  Gastrointestinal: Negative for nausea and vomiting.  Genitourinary: Negative for frequency.  Musculoskeletal:       Negative muscle weakness  Endo/Heme/Allergies: Negative for polydipsia.       Positive polyphagia Negative hypoglycemia    PHYSICAL  EXAM: Blood pressure 128/82, pulse 82, height 5\' 4"  (1.626 m), weight 234 lb (106.1 kg), SpO2 97 %. Body mass index is 40.17 kg/m. Physical Exam  Constitutional: She is oriented to person, place, and time. She appears well-developed and well-nourished.  Cardiovascular: Normal rate.  Pulmonary/Chest: Effort normal.  Musculoskeletal: Normal range of motion.  Neurological: She is oriented to person, place, and time.  Skin: Skin is warm and dry.  Psychiatric: She has a normal mood and affect. Her behavior is normal.  Vitals reviewed.   RECENT LABS AND TESTS: BMET    Component Value Date/Time   NA 142 04/15/2017 0932   K 3.8 04/15/2017 0932   CL 103 04/15/2017 0932   CO2 19 (L) 04/15/2017 0932   GLUCOSE 96 04/15/2017 0932   GLUCOSE 138 (H) 03/10/2016 0919   BUN 22 04/15/2017 0932   CREATININE 0.96 04/15/2017 0932   CREATININE 0.86 01/31/2015 1129   CALCIUM 9.4 04/15/2017 0932   GFRNONAA 66 04/15/2017 0932   GFRAA 76 04/15/2017 0932   Lab Results  Component Value Date   HGBA1C 5.7 (H) 04/15/2017   HGBA1C 5.6 02/07/2017   HGBA1C 5.9 (H) 01/16/2016   HGBA1C 5.8 (H) 01/31/2015   HGBA1C 6.1 10/29/2014   Lab Results  Component Value Date   INSULIN 18.2 04/15/2017   CBC    Component Value Date/Time   WBC 7.2 04/15/2017 0932   WBC 12.7 (H) 03/10/2016 0909   RBC 4.79 04/15/2017 0932   RBC 4.80 03/10/2016 0909   HGB 14.2 04/15/2017 0932   HCT 41.8 04/15/2017 0932   PLT 223 02/07/2017 1157   MCV 87 04/15/2017 0932   MCH 29.6 04/15/2017 0932   MCH 29.2 03/10/2016 0909   MCHC 34.0 04/15/2017 0932   MCHC 34.6 03/10/2016 0909   RDW 13.9 04/15/2017 0932   LYMPHSABS 2.5 04/15/2017 0932   MONOABS 1.0 03/10/2016 0909   EOSABS 0.1 04/15/2017 0932   BASOSABS 0.0 04/15/2017 0932   Iron/TIBC/Ferritin/ %Sat    Component Value Date/Time   FERRITIN 16 06/29/2009   Lipid Panel     Component Value Date/Time   CHOL 153 04/15/2017 0932   TRIG 177 (H) 04/15/2017 0932   HDL 50  04/15/2017 0932   CHOLHDL 3.5 02/07/2017 1157   CHOLHDL 3.5 01/31/2015 1129   VLDL 31 (H) 01/31/2015 1129   LDLCALC 68 04/15/2017 0932   Hepatic Function Panel     Component Value Date/Time   PROT 6.8 04/15/2017 0932   ALBUMIN 4.2 04/15/2017 0932   AST 23 04/15/2017 0932   ALT 35 (H) 04/15/2017 0932   ALKPHOS 84 04/15/2017 0932   BILITOT 0.4 04/15/2017 0932   BILIDIR 0.2 03/10/2016 0909   IBILI 0.7 03/10/2016 0909      Component Value Date/Time   TSH 2.430 04/15/2017 0932   TSH 2.710 02/07/2017 1157   TSH 3.310 01/16/2016 1141  Results for CHALISE, PE "KIM" (MRN [id number]) as of 04/29/2017 12:49  Ref. Range 04/15/2017 09:32  Vitamin D, 25-Hydroxy Latest Ref Range: 30.0 - 100.0 ng/mL 31.1    ASSESSMENT AND PLAN: Vitamin D deficiency - Plan: Vitamin D, Ergocalciferol, (DRISDOL) 50000 units CAPS capsule  Prediabetes  At risk for diabetes mellitus  Class 3 severe obesity with serious comorbidity and body mass index (BMI) of 40.0 to 44.9 in adult, unspecified obesity type ([place])  PLAN:  Vitamin D Deficiency Catherine Trevino was informed that low vitamin D levels contributes to fatigue and are associated with obesity, breast, and colon cancer. Catherine Trevino agrees to start prescription Vit D @50 ,000 IU every week #4 with no refills. She will follow up for routine testing of vitamin D, at least 2-3 times per year. She was informed of the risk of over-replacement of vitamin D and agrees to not increase her dose unless she discusses this with [place] first. We will recheck labs in 3 months and Catherine Trevino agrees to follow up with our clinic in 2 to 3 weeks.  Pre-Diabetes Catherine Trevino will continue to work on weight loss, diet, exercise, and decreasing simple carbohydrates in her diet to help decrease the risk of diabetes. We dicussed metformin including benefits and risks. She was informed that eating too many simple carbohydrates or too many calories at one sitting increases the likelihood of GI side  effects. Catherine Trevino declined metformin for now and a prescription was not written today. We will recheck labs in 3 months and Catherine Trevino agrees to follow up with our clinic in 2 to 3 weeks as directed to monitor her progress.  Diabetes risk counselling Catherine Trevino was given extended (30 minutes) diabetes prevention counseling today. She is 57 y.o. female and has risk factors for diabetes including obesity and pre-diabetes. We discussed intensive lifestyle modifications today with an emphasis on weight loss as well as increasing exercise and decreasing simple carbohydrates in her diet.  Obesity Catherine Trevino is currently in the action stage of change. As such, her goal is to continue with weight loss efforts She has agreed to follow the Category 3 plan. Gave specific meal changes-lunch at work (night) with some foods from dinner also as snack at work, and use all 3 snacks at work. Catherine Trevino has been instructed to work up to a goal of 150 minutes of combined cardio and strengthening exercise per week for weight loss and overall health benefits. We discussed the following Behavioral Modification Strategies today: increasing lean protein intake and decreasing simple carbohydrates    Catherine Trevino has agreed to follow up with our clinic in 2 to 3 weeks. She was informed of the importance of frequent follow up visits to maximize her success with intensive lifestyle modifications for her multiple health conditions.   OBESITY BEHAVIORAL INTERVENTION VISIT  Today's visit was # 2 out of 22.  Starting weight: 238 lbs Starting date: 04/15/17 Today's weight : 234 lbs Today's date: 04/28/2017 Total lbs lost to date: 4 (Patients must lose 7 lbs in the first 6 months to continue with counseling)   ASK: We discussed the diagnosis of obesity with Catherine Trevino today and Catherine Trevino agreed to give [place] permission to discuss obesity behavioral modification therapy today.  ASSESS: Catherine Trevino has the diagnosis of obesity and her  BMI today is 40.15 Analie is in the action stage of change   ADVISE: Catherine Trevino was educated on the multiple health risks of obesity as well as the benefit of weight loss to improve her health. She was advised of the need for long term treatment and the importance of lifestyle  modifications.  AGREE: Multiple dietary modification options and treatment options were discussed and  Catherine Trevino agreed to the above obesity treatment plan.  I, Trixie Dredge, am acting as transcriptionist for Dennard Nip, MD  I have reviewed the above documentation for accuracy and completeness, and I agree with the above. -Dennard Nip, MD

## 2017-05-09 ENCOUNTER — Ambulatory Visit: Payer: BC Managed Care – PPO | Admitting: Physician Assistant

## 2017-05-20 ENCOUNTER — Ambulatory Visit (INDEPENDENT_AMBULATORY_CARE_PROVIDER_SITE_OTHER): Payer: BC Managed Care – PPO | Admitting: Family Medicine

## 2017-05-20 ENCOUNTER — Other Ambulatory Visit (INDEPENDENT_AMBULATORY_CARE_PROVIDER_SITE_OTHER): Payer: Self-pay | Admitting: Family Medicine

## 2017-05-20 VITALS — BP 121/80 | HR 78 | Temp 98.5°F | Ht 64.0 in | Wt 233.0 lb

## 2017-05-20 DIAGNOSIS — Z6841 Body Mass Index (BMI) 40.0 and over, adult: Secondary | ICD-10-CM | POA: Diagnosis not present

## 2017-05-20 DIAGNOSIS — Z9189 Other specified personal risk factors, not elsewhere classified: Secondary | ICD-10-CM

## 2017-05-20 DIAGNOSIS — E559 Vitamin D deficiency, unspecified: Secondary | ICD-10-CM | POA: Diagnosis not present

## 2017-05-20 DIAGNOSIS — E66813 Obesity, class 3: Secondary | ICD-10-CM

## 2017-05-20 DIAGNOSIS — R7303 Prediabetes: Secondary | ICD-10-CM | POA: Diagnosis not present

## 2017-05-20 MED ORDER — VITAMIN D (ERGOCALCIFEROL) 1.25 MG (50000 UNIT) PO CAPS: 50000.0000 [IU] | ORAL_CAPSULE | ORAL | 0 refills | Status: DC

## 2017-05-21 NOTE — Progress Notes (Signed)
Office: [phone]  /  Fax: [phone]   HPI:   Chief Complaint: OBESITY Catherine Trevino is here to discuss her progress with her obesity treatment plan. She is on the Category 3 plan and is following her eating plan approximately 99 % of the time. She states she is walking and mowing her yard for 30 to 45 minutes 4 times per week. Catherine Trevino continues to do well with the category 3 plan. Hunger is controlled, but she is getting bored, especially with breakfast. She has increased her activity. Her weight is 233 lb (105.7 kg) today and has had a weight loss of 1 pound over a period of 3 weeks since her last visit. She has lost 5 lbs since starting treatment with [place].  Vitamin D deficiency Catherine Trevino has a diagnosis of vitamin D deficiency. She is stable on vit D but is not yet at goal. Catherine Trevino denies nausea, vomiting or muscle weakness.  Pre-Diabetes Catherine Trevino has a diagnosis of pre-diabetes based on her elevated Hgb A1c and was informed this puts her at greater risk of developing diabetes. She is not taking metformin currently and is attempting to control her pre-diabetes with diet. Catherine Trevino continues to work on diet and exercise to decrease risk of diabetes. She denies nausea or hypoglycemia.  At risk for diabetes Catherine Trevino is at higher than average risk for developing diabetes due to her obesity and pre-diabetes. She currently denies polyuria or polydipsia.  ALLERGIES: No Known Allergies  MEDICATIONS: Current Outpatient Medications on File Prior to Visit  Medication Sig Dispense Refill  . lisinopril-hydrochlorothiazide (PRINZIDE,ZESTORETIC) 10-12.5 MG tablet TAKE 1 TABLET DAILY. PLEASEMAKE AN APPOINTMENT WITH   YOUR DOCTOR 30 tablet 0  . Multiple Vitamins-Minerals (MULTIVITAMIN WOMEN 50+ PO) Take 1 tablet by mouth daily.    Marland Kitchen omega-3 acid ethyl esters (LOVAZA) 1 g capsule Take by mouth daily.    Marland Kitchen OVER THE COUNTER MEDICATION Take 1 capsule by mouth daily. Tumeric/Curcumin 500 mg taking       . topiramate (TOPAMAX) 100 MG tablet Take 1 tablet (100 mg total) by mouth daily. 90 tablet 3   No current facility-administered medications on file prior to visit.     PAST MEDICAL HISTORY: Past Medical History:  Diagnosis Date  . Anemia   . 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 Diverticulitis   . GERD (gastroesophageal reflux disease)   . Hx of adenomatous colonic polyps 12/29/2014  . Hypertension   . Obesity   . OSA (obstructive sleep apnea) 01/06/2016   Mild. CPAP discontinued 12/2016 following significant weight loss.   . OSA on CPAP 01/06/2016    PAST SURGICAL HISTORY: Past Surgical History:  Procedure Laterality Date  . ABDOMINAL HYSTERECTOMY    . COLONOSCOPY    . TUBAL LIGATION      SOCIAL HISTORY: Social History   Tobacco Use  . Smoking status: Never Smoker  . Smokeless tobacco: Never Used  Substance Use Topics  . Alcohol use: No    Alcohol/week: 0.0 oz  . Drug use: No    FAMILY HISTORY: Family History  Problem Relation Age of Onset  . Hypertension Father   . Emphysema Maternal Grandmother   . Cancer Maternal Grandfather   . Hypertension Paternal Grandmother   . Cancer Paternal Grandmother   . Diabetes Paternal Grandmother   . Hyperlipidemia Paternal Grandmother   .  Hypertension Paternal Grandfather   . Diabetes Paternal Grandfather   . Hyperlipidemia Paternal Grandfather   . Colon cancer Neg Hx     ROS: Review of Systems  Constitutional: Positive for weight loss.  Gastrointestinal: Negative for nausea and vomiting.  Genitourinary: Negative for frequency.  Musculoskeletal:       Negative for muscle weakness  Endo/Heme/Allergies: Negative for polydipsia.       Negative for hypoglycemia    PHYSICAL EXAM: Blood pressure 121/80, pulse 78, temperature 98.5 F (36.9 C),  temperature source Oral, height 5\' 4"  (1.626 m), weight 233 lb (105.7 kg), SpO2 98 %. Body mass index is 39.99 kg/m. Physical Exam  Constitutional: She is oriented to person, place, and time. She appears well-developed and well-nourished.  Cardiovascular: Normal rate.  Pulmonary/Chest: Effort normal.  Musculoskeletal: Normal range of motion.  Neurological: She is oriented to person, place, and time.  Skin: Skin is warm and dry.  Psychiatric: She has a normal mood and affect. Her behavior is normal.  Vitals reviewed.   RECENT LABS AND TESTS: BMET    Component Value Date/Time   NA 142 04/15/2017 0932   K 3.8 04/15/2017 0932   CL 103 04/15/2017 0932   CO2 19 (L) 04/15/2017 0932   GLUCOSE 96 04/15/2017 0932   GLUCOSE 138 (H) 03/10/2016 0919   BUN 22 04/15/2017 0932   CREATININE 0.96 04/15/2017 0932   CREATININE 0.86 01/31/2015 1129   CALCIUM 9.4 04/15/2017 0932   GFRNONAA 66 04/15/2017 0932   GFRAA 76 04/15/2017 0932   Lab Results  Component Value Date   HGBA1C 5.7 (H) 04/15/2017   HGBA1C 5.6 02/07/2017   HGBA1C 5.9 (H) 01/16/2016   HGBA1C 5.8 (H) 01/31/2015   HGBA1C 6.1 10/29/2014   Lab Results  Component Value Date   INSULIN 18.2 04/15/2017   CBC    Component Value Date/Time   WBC 7.2 04/15/2017 0932   WBC 12.7 (H) 03/10/2016 0909   RBC 4.79 04/15/2017 0932   RBC 4.80 03/10/2016 0909   HGB 14.2 04/15/2017 0932   HCT 41.8 04/15/2017 0932   PLT 223 02/07/2017 1157   MCV 87 04/15/2017 0932   MCH 29.6 04/15/2017 0932   MCH 29.2 03/10/2016 0909   MCHC 34.0 04/15/2017 0932   MCHC 34.6 03/10/2016 0909   RDW 13.9 04/15/2017 0932   LYMPHSABS 2.5 04/15/2017 0932   MONOABS 1.0 03/10/2016 0909   EOSABS 0.1 04/15/2017 0932   BASOSABS 0.0 04/15/2017 0932   Iron/TIBC/Ferritin/ %Sat    Component Value Date/Time   FERRITIN 16 06/29/2009   Lipid Panel     Component Value Date/Time   CHOL 153 04/15/2017 0932   TRIG 177 (H) 04/15/2017 0932   HDL 50 04/15/2017 0932    CHOLHDL 3.5 02/07/2017 1157   CHOLHDL 3.5 01/31/2015 1129   VLDL 31 (H) 01/31/2015 1129   LDLCALC 68 04/15/2017 0932   Hepatic Function Panel     Component Value Date/Time   PROT 6.8 04/15/2017 0932   ALBUMIN 4.2 04/15/2017 0932   AST 23 04/15/2017 0932   ALT 35 (H) 04/15/2017 0932   ALKPHOS 84 04/15/2017 0932   BILITOT 0.4 04/15/2017 0932   BILIDIR 0.2 03/10/2016 0909   IBILI 0.7 03/10/2016 0909      Component Value Date/Time   TSH 2.430 04/15/2017 0932   TSH 2.710 02/07/2017 1157   TSH 3.310 01/16/2016 1141   Results for SOPHIE, TAMEZ "KIM" (MRN [id number]) as of 05/21/2017 15:29  Ref. Range 04/15/2017  09:32  Vitamin D, 25-Hydroxy Latest Ref Range: 30.0 - 100.0 ng/mL 31.1   ASSESSMENT AND PLAN: Vitamin D deficiency - Plan: Vitamin D, Ergocalciferol, (DRISDOL) 50000 units CAPS capsule  Prediabetes  At risk for diabetes mellitus  Class 3 severe obesity with serious comorbidity and body mass index (BMI) of 40.0 to 44.9 in adult, unspecified obesity type ([place])  PLAN:  Vitamin D Deficiency Catherine Trevino was informed that low vitamin D levels contributes to fatigue and are associated with obesity, breast, and colon cancer. She agrees to continue to take prescription Vit D @50 ,000 IU every week #4 with no refills and will follow up for routine testing of vitamin D, at least 2-3 times per year. She was informed of the risk of over-replacement of vitamin D and agrees to not increase her dose unless she discusses this with [place] first. Catherine Trevino agrees to follow up with our clinic in 2 to 3 weeks.  Pre-Diabetes Catherine Trevino will continue to work on weight loss, exercise, and decreasing simple carbohydrates in her diet to help decrease the risk of diabetes. She was informed that eating too many simple carbohydrates or too many calories at one sitting increases the likelihood of GI side effects. Catherine Trevino agreed to follow up with [place] as directed to monitor her progress.  Diabetes risk  counseling Catherine Trevino was given extended (15 minutes) diabetes prevention counseling today. She is 57 y.o. female and has risk factors for diabetes including obesity and pre-diabetes. We discussed intensive lifestyle modifications today with an emphasis on weight loss as well as increasing exercise and decreasing simple carbohydrates in her diet.  Obesity Catherine Trevino is currently in the action stage of change. As such, her goal is to continue with weight loss efforts She has agreed to keep a food journal with 250 to 400 calories and 25 grams of protein at breakfast and follow the Category 3 plan Catherine Trevino has been instructed to work up to a goal of 150 minutes of combined cardio and strengthening exercise per week for weight loss and overall health benefits. We discussed the following Behavioral Modification Strategies today: increase H2O intake, increasing lean protein intake and decreasing simple carbohydrates   Catherine Trevino has agreed to follow up with our clinic in 2 to 3 weeks. She was informed of the importance of frequent follow up visits to maximize her success with intensive lifestyle modifications for her multiple health conditions.   OBESITY BEHAVIORAL INTERVENTION VISIT  Today's visit was # 3 out of 22.  Starting weight: 238 lbs Starting date: 04/15/17 Today's weight : 233 lbs Today's date: 05/20/2017 Total lbs lost to date: 5 (Patients must lose 7 lbs in the first 6 months to continue with counseling)   ASK: We discussed the diagnosis of obesity with Catherine Trevino today and Catherine Trevino agreed to give [place] permission to discuss obesity behavioral modification therapy today.  ASSESS: Ahnika has the diagnosis of obesity and her BMI today is 39.97 Ahmari is in the action stage of change   ADVISE: Ericah was educated on the multiple health risks of obesity as well as the benefit of weight loss to improve her health. She was advised of the need for long term treatment and the  importance of lifestyle modifications.  AGREE: Multiple dietary modification options and treatment options were discussed and  Catherine Trevino agreed to the above obesity treatment plan.  I, Doreene Nest, am acting as transcriptionist for Dennard Nip, MD  I have reviewed the above documentation for accuracy and completeness, and I agree with  the above. -Dennard Nip, MD

## 2017-06-03 ENCOUNTER — Ambulatory Visit (INDEPENDENT_AMBULATORY_CARE_PROVIDER_SITE_OTHER): Payer: BC Managed Care – PPO | Admitting: Family Medicine

## 2017-06-03 VITALS — BP 118/77 | HR 87 | Temp 98.0°F | Ht 64.0 in | Wt 235.0 lb

## 2017-06-03 DIAGNOSIS — Z9189 Other specified personal risk factors, not elsewhere classified: Secondary | ICD-10-CM | POA: Diagnosis not present

## 2017-06-03 DIAGNOSIS — Z6841 Body Mass Index (BMI) 40.0 and over, adult: Secondary | ICD-10-CM

## 2017-06-03 DIAGNOSIS — K5909 Other constipation: Secondary | ICD-10-CM | POA: Diagnosis not present

## 2017-06-03 MED ORDER — POLYETHYLENE GLYCOL 3350 17 GM/SCOOP PO POWD: 17.0000 g | Freq: Every day | ORAL | 0 refills | Status: DC

## 2017-06-04 NOTE — Progress Notes (Signed)
Office: [phone]  /  Fax: [phone]   HPI:   Chief Complaint: OBESITY Catherine Trevino is here to discuss her progress with her obesity treatment plan. She is on the keep a food journal with 250 to 400 calories and 25 grams of protein at breakfast daily and the Category 3 plan and is following her eating plan approximately 95 % of the time. She states she is walking for 30 to 45 minutes 3 to 4 times per week. Catherine Trevino continues to do well with weight loss. She is journaling breakfast and she likes the freedom. Catherine Trevino is not always meeting her protein goals. Her weight is 235 lb (106.6 kg) today and has had a weight gain of 2 pounds over a period of 2 weeks since her last visit. She has lost 3 lbs since starting treatment with [place].  Constipation Catherine Trevino has been taking chewable fiber, but still notes BM in the last few days are very hard and painful. She is not on a laxative. She denies hematochezia or melena.   At risk for cardiovascular disease Catherine Trevino is at a higher than average risk for cardiovascular disease due to obesity. She currently denies any chest pain.  ALLERGIES: No Known Allergies  MEDICATIONS: Current Outpatient Medications on File Prior to Visit  Medication Sig Dispense Refill  . lisinopril-hydrochlorothiazide (PRINZIDE,ZESTORETIC) 10-12.5 MG tablet TAKE 1 TABLET DAILY. PLEASEMAKE AN APPOINTMENT WITH   YOUR DOCTOR 30 tablet 0  . Multiple Vitamins-Minerals (MULTIVITAMIN WOMEN 50+ PO) Take 1 tablet by mouth daily.    Marland Kitchen omega-3 acid ethyl esters (LOVAZA) 1 g capsule Take by mouth daily.    Marland Kitchen OVER THE COUNTER MEDICATION Take 1 capsule by mouth daily. Tumeric/Curcumin 500 mg taking     . topiramate (TOPAMAX) 100 MG tablet Take 1 tablet (100 mg total) by mouth daily. 90 tablet 3  . Vitamin D, Ergocalciferol, (DRISDOL) 50000 units CAPS capsule Take 1 capsule (50,000 Units total) by mouth every 7 (seven) days. 4 capsule 0   No current facility-administered medications on  file prior to visit.     PAST MEDICAL HISTORY: Past Medical History:  Diagnosis Date  . Anemia   . ANGIODYSPLASIA, INTESTINE, WITHOUT HEMORRHAGE 08/16/2009   Qualifier: Diagnosis of  By: Carlean Purl MD, Dimas Millin Diabetes mellitus without complication ([place])    pt stated prediabetes, not on meds  . Diverticulitis   . GERD (gastroesophageal reflux disease)   . Hx of adenomatous colonic polyps 12/29/2014  . Hypertension   . Obesity   . OSA (obstructive sleep apnea) 01/06/2016   Mild. CPAP discontinued 12/2016 following significant weight loss.   . OSA on CPAP 01/06/2016    PAST SURGICAL HISTORY: Past Surgical History:  Procedure Laterality Date  . ABDOMINAL HYSTERECTOMY    . COLONOSCOPY    . TUBAL LIGATION      SOCIAL HISTORY: Social History   Tobacco Use  . Smoking status: Never Smoker  . Smokeless tobacco: Never Used  Substance Use Topics  . Alcohol use: No    Alcohol/week: 0.0 oz  . Drug use: No    FAMILY HISTORY: Family History  Problem Relation Age of Onset  . Hypertension Father   . Emphysema Maternal Grandmother   . Cancer Maternal Grandfather   . Hypertension Paternal Grandmother   . Cancer Paternal Grandmother   . Diabetes Paternal Grandmother   . Hyperlipidemia Paternal Grandmother   . Hypertension Paternal Grandfather   . Diabetes Paternal Grandfather   . Hyperlipidemia Paternal  Grandfather   . Colon cancer Neg Hx     ROS: Review of Systems  Constitutional: Negative for weight loss.  Cardiovascular: Negative for chest pain.  Gastrointestinal: Positive for constipation. Negative for melena.       Negative for hematochezia    PHYSICAL EXAM: Blood pressure 118/77, pulse 87, temperature 98 F (36.7 C), height 5\' 4"  (1.626 m), weight 235 lb (106.6 kg), SpO2 95 %. Body mass index is 40.34 kg/m. Physical Exam  Constitutional: She is oriented to person, place, and time. She appears well-developed and well-nourished.  Cardiovascular: Normal  rate.  Pulmonary/Chest: Effort normal.  Musculoskeletal: Normal range of motion.  Neurological: She is oriented to person, place, and time.  Skin: Skin is warm and dry.  Psychiatric: She has a normal mood and affect. Her behavior is normal.  Vitals reviewed.   RECENT LABS AND TESTS: BMET    Component Value Date/Time   NA 142 04/15/2017 0932   K 3.8 04/15/2017 0932   CL 103 04/15/2017 0932   CO2 19 (L) 04/15/2017 0932   GLUCOSE 96 04/15/2017 0932   GLUCOSE 138 (H) 03/10/2016 0919   BUN 22 04/15/2017 0932   CREATININE 0.96 04/15/2017 0932   CREATININE 0.86 01/31/2015 1129   CALCIUM 9.4 04/15/2017 0932   GFRNONAA 66 04/15/2017 0932   GFRAA 76 04/15/2017 0932   Lab Results  Component Value Date   HGBA1C 5.7 (H) 04/15/2017   HGBA1C 5.6 02/07/2017   HGBA1C 5.9 (H) 01/16/2016   HGBA1C 5.8 (H) 01/31/2015   HGBA1C 6.1 10/29/2014   Lab Results  Component Value Date   INSULIN 18.2 04/15/2017   CBC    Component Value Date/Time   WBC 7.2 04/15/2017 0932   WBC 12.7 (H) 03/10/2016 0909   RBC 4.79 04/15/2017 0932   RBC 4.80 03/10/2016 0909   HGB 14.2 04/15/2017 0932   HCT 41.8 04/15/2017 0932   PLT 223 02/07/2017 1157   MCV 87 04/15/2017 0932   MCH 29.6 04/15/2017 0932   MCH 29.2 03/10/2016 0909   MCHC 34.0 04/15/2017 0932   MCHC 34.6 03/10/2016 0909   RDW 13.9 04/15/2017 0932   LYMPHSABS 2.5 04/15/2017 0932   MONOABS 1.0 03/10/2016 0909   EOSABS 0.1 04/15/2017 0932   BASOSABS 0.0 04/15/2017 0932   Iron/TIBC/Ferritin/ %Sat    Component Value Date/Time   FERRITIN 16 06/29/2009   Lipid Panel     Component Value Date/Time   CHOL 153 04/15/2017 0932   TRIG 177 (H) 04/15/2017 0932   HDL 50 04/15/2017 0932   CHOLHDL 3.5 02/07/2017 1157   CHOLHDL 3.5 01/31/2015 1129   VLDL 31 (H) 01/31/2015 1129   LDLCALC 68 04/15/2017 0932   Hepatic Function Panel     Component Value Date/Time   PROT 6.8 04/15/2017 0932   ALBUMIN 4.2 04/15/2017 0932   AST 23 04/15/2017 0932     ALT 35 (H) 04/15/2017 0932   ALKPHOS 84 04/15/2017 0932   BILITOT 0.4 04/15/2017 0932   BILIDIR 0.2 03/10/2016 0909   IBILI 0.7 03/10/2016 0909      Component Value Date/Time   TSH 2.430 04/15/2017 0932   TSH 2.710 02/07/2017 1157   TSH 3.310 01/16/2016 1141   Results for RAMEY, KETCHERSIDE "KIM" (MRN [id number]) as of 06/04/2017 15:54  Ref. Range 04/15/2017 09:32  Vitamin D, 25-Hydroxy Latest Ref Range: 30.0 - 100.0 ng/mL 31.1   ASSESSMENT AND PLAN: Other constipation - Plan: polyethylene glycol powder (GLYCOLAX/MIRALAX) powder  At risk for  heart disease  Class 3 severe obesity with serious comorbidity and body mass index (BMI) of 40.0 to 44.9 in adult, unspecified obesity type ([place])  PLAN:  Constipation Catherine Trevino was informed decrease bowel movement frequency is normal while losing weight, but stools should not be hard or painful. She agreed to start Miralax 17 grams  Every 12 hours as needed #1 bottle and follow up in 2 to 3 weeks.  Cardiovascular risk counseling Catherine Trevino was given extended (15 minutes) coronary artery disease prevention counseling today. She is 57 y.o. female and has risk factors for heart disease including obesity. We discussed intensive lifestyle modifications today with an emphasis on specific weight loss instructions and strategies. Pt was also informed of the importance of increasing exercise and decreasing saturated fats to help prevent heart disease.  Obesity Catherine Trevino is currently in the action stage of change. As such, her goal is to continue with weight loss efforts She has agreed to keep a food journal with 250 to 400 calories and 25 grams of protein at breakfast daily and follow the Category 3 plan Catherine Trevino has been instructed to work up to a goal of 150 minutes of combined cardio and strengthening exercise per week for weight loss and overall health benefits. We discussed the following Behavioral Modification Strategies today: increasing lean protein  intake, decreasing simple carbohydrates  and decrease eating out  Catherine Trevino has agreed to follow up with our clinic in 2 to 3 weeks. She was informed of the importance of frequent follow up visits to maximize her success with intensive lifestyle modifications for her multiple health conditions.   OBESITY BEHAVIORAL INTERVENTION VISIT  Today's visit was # 4 out of 22.  Starting weight: 238 lbs Starting date: 04/15/17 Today's weight : 235 lbs Today's date: 06/03/2017 Total lbs lost to date: 3 (Patients must lose 7 lbs in the first 6 months to continue with counseling)   ASK: We discussed the diagnosis of obesity with Catherine Trevino today and Annai agreed to give [place] permission to discuss obesity behavioral modification therapy today.  ASSESS: Tonea has the diagnosis of obesity and her BMI today is 40.32 Alle is in the action stage of change   ADVISE: Tinlee was educated on the multiple health risks of obesity as well as the benefit of weight loss to improve her health. She was advised of the need for long term treatment and the importance of lifestyle modifications.  AGREE: Multiple dietary modification options and treatment options were discussed and  Catherine Trevino agreed to the above obesity treatment plan.  I, Doreene Nest, am acting as transcriptionist for Dennard Nip, MD  I have reviewed the above documentation for accuracy and completeness, and I agree with the above. -Dennard Nip, MD

## 2017-06-13 ENCOUNTER — Other Ambulatory Visit (INDEPENDENT_AMBULATORY_CARE_PROVIDER_SITE_OTHER): Payer: Self-pay | Admitting: Family Medicine

## 2017-06-13 DIAGNOSIS — E559 Vitamin D deficiency, unspecified: Secondary | ICD-10-CM

## 2017-06-24 ENCOUNTER — Ambulatory Visit (INDEPENDENT_AMBULATORY_CARE_PROVIDER_SITE_OTHER): Payer: BC Managed Care – PPO | Admitting: Family Medicine

## 2017-06-24 VITALS — BP 112/73 | HR 89 | Temp 97.9°F | Ht 64.0 in | Wt 232.0 lb

## 2017-06-24 DIAGNOSIS — Z6841 Body Mass Index (BMI) 40.0 and over, adult: Secondary | ICD-10-CM

## 2017-06-24 DIAGNOSIS — E559 Vitamin D deficiency, unspecified: Secondary | ICD-10-CM

## 2017-06-24 DIAGNOSIS — Z9189 Other specified personal risk factors, not elsewhere classified: Secondary | ICD-10-CM

## 2017-06-24 MED ORDER — VITAMIN D (ERGOCALCIFEROL) 1.25 MG (50000 UNIT) PO CAPS: 50000.0000 [IU] | ORAL_CAPSULE | ORAL | 0 refills | Status: DC

## 2017-06-24 NOTE — Progress Notes (Signed)
Office: [phone]  /  Fax: [phone]   HPI:   Chief Complaint: OBESITY Catherine Trevino is here to discuss her progress with her obesity treatment plan. She is on the keep a food journal with 250-400 calories and 25 grams of protein at breakfast daily and follow the Category 3 plan and is following her eating plan approximately 90 % of the time. She states she is walking for 30-45 minutes 3-4 times per week. [name] continues to do well with Category 3 plan. She likes the structure and routine this offers. Hunger is controlled.  Her weight is 232 lb (105.2 kg) today and has had a weight loss of 3 pounds over a period of 3 weeks since her last visit. She has lost 6 lbs since starting treatment with [place].  Vitamin D Deficiency [name] has a diagnosis of vitamin D deficiency. She is stable on prescription Vit D, not yet at goal. She notes fatigue improving and denies nausea, vomiting or muscle weakness.  At risk for cardiovascular disease Girl is at a higher than average risk for cardiovascular disease due to obesity. She currently denies any chest pain.  ALLERGIES: No Known Allergies  MEDICATIONS: Current Outpatient Medications on File Prior to Visit  Medication Sig Dispense Refill  . lisinopril-hydrochlorothiazide (PRINZIDE,ZESTORETIC) 10-12.5 MG tablet TAKE 1 TABLET DAILY. PLEASEMAKE AN APPOINTMENT WITH   YOUR DOCTOR 30 tablet 0  . Multiple Vitamins-Minerals (MULTIVITAMIN WOMEN 50+ PO) Take 1 tablet by mouth daily.    Marland Kitchen omega-3 acid ethyl esters (LOVAZA) 1 g capsule Take by mouth daily.    Marland Kitchen OVER THE COUNTER MEDICATION Take 1 capsule by mouth daily. Tumeric/Curcumin 500 mg taking     . polyethylene glycol powder (GLYCOLAX/MIRALAX) powder Take 17 g by mouth daily. Take every 12 hours 3350 g 0  . topiramate (TOPAMAX) 100 MG tablet Take 1 tablet (100 mg total) by mouth daily. 90 tablet 3   No current facility-administered medications on file prior to visit.     PAST MEDICAL  HISTORY: Past Medical History:  Diagnosis Date  . Anemia   . 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 Diverticulitis   . GERD (gastroesophageal reflux disease)   . Hx of adenomatous colonic polyps 12/29/2014  . Hypertension   . Obesity   . OSA (obstructive sleep apnea) 01/06/2016   Mild. CPAP discontinued 12/2016 following significant weight loss.   . OSA on CPAP 01/06/2016    PAST SURGICAL HISTORY: Past Surgical History:  Procedure Laterality Date  . ABDOMINAL HYSTERECTOMY    . COLONOSCOPY    . TUBAL LIGATION      SOCIAL HISTORY: Social History   Tobacco Use  . Smoking status: Never Smoker  . Smokeless tobacco: Never Used  Substance Use Topics  . Alcohol use: No    Alcohol/week: 0.0 oz  . Drug use: No    FAMILY HISTORY: Family History  Problem Relation Age of Onset  . Hypertension Father   . Emphysema Maternal Grandmother   . Cancer Maternal Grandfather   . Hypertension Paternal Grandmother   . Cancer Paternal Grandmother   . Diabetes Paternal Grandmother   . Hyperlipidemia Paternal Grandmother   . Hypertension Paternal Grandfather   . Diabetes Paternal Grandfather   . Hyperlipidemia Paternal Grandfather   . Colon cancer Neg Hx     ROS: Review  of Systems  Constitutional: Positive for malaise/fatigue and weight loss.  Cardiovascular: Negative for chest pain.  Gastrointestinal: Negative for nausea and vomiting.  Musculoskeletal:       Negative muscle weakness    PHYSICAL EXAM: Blood pressure 112/73, pulse 89, temperature 97.9 F (36.6 C), temperature source Oral, height 5\' 4"  (1.626 m), weight 232 lb (105.2 kg), SpO2 98 %. Body mass index is 39.82 kg/m. Physical Exam  Constitutional: She is oriented to person, place, and time. She appears well-developed and well-nourished.  Cardiovascular:  Normal rate.  Pulmonary/Chest: Effort normal.  Musculoskeletal: Normal range of motion.  Neurological: She is oriented to person, place, and time.  Skin: Skin is warm and dry.  Psychiatric: She has a normal mood and affect. Her behavior is normal.  Vitals reviewed.   RECENT LABS AND TESTS: BMET    Component Value Date/Time   NA 142 04/15/2017 0932   K 3.8 04/15/2017 0932   CL 103 04/15/2017 0932   CO2 19 (L) 04/15/2017 0932   GLUCOSE 96 04/15/2017 0932   GLUCOSE 138 (H) 03/10/2016 0919   BUN 22 04/15/2017 0932   CREATININE 0.96 04/15/2017 0932   CREATININE 0.86 01/31/2015 1129   CALCIUM 9.4 04/15/2017 0932   GFRNONAA 66 04/15/2017 0932   GFRAA 76 04/15/2017 0932   Lab Results  Component Value Date   HGBA1C 5.7 (H) 04/15/2017   HGBA1C 5.6 02/07/2017   HGBA1C 5.9 (H) 01/16/2016   HGBA1C 5.8 (H) 01/31/2015   HGBA1C 6.1 10/29/2014   Lab Results  Component Value Date   INSULIN 18.2 04/15/2017   CBC    Component Value Date/Time   WBC 7.2 04/15/2017 0932   WBC 12.7 (H) 03/10/2016 0909   RBC 4.79 04/15/2017 0932   RBC 4.80 03/10/2016 0909   HGB 14.2 04/15/2017 0932   HCT 41.8 04/15/2017 0932   PLT 223 02/07/2017 1157   MCV 87 04/15/2017 0932   MCH 29.6 04/15/2017 0932   MCH 29.2 03/10/2016 0909   MCHC 34.0 04/15/2017 0932   MCHC 34.6 03/10/2016 0909   RDW 13.9 04/15/2017 0932   LYMPHSABS 2.5 04/15/2017 0932   MONOABS 1.0 03/10/2016 0909   EOSABS 0.1 04/15/2017 0932   BASOSABS 0.0 04/15/2017 0932   Iron/TIBC/Ferritin/ %Sat    Component Value Date/Time   FERRITIN 16 06/29/2009   Lipid Panel     Component Value Date/Time   CHOL 153 04/15/2017 0932   TRIG 177 (H) 04/15/2017 0932   HDL 50 04/15/2017 0932   CHOLHDL 3.5 02/07/2017 1157   CHOLHDL 3.5 01/31/2015 1129   VLDL 31 (H) 01/31/2015 1129   LDLCALC 68 04/15/2017 0932   Hepatic Function Panel     Component Value Date/Time   PROT 6.8 04/15/2017 0932   ALBUMIN 4.2 04/15/2017 0932   AST 23 04/15/2017  0932   ALT 35 (H) 04/15/2017 0932   ALKPHOS 84 04/15/2017 0932   BILITOT 0.4 04/15/2017 0932   BILIDIR 0.2 03/10/2016 0909   IBILI 0.7 03/10/2016 0909      Component Value Date/Time   TSH 2.430 04/15/2017 0932   TSH 2.710 02/07/2017 1157   TSH 3.310 01/16/2016 1141  Results for MADELIENE, TEJERA "KIM" (MRN [id number]) as of 06/24/2017 16:05  Ref. Range 04/15/2017 09:32  Vitamin D, 25-Hydroxy Latest Ref Range: 30.0 - 100.0 ng/mL 31.1    ASSESSMENT AND PLAN: Vitamin D deficiency - Plan: Vitamin D, Ergocalciferol, (DRISDOL) 50000 units CAPS capsule  At risk for heart disease  Class 3  severe obesity with serious comorbidity and body mass index (BMI) of 40.0 to 44.9 in adult, unspecified obesity type ([place])  PLAN:  Vitamin D Deficiency Rashay was informed that low vitamin D levels contributes to fatigue and are associated with obesity, breast, and colon cancer. Emalina agrees to continue taking prescription Vit D @50 ,000 IU every week #4 and we will refill for 1 month. She will follow up for routine testing of vitamin D, at least 2-3 times per year. She was informed of the risk of over-replacement of vitamin D and agrees to not increase her dose unless she discusses this with [place] first. Jerrilynn agrees to follow up with our clinic in 3 weeks.  Cardiovascular risk counselling Levita was given extended (15 minutes) coronary artery disease prevention counseling today. She is 57 y.o. female and has risk factors for heart disease including obesity. We discussed intensive lifestyle modifications today with an emphasis on specific weight loss instructions and strategies. Pt was also informed of the importance of increasing exercise and decreasing saturated fats to help prevent heart disease.  Obesity Ranisha is currently in the action stage of change. As such, her goal is to continue with weight loss efforts She has agreed to keep a food journal with 250-400 calories and 25 grams of protein  at breakfast daily and follow the Category 3 plan [name] has been instructed to work up to a goal of 150 minutes of combined cardio and strengthening exercise per week for weight loss and overall health benefits. We discussed the following Behavioral Modification Strategies today: increasing lean protein intake, decreasing simple carbohydrates  and work on meal planning and easy cooking plans   Tayen has agreed to follow up with our clinic in 3 weeks. She was informed of the importance of frequent follow up visits to maximize her success with intensive lifestyle modifications for her multiple health conditions.   OBESITY BEHAVIORAL INTERVENTION VISIT  Today's visit was # 5 out of 22.  Starting weight: 238 lbs Starting date: 04/15/17 Today's weight : 232 lbs  Today's date: 06/24/2017 Total lbs lost to date: 6 (Patients must lose 7 lbs in the first 6 months to continue with counseling)   ASK: We discussed the diagnosis of obesity with Donata Duff today and Ellieanna agreed to give [place] permission to discuss obesity behavioral modification therapy today.  ASSESS: Nautika has the diagnosis of obesity and her BMI today is 39.8 Shahrzad is in the action stage of change   ADVISE: Rianne was educated on the multiple health risks of obesity as well as the benefit of weight loss to improve her health. She was advised of the need for long term treatment and the importance of lifestyle modifications.  AGREE: Multiple dietary modification options and treatment options were discussed and  Teren agreed to the above obesity treatment plan.  I, Trixie Dredge, am acting as transcriptionist for Dennard Nip, MD  I have reviewed the above documentation for accuracy and completeness, and I agree with the above. -Dennard Nip, MD

## 2017-07-10 ENCOUNTER — Ambulatory Visit (INDEPENDENT_AMBULATORY_CARE_PROVIDER_SITE_OTHER): Payer: BC Managed Care – PPO | Admitting: Family Medicine

## 2017-07-10 VITALS — BP 111/75 | HR 86 | Temp 97.5°F | Ht 64.0 in | Wt 229.0 lb

## 2017-07-10 DIAGNOSIS — Z6839 Body mass index (BMI) 39.0-39.9, adult: Secondary | ICD-10-CM

## 2017-07-10 DIAGNOSIS — E559 Vitamin D deficiency, unspecified: Secondary | ICD-10-CM

## 2017-07-14 NOTE — Progress Notes (Signed)
Office: [phone]  /  Fax: [phone]   HPI:   Chief Complaint: OBESITY Carol is here to discuss her progress with her obesity treatment plan. She is on the keep a food journal with 250-400 calories and 25 grams of protein at breakfast daily and follow the Category 3 plan and is following her eating plan approximately 70 % of the time. She states she is exercising 0 minutes 0 times per week. Shay is struggling to follow plan due to increased home stressors. She did well with planning breakfast and lunch however reports not eating dinner regularly.  Her weight is 229 lb (103.9 kg) today and has had a weight loss of 3 pounds over a period of 2 weeks since her last visit. She has lost 9 lbs since starting treatment with [place].  Vitamin D Deficiency Mckenze has a diagnosis of vitamin D deficiency. She is currently taking prescription Vit D, last level not at goal. She denies nausea, vomiting or muscle weakness.  ALLERGIES: No Known Allergies  MEDICATIONS: Current Outpatient Medications on File Prior to Visit  Medication Sig Dispense Refill  . lisinopril-hydrochlorothiazide (PRINZIDE,ZESTORETIC) 10-12.5 MG tablet TAKE 1 TABLET DAILY. PLEASEMAKE AN APPOINTMENT WITH   YOUR DOCTOR 30 tablet 0  . Multiple Vitamins-Minerals (MULTIVITAMIN WOMEN 50+ PO) Take 1 tablet by mouth daily.    Marland Kitchen omega-3 acid ethyl esters (LOVAZA) 1 g capsule Take by mouth daily.    Marland Kitchen OVER THE COUNTER MEDICATION Take 1 capsule by mouth daily. Tumeric/Curcumin 500 mg taking     . polyethylene glycol powder (GLYCOLAX/MIRALAX) powder Take 17 g by mouth daily. Take every 12 hours 3350 g 0  . topiramate (TOPAMAX) 100 MG tablet Take 1 tablet (100 mg total) by mouth daily. 90 tablet 3  . Vitamin D, Ergocalciferol, (DRISDOL) 50000 units CAPS capsule Take 1 capsule (50,000 Units total) by mouth every 7 (seven) days. 4 capsule 0   No current facility-administered medications on file prior to visit.     PAST MEDICAL  HISTORY: Past Medical History:  Diagnosis Date  . Anemia   . 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 Diverticulitis   . GERD (gastroesophageal reflux disease)   . Hx of adenomatous colonic polyps 12/29/2014  . Hypertension   . Obesity   . OSA (obstructive sleep apnea) 01/06/2016   Mild. CPAP discontinued 12/2016 following significant weight loss.   . OSA on CPAP 01/06/2016    PAST SURGICAL HISTORY: Past Surgical History:  Procedure Laterality Date  . ABDOMINAL HYSTERECTOMY    . COLONOSCOPY    . TUBAL LIGATION      SOCIAL HISTORY: Social History   Tobacco Use  . Smoking status: Never Smoker  . Smokeless tobacco: Never Used  Substance Use Topics  . Alcohol use: No    Alcohol/week: 0.0 oz  . Drug use: No    FAMILY HISTORY: Family History  Problem Relation Age of Onset  . Hypertension Father   . Emphysema Maternal Grandmother   . Cancer Maternal Grandfather   . Hypertension Paternal Grandmother   . Cancer Paternal Grandmother   . Diabetes Paternal Grandmother   . Hyperlipidemia Paternal Grandmother   . Hypertension Paternal Grandfather   . Diabetes Paternal Grandfather   . Hyperlipidemia Paternal Grandfather   . Colon cancer Neg Hx     ROS: Review  of Systems  Constitutional: Positive for weight loss.  Gastrointestinal: Negative for nausea and vomiting.  Musculoskeletal:       Negative muscle weakness    PHYSICAL EXAM: Blood pressure 111/75, pulse 86, temperature (!) 97.5 F (36.4 C), temperature source Oral, height 5\' 4"  (1.626 m), weight 229 lb (103.9 kg), SpO2 96 %. Body mass index is 39.31 kg/m. Physical Exam  Constitutional: She is oriented to person, place, and time. She appears well-developed and well-nourished.  Cardiovascular: Normal rate.  Pulmonary/Chest: Effort normal.    Musculoskeletal: Normal range of motion.  Neurological: She is oriented to person, place, and time.  Skin: Skin is warm and dry.  Psychiatric: She has a normal mood and affect. Her behavior is normal.  Vitals reviewed.   RECENT LABS AND TESTS: BMET    Component Value Date/Time   NA 142 04/15/2017 0932   K 3.8 04/15/2017 0932   CL 103 04/15/2017 0932   CO2 19 (L) 04/15/2017 0932   GLUCOSE 96 04/15/2017 0932   GLUCOSE 138 (H) 03/10/2016 0919   BUN 22 04/15/2017 0932   CREATININE 0.96 04/15/2017 0932   CREATININE 0.86 01/31/2015 1129   CALCIUM 9.4 04/15/2017 0932   GFRNONAA 66 04/15/2017 0932   GFRAA 76 04/15/2017 0932   Lab Results  Component Value Date   HGBA1C 5.7 (H) 04/15/2017   HGBA1C 5.6 02/07/2017   HGBA1C 5.9 (H) 01/16/2016   HGBA1C 5.8 (H) 01/31/2015   HGBA1C 6.1 10/29/2014   Lab Results  Component Value Date   INSULIN 18.2 04/15/2017   CBC    Component Value Date/Time   WBC 7.2 04/15/2017 0932   WBC 12.7 (H) 03/10/2016 0909   RBC 4.79 04/15/2017 0932   RBC 4.80 03/10/2016 0909   HGB 14.2 04/15/2017 0932   HCT 41.8 04/15/2017 0932   PLT 223 02/07/2017 1157   MCV 87 04/15/2017 0932   MCH 29.6 04/15/2017 0932   MCH 29.2 03/10/2016 0909   MCHC 34.0 04/15/2017 0932   MCHC 34.6 03/10/2016 0909   RDW 13.9 04/15/2017 0932   LYMPHSABS 2.5 04/15/2017 0932   MONOABS 1.0 03/10/2016 0909   EOSABS 0.1 04/15/2017 0932   BASOSABS 0.0 04/15/2017 0932   Iron/TIBC/Ferritin/ %Sat    Component Value Date/Time   FERRITIN 16 06/29/2009   Lipid Panel     Component Value Date/Time   CHOL 153 04/15/2017 0932   TRIG 177 (H) 04/15/2017 0932   HDL 50 04/15/2017 0932   CHOLHDL 3.5 02/07/2017 1157   CHOLHDL 3.5 01/31/2015 1129   VLDL 31 (H) 01/31/2015 1129   LDLCALC 68 04/15/2017 0932   Hepatic Function Panel     Component Value Date/Time   PROT 6.8 04/15/2017 0932   ALBUMIN 4.2 04/15/2017 0932   AST 23 04/15/2017 0932   ALT 35 (H) 04/15/2017 0932   ALKPHOS  84 04/15/2017 0932   BILITOT 0.4 04/15/2017 0932   BILIDIR 0.2 03/10/2016 0909   IBILI 0.7 03/10/2016 0909      Component Value Date/Time   TSH 2.430 04/15/2017 0932   TSH 2.710 02/07/2017 1157   TSH 3.310 01/16/2016 1141  Results for CAMYLLE, WHICKER "KIM" (MRN [id number]) as of 07/14/2017 11:02  Ref. Range 04/15/2017 09:32  Vitamin D, 25-Hydroxy Latest Ref Range: 30.0 - 100.0 ng/mL 31.1    ASSESSMENT AND PLAN: Vitamin D deficiency  Class 2 severe obesity with serious comorbidity and body mass index (BMI) of 39.0 to 39.9 in adult, unspecified obesity type ([place])  PLAN:  Vitamin D Deficiency Ellinore was informed that low vitamin D levels contributes to fatigue and are associated with obesity, breast, and colon cancer. Lynett agrees to continue taking prescription Vit D @50 ,000 IU every week and will follow up for routine testing of vitamin D, at least 2-3 times per year. She was informed of the risk of over-replacement of vitamin D and agrees to not increase her dose unless she discusses this with [place] first. Alleene agrees to follow up with our clinic in 2 to 3 weeks and we will recheck level at that time.  We spent > than 50% of the 15 minute visit on the counseling as documented in the note.  Obesity Kanasia is currently in the action stage of change. As such, her goal is to continue with weight loss efforts She has agreed to keep a food journal with 250-400 calories and 25 grams of protein at breakfast daily and follow the Category 3 plan Chinenye has been instructed to work up to a goal of 150 minutes of combined cardio and strengthening exercise per week for weight loss and overall health benefits. We discussed the following Behavioral Modification Strategies today: increasing lean protein intake, work on meal planning and easy cooking plans, and travel eating strategies  We discussed portable food options for dinner  Kelin has agreed to follow up with our clinic in 2 to 3  weeks. She was informed of the importance of frequent follow up visits to maximize her success with intensive lifestyle modifications for her multiple health conditions.   OBESITY BEHAVIORAL INTERVENTION VISIT  Today's visit was # 6 out of 22.  Starting weight: 238 lbs Starting date: 04/15/17 Today's weight : 229 lbs Today's date: 07/10/2017 Total lbs lost to date: 9 (Patients must lose 7 lbs in the first 6 months to continue with counseling)   ASK: We discussed the diagnosis of obesity with Donata Duff today and Isidora agreed to give [place] permission to discuss obesity behavioral modification therapy today.  ASSESS: Meghen has the diagnosis of obesity and her BMI today is 39.29 Morgann is in the action stage of change   ADVISE: Sumner was educated on the multiple health risks of obesity as well as the benefit of weight loss to improve her health. She was advised of the need for long term treatment and the importance of lifestyle modifications.  AGREE: Multiple dietary modification options and treatment options were discussed and  Yeraldi agreed to the above obesity treatment plan.  I, Trixie Dredge, am acting as transcriptionist for Dennard Nip, MD  I have reviewed the above documentation for accuracy and completeness, and I agree with the above. -Dennard Nip, MD

## 2017-08-05 ENCOUNTER — Ambulatory Visit (INDEPENDENT_AMBULATORY_CARE_PROVIDER_SITE_OTHER): Payer: BC Managed Care – PPO | Admitting: Family Medicine

## 2017-08-05 VITALS — BP 113/74 | HR 72 | Temp 97.6°F | Ht 64.0 in | Wt 236.0 lb

## 2017-08-05 DIAGNOSIS — F3289 Other specified depressive episodes: Secondary | ICD-10-CM

## 2017-08-05 DIAGNOSIS — Z6841 Body Mass Index (BMI) 40.0 and over, adult: Secondary | ICD-10-CM

## 2017-08-05 DIAGNOSIS — R7303 Prediabetes: Secondary | ICD-10-CM

## 2017-08-05 DIAGNOSIS — E782 Mixed hyperlipidemia: Secondary | ICD-10-CM | POA: Diagnosis not present

## 2017-08-05 DIAGNOSIS — E559 Vitamin D deficiency, unspecified: Secondary | ICD-10-CM | POA: Diagnosis not present

## 2017-08-05 DIAGNOSIS — Z9189 Other specified personal risk factors, not elsewhere classified: Secondary | ICD-10-CM | POA: Diagnosis not present

## 2017-08-05 MED ORDER — VITAMIN D (ERGOCALCIFEROL) 1.25 MG (50000 UNIT) PO CAPS: 50000.0000 [IU] | ORAL_CAPSULE | ORAL | 0 refills | Status: DC

## 2017-08-05 MED ORDER — BUPROPION HCL ER (SR) 150 MG PO TB12: 150.0000 mg | ORAL_TABLET | Freq: Every day | ORAL | 0 refills | Status: DC

## 2017-08-05 NOTE — Progress Notes (Signed)
Office: [phone]  /  Fax: [phone]   HPI:   Chief Complaint: OBESITY Catherine Trevino is here to discuss her progress with her obesity treatment plan. She is on the  keep a food journal with 250 to 400 calories and 25 grams of protein at breakfast daily and the Category 3 plan and is following her eating plan approximately 0 % of the time. She states she is exercising 0 minutes 0 times per week. Catherine Trevino has had multiple stressors with family and work; and she hasn't followed her plan closely. She notes increased emotional eating. She wants to at least stop gaining weight. Her weight is 236 lb (107 kg) today and has had a weight gain of 7 pounds over a period of 3 weeks since her last visit. She has lost 2 lbs since starting treatment with [place].  Vitamin D deficiency Catherine Trevino has a diagnosis of vitamin D deficiency. Catherine Trevino is stable on vit D but she is not yet at goal. She denies nausea, vomiting or muscle weakness. Catherine Trevino is due for labs.  Hyperlipidemia (new) Catherine Trevino has hyperlipidemia. Catherine Trevino is attempting to control her cholesterol levels with intensive lifestyle modification including a low saturated fat diet, exercise and weight loss; but she hasn't done well with her diet recently. She is due for labs. She denies any chest pain, claudication or myalgias.  Pre-Diabetes Catherine Trevino has a diagnosis of prediabetes based on her elevated Hgb A1c and was informed this puts her at greater risk of developing diabetes. Catherine Trevino is not taking metformin currently.She is attempting to diet control, but she is not doing as well on her diet recently She denies nausea or hypoglycemia. Catherine Trevino is due for labs.  At risk for diabetes Catherine Trevino is at higher than average risk for developing diabetes due to her obesity and pre-diabetes. She currently denies polyuria or polydipsia.  Depression with emotional eating behaviors Catherine Trevino has multiple significant stressors and has done a lot of emotional  eating while dealing with family and work issues. Catherine Trevino struggles with emotional eating and using food for comfort to the extent that it is negatively impacting her health. She often snacks when she is not hungry. Catherine Trevino sometimes feels she is out of control and then feels guilty that she made poor food choices. She has been working on behavior modification techniques to help reduce her emotional eating and has been somewhat successful. She shows no sign of suicidal or homicidal ideations.  Depression screen [facility] Trevino 2/9 04/15/2017 02/07/2017 01/16/2016 10/25/2014  Decreased Interest 1 0 0 0  Down, Depressed, Hopeless 1 0 0 0  PHQ - 2 Score 2 0 0 0  Altered sleeping 1 - - -  Tired, decreased energy 1 - - -  Change in appetite 2 - - -  Feeling bad or failure about yourself  1 - - -  Trouble concentrating 1 - - -  Moving slowly or fidgety/restless 1 - - -  Suicidal thoughts 0 - - -  PHQ-9 Score 9 - - -  Difficult doing work/chores Not difficult at all - - -     ALLERGIES: No Known Allergies  MEDICATIONS: Current Outpatient Medications on File Prior to Visit  Medication Sig Dispense Refill  . lisinopril-hydrochlorothiazide (PRINZIDE,ZESTORETIC) 10-12.5 MG tablet TAKE 1 TABLET DAILY. PLEASEMAKE AN APPOINTMENT WITH   YOUR DOCTOR 30 tablet 0  . Multiple Vitamins-Minerals (MULTIVITAMIN WOMEN 50+ PO) Take 1 tablet by mouth daily.    Marland Kitchen omega-3 acid ethyl esters (LOVAZA) 1 g capsule Take by mouth  daily.    Marland Kitchen OVER THE COUNTER MEDICATION Take 1 capsule by mouth daily. Tumeric/Curcumin 500 mg taking     . polyethylene glycol powder (GLYCOLAX/MIRALAX) powder Take 17 g by mouth daily. Take every 12 hours 3350 g 0  . topiramate (TOPAMAX) 100 MG tablet Take 1 tablet (100 mg total) by mouth daily. 90 tablet 3   No current facility-administered medications on file prior to visit.     PAST MEDICAL HISTORY: Past Medical History:  Diagnosis Date  . Anemia   . 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 Diverticulitis   . GERD (gastroesophageal reflux disease)   . Hx of adenomatous colonic polyps 12/29/2014  . Hypertension   . Obesity   . OSA (obstructive sleep apnea) 01/06/2016   Mild. CPAP discontinued 12/2016 following significant weight loss.   . OSA on CPAP 01/06/2016    PAST SURGICAL HISTORY: Past Surgical History:  Procedure Laterality Date  . ABDOMINAL HYSTERECTOMY    . COLONOSCOPY    . TUBAL LIGATION      SOCIAL HISTORY: Social History   Tobacco Use  . Smoking status: Never Smoker  . Smokeless tobacco: Never Used  Substance Use Topics  . Alcohol use: No    Alcohol/week: 0.0 oz  . Drug use: No    FAMILY HISTORY: Family History  Problem Relation Age of Onset  . Hypertension Father   . Emphysema Maternal Grandmother   . Cancer Maternal Grandfather   . Hypertension Paternal Grandmother   . Cancer Paternal Grandmother   . Diabetes Paternal Grandmother   . Hyperlipidemia Paternal Grandmother   . Hypertension Paternal Grandfather   . Diabetes Paternal Grandfather   . Hyperlipidemia Paternal Grandfather   . Colon cancer Neg Hx     ROS: Review of Systems  Constitutional: Negative for weight loss.  Cardiovascular: Negative for chest pain and claudication.  Gastrointestinal: Negative for nausea and vomiting.  Genitourinary: Negative for frequency.  Musculoskeletal: Negative for myalgias.       Negative for muscle weakness  Endo/Heme/Allergies: Negative for polydipsia.       Negative for hypoglycemia  Psychiatric/Behavioral: Positive for depression. Negative for suicidal ideas.       Positive for stress    PHYSICAL EXAM: Blood pressure 113/74, pulse 72, temperature 97.6 F (36.4 C), temperature source Oral, height 5\' 4"  (1.626 m), weight 236 lb (107 kg), SpO2 98 %. Body mass index is 40.51  kg/m. Physical Exam  Constitutional: She is oriented to person, place, and time. She appears well-developed and well-nourished.  Cardiovascular: Normal rate.  Pulmonary/Chest: Effort normal.  Musculoskeletal: Normal range of motion.  Neurological: She is oriented to person, place, and time.  Skin: Skin is warm and dry.  Psychiatric: She has a normal mood and affect. Her behavior is normal.  Vitals reviewed.   RECENT LABS AND TESTS: BMET    Component Value Date/Time   NA 142 04/15/2017 0932   K 3.8 04/15/2017 0932   CL 103 04/15/2017 0932   CO2 19 (L) 04/15/2017 0932   GLUCOSE 96 04/15/2017 0932   GLUCOSE 138 (H) 03/10/2016 0919   BUN 22 04/15/2017 0932   CREATININE 0.96 04/15/2017 0932   CREATININE 0.86 01/31/2015 1129   CALCIUM 9.4 04/15/2017 0932   GFRNONAA 66 04/15/2017 0932   GFRAA 76 04/15/2017 0932  Lab Results  Component Value Date   HGBA1C 5.7 (H) 04/15/2017   HGBA1C 5.6 02/07/2017   HGBA1C 5.9 (H) 01/16/2016   HGBA1C 5.8 (H) 01/31/2015   HGBA1C 6.1 10/29/2014   Lab Results  Component Value Date   INSULIN 18.2 04/15/2017   CBC    Component Value Date/Time   WBC 7.2 04/15/2017 0932   WBC 12.7 (H) 03/10/2016 0909   RBC 4.79 04/15/2017 0932   RBC 4.80 03/10/2016 0909   HGB 14.2 04/15/2017 0932   HCT 41.8 04/15/2017 0932   PLT 223 02/07/2017 1157   MCV 87 04/15/2017 0932   MCH 29.6 04/15/2017 0932   MCH 29.2 03/10/2016 0909   MCHC 34.0 04/15/2017 0932   MCHC 34.6 03/10/2016 0909   RDW 13.9 04/15/2017 0932   LYMPHSABS 2.5 04/15/2017 0932   MONOABS 1.0 03/10/2016 0909   EOSABS 0.1 04/15/2017 0932   BASOSABS 0.0 04/15/2017 0932   Iron/TIBC/Ferritin/ %Sat    Component Value Date/Time   FERRITIN 16 06/29/2009   Lipid Panel     Component Value Date/Time   CHOL 153 04/15/2017 0932   TRIG 177 (H) 04/15/2017 0932   HDL 50 04/15/2017 0932   CHOLHDL 3.5 02/07/2017 1157   CHOLHDL 3.5 01/31/2015 1129   VLDL 31 (H) 01/31/2015 1129   LDLCALC 68  04/15/2017 0932   Hepatic Function Panel     Component Value Date/Time   PROT 6.8 04/15/2017 0932   ALBUMIN 4.2 04/15/2017 0932   AST 23 04/15/2017 0932   ALT 35 (H) 04/15/2017 0932   ALKPHOS 84 04/15/2017 0932   BILITOT 0.4 04/15/2017 0932   BILIDIR 0.2 03/10/2016 0909   IBILI 0.7 03/10/2016 0909      Component Value Date/Time   TSH 2.430 04/15/2017 0932   TSH 2.710 02/07/2017 1157   TSH 3.310 01/16/2016 1141   Results for ASHYA, NICOLAISEN "KIM" (MRN [id number]) as of 08/05/2017 12:03  Ref. Range 04/15/2017 09:32  Vitamin D, 25-Hydroxy Latest Ref Range: 30.0 - 100.0 ng/mL 31.1   ASSESSMENT AND PLAN: Vitamin D deficiency - Plan: VITAMIN D 25 Hydroxy (Vit-D Deficiency, Fractures), Vitamin D, Ergocalciferol, (DRISDOL) 50000 units CAPS capsule  Mixed hyperlipidemia - Plan: Lipid Panel With LDL/HDL Ratio  Prediabetes - Plan: Comprehensive metabolic panel, Hemoglobin A1c, Insulin, random  Other depression - with emotional eating - Plan: buPROPion (WELLBUTRIN SR) 150 MG 12 hr tablet  At risk for diabetes mellitus  Class 3 severe obesity with serious comorbidity and body mass index (BMI) of 40.0 to 44.9 in adult, unspecified obesity type (HCC)  PLAN:  Vitamin D Deficiency Catherine Trevino was informed that low vitamin D levels contributes to fatigue and are associated with obesity, breast, and colon cancer. She agrees to continue to take prescription Vit D @50 ,000 IU every week #4 with no refills and will follow up for routine testing of vitamin D, at least 2-3 times per year. She was informed of the risk of over-replacement of vitamin D and agrees to not increase her dose unless she discusses this with [place] first. Catherine Trevino agrees to follow up as directed.  Hyperlipidemia (new) Catherine Trevino was informed of the American Heart Association Guidelines emphasizing intensive lifestyle modifications as the first line treatment for hyperlipidemia. We discussed many lifestyle modifications today in depth,  and Catherine Trevino will continue to work on decreasing saturated fats such as fatty red meat, butter and many fried foods. She will also increase vegetables and lean protein in her diet and continue to work on exercise  and weight loss efforts. We will check labs and Catherine Trevino will follow up as directed.  Pre-Diabetes Catherine Trevino will continue to work on weight loss, exercise, and decreasing simple carbohydrates in her diet to help decrease the risk of diabetes. She was informed that eating too many simple carbohydrates or too many calories at one sitting increases the likelihood of GI side effects. We will check labs and Catherine Trevino agreed to follow up with [place] as directed to monitor her progress.  Diabetes risk counseling Catherine Trevino was given extended (15 minutes) diabetes prevention counseling today. She is 57 y.o. female and has risk factors for diabetes including obesity and pre-diabetes. We discussed intensive lifestyle modifications today with an emphasis on weight loss as well as increasing exercise and decreasing simple carbohydrates in her diet.  Depression with Emotional Eating Behaviors We discussed behavior modification techniques today to help Catherine Trevino deal with her emotional eating and depression. She has agreed to start Wellbutrin SR 150 mg qd #30 with no refills and continue Topamax. Catherine Trevino agreed to follow up as directed.  Obesity Catherine Trevino is currently in the action stage of change. As such, her goal is to continue with weight loss efforts She has agreed to follow the Category 3 plan Zelia has been instructed to work up to a goal of 150 minutes of combined cardio and strengthening exercise per week for weight loss and overall health benefits. We discussed the following Behavioral Modification Strategies today: decrease eating out and emotional eating strategies  Abiageal has agreed to follow up with our clinic in 2 weeks. She was informed of the importance of frequent follow up visits to  maximize her success with intensive lifestyle modifications for her multiple health conditions.   OBESITY BEHAVIORAL INTERVENTION VISIT  Today's visit was # 7 out of 22.  Starting weight: 238 lbs Starting date: 04/15/17 Today's weight : 236 lbs Today's date: 08/05/2017 Total lbs lost to date: 2    ASK: We discussed the diagnosis of obesity with Donata Duff today and Laurabelle agreed to give [place] permission to discuss obesity behavioral modification therapy today.  ASSESS: Joeli has the diagnosis of obesity and her BMI today is 40.49 Ranie is in the action stage of change   ADVISE: Domitila was educated on the multiple health risks of obesity as well as the benefit of weight loss to improve her health. She was advised of the need for long term treatment and the importance of lifestyle modifications.  AGREE: Multiple dietary modification options and treatment options were discussed and  Dalasia agreed to the above obesity treatment plan.  I, Doreene Nest, am acting as transcriptionist for Dennard Nip, MD  I have reviewed the above documentation for accuracy and completeness, and I agree with the above. -Dennard Nip, MD

## 2017-08-06 LAB — LIPID PANEL WITH LDL/HDL RATIO
CHOLESTEROL TOTAL: 144 mg/dL (ref 100–199)
HDL: 51 mg/dL (ref >39)
LDL Calculated: 65 mg/dL (ref 0–99)
LDL/HDL RATIO: 1.3 ratio (ref 0.0–3.2)
TRIGLYCERIDES: 140 mg/dL (ref 0–149)
VLDL Cholesterol Cal: 28 mg/dL (ref 5–40)

## 2017-08-06 LAB — COMPREHENSIVE METABOLIC PANEL
ALT: 24 IU/L (ref 0–32)
AST: 17 IU/L (ref 0–40)
Albumin/Globulin Ratio: 1.6 (ref 1.2–2.2)
Albumin: 4.1 g/dL (ref 3.5–5.5)
Alkaline Phosphatase: 76 IU/L (ref 39–117)
BILIRUBIN TOTAL: 0.3 mg/dL (ref 0.0–1.2)
BUN / CREAT RATIO: 18 (ref 9–23)
BUN: 17 mg/dL (ref 6–24)
CO2: 21 mmol/L (ref 20–29)
CREATININE: 0.93 mg/dL (ref 0.57–1.00)
Calcium: 9.2 mg/dL (ref 8.7–10.2)
Chloride: 106 mmol/L (ref 96–106)
GFR calc non Af Amer: 69 mL/min/{1.73_m2} (ref >59)
GFR, EST AFRICAN AMERICAN: 79 mL/min/{1.73_m2} (ref >59)
GLUCOSE: 116 mg/dL — AB (ref 65–99)
Globulin, Total: 2.5 g/dL (ref 1.5–4.5)
Potassium: 4.1 mmol/L (ref 3.5–5.2)
Sodium: 143 mmol/L (ref 134–144)
TOTAL PROTEIN: 6.6 g/dL (ref 6.0–8.5)

## 2017-08-06 LAB — INSULIN, RANDOM: INSULIN: 19.3 u[IU]/mL (ref 2.6–24.9)

## 2017-08-06 LAB — HEMOGLOBIN A1C
ESTIMATED AVERAGE GLUCOSE: 117 mg/dL
HEMOGLOBIN A1C: 5.7 % — AB (ref 4.8–5.6)

## 2017-08-06 LAB — VITAMIN D 25 HYDROXY (VIT D DEFICIENCY, FRACTURES): Vit D, 25-Hydroxy: 33.9 ng/mL (ref 30.0–100.0)

## 2017-08-19 ENCOUNTER — Ambulatory Visit (INDEPENDENT_AMBULATORY_CARE_PROVIDER_SITE_OTHER): Payer: BC Managed Care – PPO | Admitting: Family Medicine

## 2017-08-19 VITALS — BP 100/67 | HR 81 | Temp 98.1°F | Ht 64.0 in | Wt 235.0 lb

## 2017-08-19 DIAGNOSIS — R7303 Prediabetes: Secondary | ICD-10-CM

## 2017-08-19 DIAGNOSIS — E559 Vitamin D deficiency, unspecified: Secondary | ICD-10-CM

## 2017-08-19 DIAGNOSIS — Z6841 Body Mass Index (BMI) 40.0 and over, adult: Secondary | ICD-10-CM

## 2017-08-19 DIAGNOSIS — F3289 Other specified depressive episodes: Secondary | ICD-10-CM

## 2017-08-19 DIAGNOSIS — Z9189 Other specified personal risk factors, not elsewhere classified: Secondary | ICD-10-CM | POA: Diagnosis not present

## 2017-08-19 MED ORDER — BUPROPION HCL ER (SR) 150 MG PO TB12: 150.0000 mg | ORAL_TABLET | Freq: Every day | ORAL | 0 refills | Status: DC

## 2017-08-19 MED ORDER — METFORMIN HCL 500 MG PO TABS: 500.0000 mg | ORAL_TABLET | Freq: Every day | ORAL | 0 refills | Status: DC

## 2017-08-19 MED ORDER — VITAMIN D (ERGOCALCIFEROL) 1.25 MG (50000 UNIT) PO CAPS: 50000.0000 [IU] | ORAL_CAPSULE | ORAL | 0 refills | Status: DC

## 2017-08-20 NOTE — Progress Notes (Signed)
Office: [phone]  /  Fax: [phone]   HPI:   Chief Complaint: OBESITY Catherine Trevino is here to discuss her progress with her obesity treatment plan. She is on the Category 3 plan and is following her eating plan approximately 90 % of the time. She states she is walking 30 minutes 3 times per week. Ta did well with weight loss. She reports following the meal plan closely and is able to get all the food in during the day. She notes a decrease in emotional eating. Her weight is 235 lb (106.6 kg) today and has had a weight loss of 1 pound over a period of 2 weeks since her last visit. She has lost 3 lbs since starting treatment with [place].  Vitamin D deficiency Catherine Trevino has a diagnosis of vitamin D deficiency. She is currently taking vit D and last level was not at goal. She denies nausea, vomiting or muscle weakness.  Pre-Diabetes Catherine Trevino has a diagnosis of prediabetes based on her elevated Hgb A1c and was informed this puts her at greater risk of developing diabetes. She is not taking metformin currently and continues to work on diet and exercise to decrease risk of diabetes. She denies polyphagia or hypoglycemia.  At risk for diabetes Catherine Trevino is at higher than average risk for developing diabetes due to her obesity and pre-diabetes. She currently denies polyuria or polydipsia.  Depression with emotional eating behaviors Catherine Trevino is doing well with Bupropion. She notes a decrease in emotional eating. Catherine Trevino struggles with emotional eating and using food for comfort to the extent that it is negatively impacting her health. She often snacks when she is not hungry. Catherine Trevino sometimes feels she is out of control and then feels guilty that she made poor food choices. She has been working on behavior modification techniques to help reduce her emotional eating and has been somewhat successful. Blood pressure is controlled. She shows no sign of suicidal or homicidal ideations.  Depression  screen [facility] 2/9 04/15/2017 02/07/2017 01/16/2016 10/25/2014  Decreased Interest 1 0 0 0  Down, Depressed, Hopeless 1 0 0 0  PHQ - 2 Score 2 0 0 0  Altered sleeping 1 - - -  Tired, decreased energy 1 - - -  Change in appetite 2 - - -  Feeling bad or failure about yourself  1 - - -  Trouble concentrating 1 - - -  Moving slowly or fidgety/restless 1 - - -  Suicidal thoughts 0 - - -  PHQ-9 Score 9 - - -  Difficult doing work/chores Not difficult at all - - -     ALLERGIES: No Known Allergies  MEDICATIONS: Current Outpatient Medications on File Prior to Visit  Medication Sig Dispense Refill  . lisinopril-hydrochlorothiazide (PRINZIDE,ZESTORETIC) 10-12.5 MG tablet TAKE 1 TABLET DAILY. PLEASEMAKE AN APPOINTMENT WITH   YOUR DOCTOR 30 tablet 0  . Multiple Vitamins-Minerals (MULTIVITAMIN WOMEN 50+ PO) Take 1 tablet by mouth daily.    Marland Kitchen omega-3 acid ethyl esters (LOVAZA) 1 g capsule Take by mouth daily.    Marland Kitchen OVER THE COUNTER MEDICATION Take 1 capsule by mouth daily. Tumeric/Curcumin 500 mg taking     . polyethylene glycol powder (GLYCOLAX/MIRALAX) powder Take 17 g by mouth daily. Take every 12 hours 3350 g 0  . topiramate (TOPAMAX) 100 MG tablet Take 1 tablet (100 mg total) by mouth daily. 90 tablet 3   No current facility-administered medications on file prior to visit.     PAST MEDICAL HISTORY: Past Medical History:  Diagnosis Date  . Anemia   . ANGIODYSPLASIA, INTESTINE, WITHOUT HEMORRHAGE 08/16/2009   Qualifier: Diagnosis of  By: Carlean Purl MD, Dimas Millin Diabetes mellitus without complication ([place])    pt stated prediabetes, not on meds  . Diverticulitis   . GERD (gastroesophageal reflux disease)   . Hx of adenomatous colonic polyps 12/29/2014  . Hypertension   . Obesity   . OSA (obstructive sleep apnea) 01/06/2016   Mild. CPAP discontinued 12/2016 following significant weight loss.   . OSA on CPAP 01/06/2016    PAST SURGICAL HISTORY: Past Surgical History:  Procedure  Laterality Date  . ABDOMINAL HYSTERECTOMY    . COLONOSCOPY    . TUBAL LIGATION      SOCIAL HISTORY: Social History   Tobacco Use  . Smoking status: Never Smoker  . Smokeless tobacco: Never Used  Substance Use Topics  . Alcohol use: No    Alcohol/week: 0.0 oz  . Drug use: No    FAMILY HISTORY: Family History  Problem Relation Age of Onset  . Hypertension Father   . Emphysema Maternal Grandmother   . Cancer Maternal Grandfather   . Hypertension Paternal Grandmother   . Cancer Paternal Grandmother   . Diabetes Paternal Grandmother   . Hyperlipidemia Paternal Grandmother   . Hypertension Paternal Grandfather   . Diabetes Paternal Grandfather   . Hyperlipidemia Paternal Grandfather   . Colon cancer Neg Hx     ROS: Review of Systems  Constitutional: Positive for weight loss.  Gastrointestinal: Negative for nausea and vomiting.  Genitourinary: Negative for frequency.  Musculoskeletal:       Negative for muscle weakness  Endo/Heme/Allergies: Negative for polydipsia.       Negative for hypoglycemia Negative for polyphagia  Psychiatric/Behavioral: Positive for depression. Negative for suicidal ideas.    PHYSICAL EXAM: Blood pressure 100/67, pulse 81, temperature 98.1 F (36.7 C), temperature source Oral, height 5\' 4"  (1.626 m), weight 235 lb (106.6 kg), SpO2 97 %. Body mass index is 40.34 kg/m. Physical Exam  Constitutional: She is oriented to person, place, and time. She appears well-developed and well-nourished.  Cardiovascular: Normal rate.  Pulmonary/Chest: Effort normal.  Musculoskeletal: Normal range of motion.  Neurological: She is oriented to person, place, and time.  Skin: Skin is warm and dry.  Psychiatric: She has a normal mood and affect. Her behavior is normal.  Vitals reviewed.   RECENT LABS AND TESTS: BMET    Component Value Date/Time   NA 143 08/05/2017 0804   K 4.1 08/05/2017 0804   CL 106 08/05/2017 0804   CO2 21 08/05/2017 0804   GLUCOSE  116 (H) 08/05/2017 0804   GLUCOSE 138 (H) 03/10/2016 0919   BUN 17 08/05/2017 0804   CREATININE 0.93 08/05/2017 0804   CREATININE 0.86 01/31/2015 1129   CALCIUM 9.2 08/05/2017 0804   GFRNONAA 69 08/05/2017 0804   GFRAA 79 08/05/2017 0804   Lab Results  Component Value Date   HGBA1C 5.7 (H) 08/05/2017   HGBA1C 5.7 (H) 04/15/2017   HGBA1C 5.6 02/07/2017   HGBA1C 5.9 (H) 01/16/2016   HGBA1C 5.8 (H) 01/31/2015   Lab Results  Component Value Date   INSULIN 19.3 08/05/2017   INSULIN 18.2 04/15/2017   CBC    Component Value Date/Time   WBC 7.2 04/15/2017 0932   WBC 12.7 (H) 03/10/2016 0909   RBC 4.79 04/15/2017 0932   RBC 4.80 03/10/2016 0909   HGB 14.2 04/15/2017 0932   HCT 41.8 04/15/2017 0932  PLT 223 02/07/2017 1157   MCV 87 04/15/2017 0932   MCH 29.6 04/15/2017 0932   MCH 29.2 03/10/2016 0909   MCHC 34.0 04/15/2017 0932   MCHC 34.6 03/10/2016 0909   RDW 13.9 04/15/2017 0932   LYMPHSABS 2.5 04/15/2017 0932   MONOABS 1.0 03/10/2016 0909   EOSABS 0.1 04/15/2017 0932   BASOSABS 0.0 04/15/2017 0932   Iron/TIBC/Ferritin/ %Sat    Component Value Date/Time   FERRITIN 16 06/29/2009   Lipid Panel     Component Value Date/Time   CHOL 144 08/05/2017 0804   TRIG 140 08/05/2017 0804   HDL 51 08/05/2017 0804   CHOLHDL 3.5 02/07/2017 1157   CHOLHDL 3.5 01/31/2015 1129   VLDL 31 (H) 01/31/2015 1129   LDLCALC 65 08/05/2017 0804   Hepatic Function Panel     Component Value Date/Time   PROT 6.6 08/05/2017 0804   ALBUMIN 4.1 08/05/2017 0804   AST 17 08/05/2017 0804   ALT 24 08/05/2017 0804   ALKPHOS 76 08/05/2017 0804   BILITOT 0.3 08/05/2017 0804   BILIDIR 0.2 03/10/2016 0909   IBILI 0.7 03/10/2016 0909      Component Value Date/Time   TSH 2.430 04/15/2017 0932   TSH 2.710 02/07/2017 1157   TSH 3.310 01/16/2016 1141   Results for ARILYN, BRIERLEY "KIM" (MRN [id number]) as of 08/20/2017 10:08  Ref. Range 08/05/2017 08:04  Vitamin D, 25-Hydroxy Latest Ref Range:  30.0 - 100.0 ng/mL 33.9   ASSESSMENT AND PLAN: Vitamin D deficiency - Plan: Vitamin D, Ergocalciferol, (DRISDOL) 50000 units CAPS capsule  Prediabetes - Plan: metFORMIN (GLUCOPHAGE) 500 MG tablet  Other depression - with emotional eating - Plan: buPROPion (WELLBUTRIN SR) 150 MG 12 hr tablet  At risk for diabetes mellitus  Class 3 severe obesity with serious comorbidity and body mass index (BMI) of 40.0 to 44.9 in adult, unspecified obesity type ([place])  PLAN:  Vitamin D Deficiency Skylan was informed that low vitamin D levels contributes to fatigue and are associated with obesity, breast, and colon cancer. She agrees to continue to take prescription Vit D @50 ,000 IU every week #4 with no refills and will follow up for routine testing of vitamin D, at least 2-3 times per year. She was informed of the risk of over-replacement of vitamin D and agrees to not increase her dose unless she discusses this with [place] first. Damon agrees to follow up as directed.  Pre-Diabetes Gazelle will continue to work on weight loss, exercise, and decreasing simple carbohydrates in her diet to help decrease the risk of diabetes. We dicussed metformin including benefits and risks. She was informed that eating too many simple carbohydrates or too many calories at one sitting increases the likelihood of GI side effects. Gracielynn has agreed to start metformin 500 mg qAM #30 with no refills and follow up with [place] as directed to monitor her progress.  Diabetes risk counseling Kathlen was given extended (15 minutes) diabetes prevention counseling today. She is 57 y.o. female and has risk factors for diabetes including obesity and pre-diabetes. We discussed intensive lifestyle modifications today with an emphasis on weight loss as well as increasing exercise and decreasing simple carbohydrates in her diet.  Depression with Emotional Eating Behaviors We discussed behavior modification techniques today to help Lillyth  deal with her emotional eating and depression. She has agreed to continue Bupropion 150 mg qd #30 with no refills and follow up as directed.  Obesity Kalsey is currently in the action stage of change. As  such, her goal is to continue with weight loss efforts She has agreed to follow the Category 3 plan Sharda has been instructed to work up to a goal of 150 minutes of combined cardio and strengthening exercise per week for weight loss and overall health benefits. We discussed the following Behavioral Modification Strategies today: increase H2O intake and work on meal planning and easy cooking plans  Barbarann has agreed to follow up with our clinic in 2 to 3 weeks. She was informed of the importance of frequent follow up visits to maximize her success with intensive lifestyle modifications for her multiple health conditions.   OBESITY BEHAVIORAL INTERVENTION VISIT  Today's visit was # 8 out of 22.  Starting weight: 238 lbs Starting date: 04/15/17 Today's weight : 235 lbs Today's date: 08/19/2017 Total lbs lost to date: 3    ASK: We discussed the diagnosis of obesity with Donata Duff today and Eden agreed to give [place] permission to discuss obesity behavioral modification therapy today.  ASSESS: Thanh has the diagnosis of obesity and her BMI today is 40.32 Harshini is in the action stage of change   ADVISE: Elaiza was educated on the multiple health risks of obesity as well as the benefit of weight loss to improve her health. She was advised of the need for long term treatment and the importance of lifestyle modifications.  AGREE: Multiple dietary modification options and treatment options were discussed and  Sariya agreed to the above obesity treatment plan.  I, Doreene Nest, am acting as transcriptionist for Dennard Nip, MD  I have reviewed the above documentation for accuracy and completeness, and I agree with the above. -Dennard Nip, MD

## 2017-09-02 ENCOUNTER — Ambulatory Visit (INDEPENDENT_AMBULATORY_CARE_PROVIDER_SITE_OTHER): Payer: BC Managed Care – PPO | Admitting: Physician Assistant

## 2017-09-02 VITALS — BP 124/78 | HR 86 | Temp 97.8°F | Ht 64.0 in | Wt 234.0 lb

## 2017-09-02 DIAGNOSIS — R7303 Prediabetes: Secondary | ICD-10-CM | POA: Diagnosis not present

## 2017-09-02 DIAGNOSIS — F3289 Other specified depressive episodes: Secondary | ICD-10-CM | POA: Diagnosis not present

## 2017-09-02 DIAGNOSIS — Z6841 Body Mass Index (BMI) 40.0 and over, adult: Secondary | ICD-10-CM

## 2017-09-02 DIAGNOSIS — Z9189 Other specified personal risk factors, not elsewhere classified: Secondary | ICD-10-CM | POA: Diagnosis not present

## 2017-09-02 MED ORDER — BUPROPION HCL ER (SR) 150 MG PO TB12: 150.0000 mg | ORAL_TABLET | Freq: Every day | ORAL | 0 refills | Status: DC

## 2017-09-02 NOTE — Progress Notes (Signed)
Office: [phone]  /  Fax: [phone]   HPI:   Chief Complaint: OBESITY Catherine Trevino is here to discuss Catherine Trevino progress with Catherine Trevino obesity treatment plan. Catherine Trevino is on the Category 3 plan and is following Catherine Trevino eating plan approximately 90 % of the time. Catherine Trevino states Catherine Trevino is walking 30 minutes 3 times per week. Catherine Trevino did well with weight loss. Catherine Trevino reports that Catherine Trevino has had a decrease in cravings and that following the plan has been easier. Catherine Trevino notes a little more stress recently with the start of school. Catherine Trevino weight is 234 lb (106.1 kg) today and has had a weight loss of 1 pound over a period of 2 weeks since Catherine Trevino last visit. Catherine Trevino has lost 4 lbs since starting treatment with [place].  Pre-Diabetes Catherine Trevino has a diagnosis of prediabetes based on Catherine Trevino elevated Hgb A1c and was informed this puts Catherine Trevino at greater risk of developing diabetes. Catherine Trevino is taking metformin currently and continues to work on diet and exercise to decrease risk of diabetes. Catherine Trevino denies polyphagia or hypoglycemia.  At risk for diabetes Catherine Trevino is at higher than average risk for developing diabetes due to Catherine Trevino obesity and prediabetes Catherine Trevino currently denies polyuria or polydipsia.  Depression with emotional eating behaviors Catherine Trevino is struggling with emotional eating and using food for comfort to the extent that it is negatively impacting Catherine Trevino health. Catherine Trevino often snacks when Catherine Trevino is not hungry. Catherine Trevino sometimes feels Catherine Trevino is out of control and then feels guilty that Catherine Trevino made poor food choices. Catherine Trevino has been working on behavior modification techniques to help reduce Catherine Trevino emotional eating and has been somewhat successful. Catherine Trevino notes a decrease in cravings. Catherine Trevino blood pressure is normal and Catherine Trevino shows no sign of suicidal or homicidal ideations.  Depression screen Catherine Trevino 2/9 04/15/2017 02/07/2017 01/16/2016 10/25/2014  Decreased Interest 1 0 0 0  Down, Depressed, Hopeless 1 0 0 0  PHQ - 2 Score 2 0 0 0  Altered sleeping 1 - - -  Tired, decreased energy 1 - - -    Change in appetite 2 - - -  Feeling bad or failure about yourself  1 - - -  Trouble concentrating 1 - - -  Moving slowly or fidgety/restless 1 - - -  Suicidal thoughts 0 - - -  PHQ-9 Score 9 - - -  Difficult doing work/chores Not difficult at all - - -     ALLERGIES: No Known Allergies  MEDICATIONS: Current Outpatient Medications on File Prior to Visit  Medication Sig Dispense Refill  . buPROPion (WELLBUTRIN SR) 150 MG 12 hr tablet Take 1 tablet (150 mg total) by mouth daily. 30 tablet 0  . lisinopril-hydrochlorothiazide (PRINZIDE,ZESTORETIC) 10-12.5 MG tablet TAKE 1 TABLET DAILY. PLEASEMAKE AN APPOINTMENT WITH   YOUR DOCTOR 30 tablet 0  . metFORMIN (GLUCOPHAGE) 500 MG tablet Take 1 tablet (500 mg total) by mouth daily with breakfast. 30 tablet 0  . Multiple Vitamins-Minerals (MULTIVITAMIN WOMEN 50+ PO) Take 1 tablet by mouth daily.    Marland Kitchen omega-3 acid ethyl esters (LOVAZA) 1 g capsule Take by mouth daily.    Marland Kitchen OVER THE COUNTER MEDICATION Take 1 capsule by mouth daily. Tumeric/Curcumin 500 mg taking     . polyethylene glycol powder (GLYCOLAX/MIRALAX) powder Take 17 g by mouth daily. Take every 12 hours 3350 g 0  . topiramate (TOPAMAX) 100 MG tablet Take 1 tablet (100 mg total) by mouth daily. 90 tablet 3  . Vitamin D, Ergocalciferol, (DRISDOL) 50000 units CAPS capsule Take  1 capsule (50,000 Units total) by mouth every 7 (seven) days. 4 capsule 0   No current facility-administered medications on file prior to visit.     PAST MEDICAL HISTORY: Past Medical History:  Diagnosis Date  . Anemia   . ANGIODYSPLASIA, INTESTINE, WITHOUT HEMORRHAGE 08/16/2009   Qualifier: Diagnosis of  By: Carlean Purl MD, Dimas Millin Diabetes mellitus without complication ([place])    pt stated prediabetes, not on meds  . Diverticulitis   . GERD (gastroesophageal reflux disease)   . Hx of adenomatous colonic polyps 12/29/2014  . Hypertension   . Obesity   . OSA (obstructive sleep apnea) 01/06/2016   Mild.  CPAP discontinued 12/2016 following significant weight loss.   . OSA on CPAP 01/06/2016    PAST SURGICAL HISTORY: Past Surgical History:  Procedure Laterality Date  . ABDOMINAL HYSTERECTOMY    . COLONOSCOPY    . TUBAL LIGATION      SOCIAL HISTORY: Social History   Tobacco Use  . Smoking status: Never Smoker  . Smokeless tobacco: Never Used  Substance Use Topics  . Alcohol use: No    Alcohol/week: 0.0 standard drinks  . Drug use: No    FAMILY HISTORY: Family History  Problem Relation Age of Onset  . Hypertension Father   . Emphysema Maternal Grandmother   . Cancer Maternal Grandfather   . Hypertension Paternal Grandmother   . Cancer Paternal Grandmother   . Diabetes Paternal Grandmother   . Hyperlipidemia Paternal Grandmother   . Hypertension Paternal Grandfather   . Diabetes Paternal Grandfather   . Hyperlipidemia Paternal Grandfather   . Colon cancer Neg Hx     ROS: Review of Systems  Constitutional: Positive for weight loss.  Genitourinary: Negative for frequency.  Endo/Heme/Allergies: Negative for polydipsia.       Negative for polyphagia Negative for hypoglycemia Positive for cravings  Psychiatric/Behavioral: Positive for depression. Negative for suicidal ideas.    PHYSICAL EXAM: Blood pressure 124/78, pulse 86, temperature 97.8 F (36.6 C), temperature source Oral, height 5\' 4"  (1.626 m), weight 234 lb (106.1 kg), SpO2 96 %. Body mass index is 40.17 kg/m. Physical Exam  Constitutional: Catherine Trevino is oriented to person, place, and time. Catherine Trevino appears well-developed and well-nourished.  Cardiovascular: Normal rate.  Pulmonary/Chest: Effort normal.  Musculoskeletal: Normal range of motion.  Neurological: Catherine Trevino is oriented to person, place, and time.  Skin: Skin is warm and dry.  Psychiatric: Catherine Trevino has a normal mood and affect. Catherine Trevino behavior is normal.  Vitals reviewed.   RECENT LABS AND TESTS: BMET    Component Value Date/Time   NA 143 08/05/2017 0804   K  4.1 08/05/2017 0804   CL 106 08/05/2017 0804   CO2 21 08/05/2017 0804   GLUCOSE 116 (H) 08/05/2017 0804   GLUCOSE 138 (H) 03/10/2016 0919   BUN 17 08/05/2017 0804   CREATININE 0.93 08/05/2017 0804   CREATININE 0.86 01/31/2015 1129   CALCIUM 9.2 08/05/2017 0804   GFRNONAA 69 08/05/2017 0804   GFRAA 79 08/05/2017 0804   Lab Results  Component Value Date   HGBA1C 5.7 (H) 08/05/2017   HGBA1C 5.7 (H) 04/15/2017   HGBA1C 5.6 02/07/2017   HGBA1C 5.9 (H) 01/16/2016   HGBA1C 5.8 (H) 01/31/2015   Lab Results  Component Value Date   INSULIN 19.3 08/05/2017   INSULIN 18.2 04/15/2017   CBC    Component Value Date/Time   WBC 7.2 04/15/2017 0932   WBC 12.7 (H) 03/10/2016 0909   RBC 4.79  04/15/2017 0932   RBC 4.80 03/10/2016 0909   HGB 14.2 04/15/2017 0932   HCT 41.8 04/15/2017 0932   PLT 223 02/07/2017 1157   MCV 87 04/15/2017 0932   MCH 29.6 04/15/2017 0932   MCH 29.2 03/10/2016 0909   MCHC 34.0 04/15/2017 0932   MCHC 34.6 03/10/2016 0909   RDW 13.9 04/15/2017 0932   LYMPHSABS 2.5 04/15/2017 0932   MONOABS 1.0 03/10/2016 0909   EOSABS 0.1 04/15/2017 0932   BASOSABS 0.0 04/15/2017 0932   Iron/TIBC/Ferritin/ %Sat    Component Value Date/Time   FERRITIN 16 06/29/2009   Lipid Panel     Component Value Date/Time   CHOL 144 08/05/2017 0804   TRIG 140 08/05/2017 0804   HDL 51 08/05/2017 0804   CHOLHDL 3.5 02/07/2017 1157   CHOLHDL 3.5 01/31/2015 1129   VLDL 31 (H) 01/31/2015 1129   LDLCALC 65 08/05/2017 0804   Hepatic Function Panel     Component Value Date/Time   PROT 6.6 08/05/2017 0804   ALBUMIN 4.1 08/05/2017 0804   AST 17 08/05/2017 0804   ALT 24 08/05/2017 0804   ALKPHOS 76 08/05/2017 0804   BILITOT 0.3 08/05/2017 0804   BILIDIR 0.2 03/10/2016 0909   IBILI 0.7 03/10/2016 0909      Component Value Date/Time   TSH 2.430 04/15/2017 0932   TSH 2.710 02/07/2017 1157   TSH 3.310 01/16/2016 1141   Results for BUFORD, GAYLER "KIM" (MRN [id number]) as of  09/02/2017 15:01  Ref. Range 08/05/2017 08:04  Vitamin D, 25-Hydroxy Latest Ref Range: 30.0 - 100.0 ng/mL 33.9   ASSESSMENT AND PLAN: Prediabetes  Other depression - with emotional eating - Plan: buPROPion (WELLBUTRIN SR) 150 MG 12 hr tablet  At risk for diabetes mellitus  Class 3 severe obesity with serious comorbidity and body mass index (BMI) of 40.0 to 44.9 in adult, unspecified obesity type ([place])  PLAN:  Pre-Diabetes Catherine Trevino will continue to work on weight loss, exercise, and decreasing simple carbohydrates in Catherine Trevino diet to help decrease the risk of diabetes. We dicussed metformin including benefits and risks. Catherine Trevino was informed that eating too many simple carbohydrates or too many calories at one sitting increases the likelihood of GI side effects. Catherine Trevino will continue with  metformin for now and a prescription was not written today. Catherine Trevino agreed to follow up with [place] as directed to monitor Catherine Trevino progress.  Diabetes risk counseling Catherine Trevino was given extended (15 minutes) diabetes prevention counseling today. Catherine Trevino is 57 y.o. female and has risk factors for diabetes including obesity and prediabetes. We discussed intensive lifestyle modifications today with an emphasis on weight loss as well as increasing exercise and decreasing simple carbohydrates in Catherine Trevino diet.  Depression with Emotional Eating Behaviors We discussed behavior modification techniques today to help Catherine Trevino deal with Catherine Trevino emotional eating and depression. Catherine Trevino has agreed to continue Wellbutrin SR 150 mg qd #30 with no refills and follow up as directed.  Obesity Catherine Trevino is currently in the action stage of change. As such, Catherine Trevino goal is to continue with weight loss efforts Catherine Trevino has agreed to follow the Category 3 plan Catherine Trevino has been instructed to work up to a goal of 150 minutes of combined cardio and strengthening exercise per week for weight loss and overall health benefits. We discussed the following Behavioral  Modification Strategies today: work on meal planning and easy cooking plans and ways to avoid boredom eating  Catherine Trevino has agreed to follow up with our clinic in 2 to 3  weeks. Catherine Trevino was informed of the importance of frequent follow up visits to maximize Catherine Trevino success with intensive lifestyle modifications for Catherine Trevino multiple health conditions.   OBESITY BEHAVIORAL INTERVENTION VISIT  Today's visit was # 9 out of 22.  Starting weight: 238 lbs Starting date: 04/15/17 Today's weight : 234 lbs  Today's date: 09/02/2017 Total lbs lost to date: 4    ASK: We discussed the diagnosis of obesity with Catherine Trevino today and Catherine Trevino agreed to give [place] permission to discuss obesity behavioral modification therapy today.  ASSESS: Catherine Trevino has the diagnosis of obesity and Catherine Trevino BMI today is 40.15 Catherine Trevino is in the action stage of change   ADVISE: Catherine Trevino was educated on the multiple health risks of obesity as well as the benefit of weight loss to improve Catherine Trevino health. Catherine Trevino was advised of the need for long term treatment and the importance of lifestyle modifications.  AGREE: Multiple dietary modification options and treatment options were discussed and  Catherine Trevino agreed to the above obesity treatment plan.  Corey Skains, am acting as transcriptionist for Abby Potash, PA-C I, Abby Potash, PA-C have reviewed above note and agree with its content

## 2017-09-17 ENCOUNTER — Ambulatory Visit (INDEPENDENT_AMBULATORY_CARE_PROVIDER_SITE_OTHER): Payer: BC Managed Care – PPO | Admitting: Physician Assistant

## 2017-09-17 VITALS — BP 118/76 | HR 74 | Temp 97.5°F | Ht 64.0 in | Wt 235.0 lb

## 2017-09-17 DIAGNOSIS — R7303 Prediabetes: Secondary | ICD-10-CM | POA: Diagnosis not present

## 2017-09-17 DIAGNOSIS — Z9189 Other specified personal risk factors, not elsewhere classified: Secondary | ICD-10-CM | POA: Diagnosis not present

## 2017-09-17 DIAGNOSIS — Z6841 Body Mass Index (BMI) 40.0 and over, adult: Secondary | ICD-10-CM

## 2017-09-17 DIAGNOSIS — E559 Vitamin D deficiency, unspecified: Secondary | ICD-10-CM | POA: Diagnosis not present

## 2017-09-17 MED ORDER — METFORMIN HCL 500 MG PO TABS: 500.0000 mg | ORAL_TABLET | Freq: Every day | ORAL | 0 refills | Status: DC

## 2017-09-17 MED ORDER — VITAMIN D (ERGOCALCIFEROL) 1.25 MG (50000 UNIT) PO CAPS: 50000.0000 [IU] | ORAL_CAPSULE | ORAL | 0 refills | Status: DC

## 2017-09-18 NOTE — Progress Notes (Signed)
Office: [phone]  /  Fax: [phone]   HPI:   Chief Complaint: OBESITY Catherine Trevino is here to discuss her progress with her obesity treatment plan. She is on the Category 3 plan and is following her eating plan approximately 90 % of the time. She states she is walking for 30 minutes 3 times per week. Catherine Trevino reports that she has been following the plan closely and she is frustrated with lack of weight loss. She is going to the beach for vacation in 2 weeks.  Her weight is 235 lb (106.6 kg) today and has gained 1 pound since her last visit. She has lost 3 lbs since starting treatment with [place].  Pre-Diabetes Catherine Trevino has a diagnosis of pre-diabetes based on her elevated Hgb A1c and was informed this puts her at greater risk of developing diabetes. She is taking metformin currently and continues to work on diet and exercise to decrease risk of diabetes. She denies polyphagia or hypoglycemia.  Vitamin D Deficiency Catherine Trevino has a diagnosis of vitamin D deficiency. She is on prescription Vit D and denies nausea, vomiting or muscle weakness.  At risk for osteopenia and osteoporosis Catherine Trevino is at higher risk of osteopenia and osteoporosis due to vitamin D deficiency.   ALLERGIES: No Known Allergies  MEDICATIONS: Current Outpatient Medications on File Prior to Visit  Medication Sig Dispense Refill  . buPROPion (WELLBUTRIN SR) 150 MG 12 hr tablet Take 1 tablet (150 mg total) by mouth daily. 30 tablet 0  . lisinopril-hydrochlorothiazide (PRINZIDE,ZESTORETIC) 10-12.5 MG tablet TAKE 1 TABLET DAILY. PLEASEMAKE AN APPOINTMENT WITH   YOUR DOCTOR 30 tablet 0  . Multiple Vitamins-Minerals (MULTIVITAMIN WOMEN 50+ PO) Take 1 tablet by mouth daily.    Marland Kitchen omega-3 acid ethyl esters (LOVAZA) 1 g capsule Take by mouth daily.    Marland Kitchen OVER THE COUNTER MEDICATION Take 1 capsule by mouth daily. Tumeric/Curcumin 500 mg taking     . polyethylene glycol powder (GLYCOLAX/MIRALAX) powder Take 17 g by mouth daily.  Take every 12 hours 3350 g 0  . topiramate (TOPAMAX) 100 MG tablet Take 1 tablet (100 mg total) by mouth daily. 90 tablet 3   No current facility-administered medications on file prior to visit.     PAST MEDICAL HISTORY: Past Medical History:  Diagnosis Date  . Anemia   . 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 Diverticulitis   . GERD (gastroesophageal reflux disease)   . Hx of adenomatous colonic polyps 12/29/2014  . Hypertension   . Obesity   . OSA (obstructive sleep apnea) 01/06/2016   Mild. CPAP discontinued 12/2016 following significant weight loss.   . OSA on CPAP 01/06/2016    PAST SURGICAL HISTORY: Past Surgical History:  Procedure Laterality Date  . ABDOMINAL HYSTERECTOMY    . COLONOSCOPY    . TUBAL LIGATION      SOCIAL HISTORY: Social History   Tobacco Use  . Smoking status: Never Smoker  . Smokeless tobacco: Never Used  Substance Use Topics  . Alcohol use: No    Alcohol/week: 0.0 standard drinks  . Drug use: No    FAMILY HISTORY: Family History  Problem Relation Age of Onset  . Hypertension Father   . Emphysema Maternal Grandmother   . Cancer Maternal Grandfather   . Hypertension Paternal Grandmother   . Cancer Paternal Grandmother   .  Diabetes Paternal Grandmother   . Hyperlipidemia Paternal Grandmother   . Hypertension Paternal Grandfather   . Diabetes Paternal Grandfather   . Hyperlipidemia Paternal Grandfather   . Colon cancer Neg Hx     ROS: Review of Systems  Constitutional: Negative for weight loss.  Gastrointestinal: Negative for nausea and vomiting.  Musculoskeletal:       Negative muscle weakness  Endo/Heme/Allergies:       Negative polyphagia Negative hypoglycemia    PHYSICAL EXAM: Blood pressure 118/76, pulse 74, temperature (!) 97.5 F (36.4 C), temperature  source Oral, height 5\' 4"  (1.626 m), weight 235 lb (106.6 kg), SpO2 96 %. Body mass index is 40.34 kg/m. Physical Exam  Constitutional: She is oriented to person, place, and time. She appears well-developed and well-nourished.  Cardiovascular: Normal rate.  Pulmonary/Chest: Effort normal.  Musculoskeletal: Normal range of motion.  Neurological: She is oriented to person, place, and time.  Skin: Skin is warm and dry.  Psychiatric: She has a normal mood and affect. Her behavior is normal.  Vitals reviewed.   RECENT LABS AND TESTS: BMET    Component Value Date/Time   NA 143 08/05/2017 0804   K 4.1 08/05/2017 0804   CL 106 08/05/2017 0804   CO2 21 08/05/2017 0804   GLUCOSE 116 (H) 08/05/2017 0804   GLUCOSE 138 (H) 03/10/2016 0919   BUN 17 08/05/2017 0804   CREATININE 0.93 08/05/2017 0804   CREATININE 0.86 01/31/2015 1129   CALCIUM 9.2 08/05/2017 0804   GFRNONAA 69 08/05/2017 0804   GFRAA 79 08/05/2017 0804   Lab Results  Component Value Date   HGBA1C 5.7 (H) 08/05/2017   HGBA1C 5.7 (H) 04/15/2017   HGBA1C 5.6 02/07/2017   HGBA1C 5.9 (H) 01/16/2016   HGBA1C 5.8 (H) 01/31/2015   Lab Results  Component Value Date   INSULIN 19.3 08/05/2017   INSULIN 18.2 04/15/2017   CBC    Component Value Date/Time   WBC 7.2 04/15/2017 0932   WBC 12.7 (H) 03/10/2016 0909   RBC 4.79 04/15/2017 0932   RBC 4.80 03/10/2016 0909   HGB 14.2 04/15/2017 0932   HCT 41.8 04/15/2017 0932   PLT 223 02/07/2017 1157   MCV 87 04/15/2017 0932   MCH 29.6 04/15/2017 0932   MCH 29.2 03/10/2016 0909   MCHC 34.0 04/15/2017 0932   MCHC 34.6 03/10/2016 0909   RDW 13.9 04/15/2017 0932   LYMPHSABS 2.5 04/15/2017 0932   MONOABS 1.0 03/10/2016 0909   EOSABS 0.1 04/15/2017 0932   BASOSABS 0.0 04/15/2017 0932   Iron/TIBC/Ferritin/ %Sat    Component Value Date/Time   FERRITIN 16 06/29/2009   Lipid Panel     Component Value Date/Time   CHOL 144 08/05/2017 0804   TRIG 140 08/05/2017 0804   HDL 51  08/05/2017 0804   CHOLHDL 3.5 02/07/2017 1157   CHOLHDL 3.5 01/31/2015 1129   VLDL 31 (H) 01/31/2015 1129   LDLCALC 65 08/05/2017 0804   Hepatic Function Panel     Component Value Date/Time   PROT 6.6 08/05/2017 0804   ALBUMIN 4.1 08/05/2017 0804   AST 17 08/05/2017 0804   ALT 24 08/05/2017 0804   ALKPHOS 76 08/05/2017 0804   BILITOT 0.3 08/05/2017 0804   BILIDIR 0.2 03/10/2016 0909   IBILI 0.7 03/10/2016 0909      Component Value Date/Time   TSH 2.430 04/15/2017 0932   TSH 2.710 02/07/2017 1157   TSH 3.310 01/16/2016 1141  Results for TYSHAWNA, ALARID "KIM" (MRN [id number])  as of 09/18/2017 08:58  Ref. Range 08/05/2017 08:04  Vitamin D, 25-Hydroxy Latest Ref Range: 30.0 - 100.0 ng/mL 33.9    ASSESSMENT AND PLAN: Prediabetes - Plan: metFORMIN (GLUCOPHAGE) 500 MG tablet  Vitamin D deficiency - Plan: Vitamin D, Ergocalciferol, (DRISDOL) 50000 units CAPS capsule  At risk for osteoporosis  Class 3 severe obesity with serious comorbidity and body mass index (BMI) of 40.0 to 44.9 in adult, unspecified obesity type ([place])  PLAN:  Pre-Diabetes Catherine Trevino will continue to work on weight loss, exercise, and decreasing simple carbohydrates in her diet to help decrease the risk of diabetes. We dicussed metformin including benefits and risks. She was informed that eating too many simple carbohydrates or too many calories at one sitting increases the likelihood of GI side effects. Elyshia agrees to continue taking metformin 500 mg q AM #30 and we will refill for 1 month. Delicia agrees to follow up with our clinic in 3 weeks as directed to monitor her progress.  Vitamin D Deficiency Catherine Trevino was informed that low vitamin D levels contributes to fatigue and are associated with obesity, breast, and colon cancer. Catherine Trevino agrees to continue taking prescription Vit D @50 ,000 IU every week #4 and we will refill for 1 month. She will follow up for routine testing of vitamin D, at least 2-3 times  per year. She was informed of the risk of over-replacement of vitamin D and agrees to not increase her dose unless she discusses this with [place] first. Catherine Trevino agrees to follow up with our clinic in 3 weeks.  At risk for osteopenia and osteoporosis Catherine Trevino was given extended  (15 minutes) osteoporosis prevention counseling today. Catherine Trevino is at risk for osteopenia and osteoporsis due to her vitamin D deficiency. She was encouraged to take her vitamin D and follow her higher calcium diet and increase strengthening exercise to help strengthen her bones and decrease her risk of osteopenia and osteoporosis.  Obesity Catherine Trevino is currently in the action stage of change. As such, her goal is to continue with weight loss efforts She has agreed to follow the Category 3 plan Catherine Trevino has been instructed to work up to a goal of 150 minutes of combined cardio and strengthening exercise per week for weight loss and overall health benefits. We discussed the following Behavioral Modification Strategies today: work on meal planning and easy cooking plans and travel eating strategies  We will consider rechecking RMR at next visit if Catherine Trevino continues with plateau.   Catherine Trevino has agreed to follow up with our clinic in 3 weeks. She was informed of the importance of frequent follow up visits to maximize her success with intensive lifestyle modifications for her multiple health conditions.   OBESITY BEHAVIORAL INTERVENTION VISIT  Today's visit was # 10.  Starting weight: 238 lbs Starting date: 04/15/17 Today's weight : 235 lbs Today's date: 09/17/2017 Total lbs lost to date: 3    ASK: We discussed the diagnosis of obesity with Catherine Trevino today and Catherine Trevino agreed to give [place] permission to discuss obesity behavioral modification therapy today.  ASSESS: Catherine Trevino has the diagnosis of obesity and her BMI today is 40.32 Catherine Trevino is in the action stage of change   ADVISE: Catherine Trevino was educated on the  multiple health risks of obesity as well as the benefit of weight loss to improve her health. She was advised of the need for long term treatment and the importance of lifestyle modifications.  AGREE: Multiple dietary modification options and treatment options were discussed and  Catherine Trevino agreed to the above obesity treatment plan.  Wilhemena Durie, am acting as transcriptionist for Abby Potash, PA-C I, Abby Potash, PA-C have reviewed above note and agree with its content

## 2017-10-08 ENCOUNTER — Ambulatory Visit (INDEPENDENT_AMBULATORY_CARE_PROVIDER_SITE_OTHER): Payer: BC Managed Care – PPO | Admitting: Physician Assistant

## 2017-10-08 VITALS — BP 115/77 | HR 80 | Temp 97.5°F | Ht 64.0 in | Wt 234.0 lb

## 2017-10-08 DIAGNOSIS — Z6841 Body Mass Index (BMI) 40.0 and over, adult: Secondary | ICD-10-CM

## 2017-10-08 DIAGNOSIS — E559 Vitamin D deficiency, unspecified: Secondary | ICD-10-CM | POA: Diagnosis not present

## 2017-10-08 DIAGNOSIS — Z9189 Other specified personal risk factors, not elsewhere classified: Secondary | ICD-10-CM

## 2017-10-08 DIAGNOSIS — F3289 Other specified depressive episodes: Secondary | ICD-10-CM

## 2017-10-08 MED ORDER — BUPROPION HCL ER (SR) 150 MG PO TB12: 150.0000 mg | ORAL_TABLET | Freq: Every day | ORAL | 0 refills | Status: DC

## 2017-10-08 MED ORDER — VITAMIN D (ERGOCALCIFEROL) 1.25 MG (50000 UNIT) PO CAPS: 50000.0000 [IU] | ORAL_CAPSULE | ORAL | 0 refills | Status: DC

## 2017-10-08 NOTE — Progress Notes (Signed)
Office: [phone]  /  Fax: [phone]   HPI:   Chief Complaint: OBESITY Catherine Trevino is here to discuss her progress with her obesity treatment plan. She is on the Category 3 plan and is following her eating plan approximately 50 % of the time. She states she is walking 30 to 45 minutes 3 times per week. Catherine Trevino did very well with weight loss. She was at the beach for a week and she reports that she was making smart choices. Catherine Trevino reports that she continues to struggle with getting all of the protein in at dinner. Her weight is 234 lb (106.1 kg) today and has had a weight loss of 1 pound over a period of 3 weeks since her last visit. She has lost 4 lbs since starting treatment with [place].  Vitamin D deficiency Catherine Trevino has a diagnosis of vitamin D deficiency. She is currently taking vit D and denies nausea, vomiting or muscle weakness.  At risk for osteopenia and osteoporosis Catherine Trevino is at higher risk of osteopenia and osteoporosis due to vitamin D deficiency.   Depression with emotional eating behaviors Catherine Trevino notes a decrease in cravings. She struggles with emotional eating and using food for comfort to the extent that it is negatively impacting her health. She often snacks when she is not hungry. Catherine Trevino sometimes feels she is out of control and then feels guilty that she made poor food choices. She has been working on behavior modification techniques to help reduce her emotional eating and has been somewhat successful. Her blood pressure is normal and she shows no sign of suicidal or homicidal ideations.  Depression screen Catherine [facility] 2/9 04/15/2017 02/07/2017 01/16/2016 10/25/2014  Decreased Interest 1 0 0 0  Down, Depressed, Hopeless 1 0 0 0  PHQ - 2 Score 2 0 0 0  Altered sleeping 1 - - -  Tired, decreased energy 1 - - -  Change in appetite 2 - - -  Feeling bad or failure about yourself  1 - - -  Trouble concentrating 1 - - -  Moving slowly or fidgety/restless 1 - - -  Suicidal  thoughts 0 - - -  PHQ-9 Score 9 - - -  Difficult doing work/chores Not difficult at all - - -      ALLERGIES: No Known Allergies  MEDICATIONS: Current Outpatient Medications on File Prior to Visit  Medication Sig Dispense Refill  . lisinopril-hydrochlorothiazide (PRINZIDE,ZESTORETIC) 10-12.5 MG tablet TAKE 1 TABLET DAILY. PLEASEMAKE AN APPOINTMENT WITH   YOUR DOCTOR 30 tablet 0  . metFORMIN (GLUCOPHAGE) 500 MG tablet Take 1 tablet (500 mg total) by mouth daily with breakfast. 30 tablet 0  . Multiple Vitamins-Minerals (MULTIVITAMIN WOMEN 50+ PO) Take 1 tablet by mouth daily.    Marland Kitchen omega-3 acid ethyl esters (LOVAZA) 1 g capsule Take by mouth daily.    Marland Kitchen OVER THE COUNTER MEDICATION Take 1 capsule by mouth daily. Tumeric/Curcumin 500 mg taking     . polyethylene glycol powder (GLYCOLAX/MIRALAX) powder Take 17 g by mouth daily. Take every 12 hours 3350 g 0  . topiramate (TOPAMAX) 100 MG tablet Take 1 tablet (100 mg total) by mouth daily. 90 tablet 3   No current facility-administered medications on file prior to visit.     PAST MEDICAL HISTORY: Past Medical History:  Diagnosis Date  . Anemia   . ANGIODYSPLASIA, INTESTINE, WITHOUT HEMORRHAGE 08/16/2009   Qualifier: Diagnosis of  By: Carlean Purl MD, Dimas Millin Diabetes mellitus without complication ([place])  pt stated prediabetes, not on meds  . Diverticulitis   . GERD (gastroesophageal reflux disease)   . Hx of adenomatous colonic polyps 12/29/2014  . Hypertension   . Obesity   . OSA (obstructive sleep apnea) 01/06/2016   Mild. CPAP discontinued 12/2016 following significant weight loss.   . OSA on CPAP 01/06/2016    PAST SURGICAL HISTORY: Past Surgical History:  Procedure Laterality Date  . ABDOMINAL HYSTERECTOMY    . COLONOSCOPY    . TUBAL LIGATION      SOCIAL HISTORY: Social History   Tobacco Use  . Smoking status: Never Smoker  . Smokeless tobacco: Never Used  Substance Use Topics  . Alcohol use: No     Alcohol/week: 0.0 standard drinks  . Drug use: No    FAMILY HISTORY: Family History  Problem Relation Age of Onset  . Hypertension Father   . Emphysema Maternal Grandmother   . Cancer Maternal Grandfather   . Hypertension Paternal Grandmother   . Cancer Paternal Grandmother   . Diabetes Paternal Grandmother   . Hyperlipidemia Paternal Grandmother   . Hypertension Paternal Grandfather   . Diabetes Paternal Grandfather   . Hyperlipidemia Paternal Grandfather   . Colon cancer Neg Hx     ROS: Review of Systems  Constitutional: Positive for weight loss.  Gastrointestinal: Negative for nausea and vomiting.  Musculoskeletal:       Negative for muscle weakness  Psychiatric/Behavioral: Positive for depression. Negative for suicidal ideas.    PHYSICAL EXAM: Blood pressure 115/77, pulse 80, temperature (!) 97.5 F (36.4 C), temperature source Oral, height 5\' 4"  (1.626 m), weight 234 lb (106.1 kg), SpO2 97 %. Body mass index is 40.17 kg/m. Physical Exam  Constitutional: She is oriented to person, place, and time. She appears well-developed and well-nourished.  Cardiovascular: Normal rate.  Pulmonary/Chest: Effort normal.  Musculoskeletal: Normal range of motion.  Neurological: She is oriented to person, place, and time.  Skin: Skin is warm and dry.  Psychiatric: She has a normal mood and affect. Her behavior is normal.  Vitals reviewed.   RECENT LABS AND TESTS: BMET    Component Value Date/Time   NA 143 08/05/2017 0804   K 4.1 08/05/2017 0804   CL 106 08/05/2017 0804   CO2 21 08/05/2017 0804   GLUCOSE 116 (H) 08/05/2017 0804   GLUCOSE 138 (H) 03/10/2016 0919   BUN 17 08/05/2017 0804   CREATININE 0.93 08/05/2017 0804   CREATININE 0.86 01/31/2015 1129   CALCIUM 9.2 08/05/2017 0804   GFRNONAA 69 08/05/2017 0804   GFRAA 79 08/05/2017 0804   Lab Results  Component Value Date   HGBA1C 5.7 (H) 08/05/2017   HGBA1C 5.7 (H) 04/15/2017   HGBA1C 5.6 02/07/2017   HGBA1C 5.9  (H) 01/16/2016   HGBA1C 5.8 (H) 01/31/2015   Lab Results  Component Value Date   INSULIN 19.3 08/05/2017   INSULIN 18.2 04/15/2017   CBC    Component Value Date/Time   WBC 7.2 04/15/2017 0932   WBC 12.7 (H) 03/10/2016 0909   RBC 4.79 04/15/2017 0932   RBC 4.80 03/10/2016 0909   HGB 14.2 04/15/2017 0932   HCT 41.8 04/15/2017 0932   PLT 223 02/07/2017 1157   MCV 87 04/15/2017 0932   MCH 29.6 04/15/2017 0932   MCH 29.2 03/10/2016 0909   MCHC 34.0 04/15/2017 0932   MCHC 34.6 03/10/2016 0909   RDW 13.9 04/15/2017 0932   LYMPHSABS 2.5 04/15/2017 0932   MONOABS 1.0 03/10/2016 0909   EOSABS  0.1 04/15/2017 0932   BASOSABS 0.0 04/15/2017 0932   Iron/TIBC/Ferritin/ %Sat    Component Value Date/Time   FERRITIN 16 06/29/2009   Lipid Panel     Component Value Date/Time   CHOL 144 08/05/2017 0804   TRIG 140 08/05/2017 0804   HDL 51 08/05/2017 0804   CHOLHDL 3.5 02/07/2017 1157   CHOLHDL 3.5 01/31/2015 1129   VLDL 31 (H) 01/31/2015 1129   LDLCALC 65 08/05/2017 0804   Hepatic Function Panel     Component Value Date/Time   PROT 6.6 08/05/2017 0804   ALBUMIN 4.1 08/05/2017 0804   AST 17 08/05/2017 0804   ALT 24 08/05/2017 0804   ALKPHOS 76 08/05/2017 0804   BILITOT 0.3 08/05/2017 0804   BILIDIR 0.2 03/10/2016 0909   IBILI 0.7 03/10/2016 0909      Component Value Date/Time   TSH 2.430 04/15/2017 0932   TSH 2.710 02/07/2017 1157   TSH 3.310 01/16/2016 1141   Results for WLADYSLAWA, DISBRO "KIM" (MRN [id number]) as of 10/08/2017 12:49  Ref. Range 08/05/2017 08:04  Vitamin D, 25-Hydroxy Latest Ref Range: 30.0 - 100.0 ng/mL 33.9   ASSESSMENT AND PLAN: Vitamin D deficiency - Plan: Vitamin D, Ergocalciferol, (DRISDOL) 50000 units CAPS capsule  Other depression - with emotional eating - Plan: buPROPion (WELLBUTRIN SR) 150 MG 12 hr tablet  At risk for osteoporosis  Class 3 severe obesity with serious comorbidity and body mass index (BMI) of 40.0 to 44.9 in adult,  unspecified obesity type ([place])  PLAN:  Vitamin D Deficiency Rosibel was informed that low vitamin D levels contributes to fatigue and are associated with obesity, breast, and colon cancer. She agrees to continue to take prescription Vit D @50 ,000 IU every week #4 with no refills and will follow up for routine testing of vitamin D, at least 2-3 times per year. She was informed of the risk of over-replacement of vitamin D and agrees to not increase her dose unless she discusses this with [place] first. Vernell agrees to follow up as directed.  At risk for osteopenia and osteoporosis Lovelle was given extended  (15 minutes) osteoporosis prevention counseling today. Marca is at risk for osteopenia and osteoporosis due to her vitamin D deficiency. She was encouraged to take her vitamin D and follow her higher calcium diet and increase strengthening exercise to help strengthen her bones and decrease her risk of osteopenia and osteoporosis.  Depression with Emotional Eating Behaviors We discussed behavior modification techniques today to help Anaiah deal with her emotional eating and depression. She has agreed to continue Bupropion ( Wellbutrin SR ) 150 mg qd #30 with no refills and follow up as directed.  Obesity Iridian is currently in the action stage of change. As such, her goal is to continue with weight loss efforts She has agreed to follow the Category 3 plan Tamee has been instructed to work up to a goal of 150 minutes of combined cardio and strengthening exercise per week for weight loss and overall health benefits. We discussed the following Behavioral Modification Strategies today: keeping healthy foods in the home and work on meal planning and easy cooking plans  Zauria has agreed to follow up with our clinic in 2 weeks. She was informed of the importance of frequent follow up visits to maximize her success with intensive lifestyle modifications for her multiple health  conditions.   OBESITY BEHAVIORAL INTERVENTION VISIT  Today's visit was # 11   Starting weight: 238 lbs Starting date: 04/15/17 Today's weight : @  234 lbs Today's date: 10/08/2017 Total lbs lost to date: 4   ASK: We discussed the diagnosis of obesity with Donata Duff today and Briauna agreed to give [place] permission to discuss obesity behavioral modification therapy today.  ASSESS: Bettyjane has the diagnosis of obesity and her BMI today is 40.15 Mehr is in the action stage of change   ADVISE: Urijah was educated on the multiple health risks of obesity as well as the benefit of weight loss to improve her health. She was advised of the need for long term treatment and the importance of lifestyle modifications to improve her current health and to decrease her risk of future health problems.  AGREE: Multiple dietary modification options and treatment options were discussed and  Nioka agreed to follow the recommendations documented in the above note.  ARRANGE: Mychaela was educated on the importance of frequent visits to treat obesity as outlined per CMS and USPSTF guidelines and agreed to schedule her next follow up appointment today.  Corey Skains, am acting as transcriptionist for Abby Potash, PA-C I, Abby Potash, PA-C have reviewed above note and agree with its content

## 2017-10-21 ENCOUNTER — Encounter (INDEPENDENT_AMBULATORY_CARE_PROVIDER_SITE_OTHER): Payer: Self-pay | Admitting: Physician Assistant

## 2017-10-21 ENCOUNTER — Ambulatory Visit (INDEPENDENT_AMBULATORY_CARE_PROVIDER_SITE_OTHER): Payer: BC Managed Care – PPO | Admitting: Physician Assistant

## 2017-10-21 VITALS — BP 114/77 | HR 96 | Temp 97.9°F | Ht 64.0 in | Wt 237.0 lb

## 2017-10-21 DIAGNOSIS — E559 Vitamin D deficiency, unspecified: Secondary | ICD-10-CM

## 2017-10-21 DIAGNOSIS — Z9189 Other specified personal risk factors, not elsewhere classified: Secondary | ICD-10-CM | POA: Diagnosis not present

## 2017-10-21 DIAGNOSIS — Z6841 Body Mass Index (BMI) 40.0 and over, adult: Secondary | ICD-10-CM

## 2017-10-21 DIAGNOSIS — R7303 Prediabetes: Secondary | ICD-10-CM

## 2017-10-21 MED ORDER — METFORMIN HCL 500 MG PO TABS: 500.0000 mg | ORAL_TABLET | Freq: Every day | ORAL | 0 refills | Status: DC

## 2017-10-22 NOTE — Progress Notes (Signed)
Office: [phone]  /  Fax: [phone]   HPI:   Chief Complaint: OBESITY Catherine Trevino is here to discuss her progress with her obesity treatment plan. She is on the Category 3 plan and is following her eating plan approximately 90 % of the time. She states she is walking for 30 minutes 4 times per week. Catherine Trevino reports she has been eating more simple carbohydrates recently. She states that she is not hungry but is eating all the food on the plan. She is loggin her foods in [employer].  Her weight is 237 lb (107.5 kg) today and has gained 3 pounds since her last visit. She has lost 1 lb since starting treatment with [place].  Pre-Diabetes Catherine Trevino has a diagnosis of pre-diabetes based on her elevated Hgb A1c and was informed this puts her at greater risk of developing diabetes. She is on metformin and denies nausea, vomiting, or diarrhea. She continues to work on diet and exercise to decrease risk of diabetes. She denies hypoglycemia or polyphagia.  At risk for diabetes Catherine Trevino is at higher than average risk for developing diabetes due to her obesity and pre-diabetes. She currently denies polyuria or polydipsia.  Vitamin D Deficiency Catherine Trevino has a diagnosis of vitamin D deficiency. She is on prescription Vit D and denies nausea, vomiting or muscle weakness.  ALLERGIES: No Known Allergies  MEDICATIONS: Current Outpatient Medications on File Prior to Visit  Medication Sig Dispense Refill  . buPROPion (WELLBUTRIN SR) 150 MG 12 hr tablet Take 1 tablet (150 mg total) by mouth daily. 30 tablet 0  . lisinopril-hydrochlorothiazide (PRINZIDE,ZESTORETIC) 10-12.5 MG tablet TAKE 1 TABLET DAILY. PLEASEMAKE AN APPOINTMENT WITH   YOUR DOCTOR 30 tablet 0  . Multiple Vitamins-Minerals (MULTIVITAMIN WOMEN 50+ PO) Take 1 tablet by mouth daily.    Marland Kitchen omega-3 acid ethyl esters (LOVAZA) 1 g capsule Take by mouth daily.    Marland Kitchen OVER THE COUNTER MEDICATION Take 1 capsule by mouth daily. Tumeric/Curcumin 500 mg  taking     . polyethylene glycol powder (GLYCOLAX/MIRALAX) powder Take 17 g by mouth daily. Take every 12 hours 3350 g 0  . topiramate (TOPAMAX) 100 MG tablet Take 1 tablet (100 mg total) by mouth daily. 90 tablet 3  . Vitamin D, Ergocalciferol, (DRISDOL) 50000 units CAPS capsule Take 1 capsule (50,000 Units total) by mouth every 7 (seven) days. 4 capsule 0   No current facility-administered medications on file prior to visit.     PAST MEDICAL HISTORY: Past Medical History:  Diagnosis Date  . Anemia   . ANGIODYSPLASIA, INTESTINE, WITHOUT HEMORRHAGE 08/16/2009   Qualifier: Diagnosis of  By: Carlean Purl MD, Dimas Millin Diabetes mellitus without complication ([place])    pt stated prediabetes, not on meds  . Diverticulitis   . GERD (gastroesophageal reflux disease)   . Hx of adenomatous colonic polyps 12/29/2014  . Hypertension   . Obesity   . OSA (obstructive sleep apnea) 01/06/2016   Mild. CPAP discontinued 12/2016 following significant weight loss.   . OSA on CPAP 01/06/2016    PAST SURGICAL HISTORY: Past Surgical History:  Procedure Laterality Date  . ABDOMINAL HYSTERECTOMY    . COLONOSCOPY    . TUBAL LIGATION      SOCIAL HISTORY: Social History   Tobacco Use  . Smoking status: Never Smoker  . Smokeless tobacco: Never Used  Substance Use Topics  . Alcohol use: No    Alcohol/week: 0.0 standard drinks  . Drug use: No    FAMILY HISTORY:  Family History  Problem Relation Age of Onset  . Hypertension Father   . Emphysema Maternal Grandmother   . Cancer Maternal Grandfather   . Hypertension Paternal Grandmother   . Cancer Paternal Grandmother   . Diabetes Paternal Grandmother   . Hyperlipidemia Paternal Grandmother   . Hypertension Paternal Grandfather   . Diabetes Paternal Grandfather   . Hyperlipidemia Paternal Grandfather   . Colon cancer Neg Hx     ROS: Review of Systems  Constitutional: Negative for weight loss.  Gastrointestinal: Negative for diarrhea,  nausea and vomiting.  Genitourinary: Negative for frequency.  Musculoskeletal:       Negative muscle weakness  Endo/Heme/Allergies: Negative for polydipsia.       Negative hypoglycemia Negative polyphagia    PHYSICAL EXAM: Blood pressure 114/77, pulse 96, temperature 97.9 F (36.6 C), temperature source Oral, height 5\' 4"  (1.626 m), weight 237 lb (107.5 kg), SpO2 97 %. Body mass index is 40.68 kg/m. Physical Exam  Constitutional: She is oriented to person, place, and time. She appears well-developed and well-nourished.  Cardiovascular: Normal rate.  Pulmonary/Chest: Effort normal.  Musculoskeletal: Normal range of motion.  Neurological: She is oriented to person, place, and time.  Skin: Skin is warm and dry.  Psychiatric: She has a normal mood and affect. Her behavior is normal.  Vitals reviewed.   RECENT LABS AND TESTS: BMET    Component Value Date/Time   NA 143 08/05/2017 0804   K 4.1 08/05/2017 0804   CL 106 08/05/2017 0804   CO2 21 08/05/2017 0804   GLUCOSE 116 (H) 08/05/2017 0804   GLUCOSE 138 (H) 03/10/2016 0919   BUN 17 08/05/2017 0804   CREATININE 0.93 08/05/2017 0804   CREATININE 0.86 01/31/2015 1129   CALCIUM 9.2 08/05/2017 0804   GFRNONAA 69 08/05/2017 0804   GFRAA 79 08/05/2017 0804   Lab Results  Component Value Date   HGBA1C 5.7 (H) 08/05/2017   HGBA1C 5.7 (H) 04/15/2017   HGBA1C 5.6 02/07/2017   HGBA1C 5.9 (H) 01/16/2016   HGBA1C 5.8 (H) 01/31/2015   Lab Results  Component Value Date   INSULIN 19.3 08/05/2017   INSULIN 18.2 04/15/2017   CBC    Component Value Date/Time   WBC 7.2 04/15/2017 0932   WBC 12.7 (H) 03/10/2016 0909   RBC 4.79 04/15/2017 0932   RBC 4.80 03/10/2016 0909   HGB 14.2 04/15/2017 0932   HCT 41.8 04/15/2017 0932   PLT 223 02/07/2017 1157   MCV 87 04/15/2017 0932   MCH 29.6 04/15/2017 0932   MCH 29.2 03/10/2016 0909   MCHC 34.0 04/15/2017 0932   MCHC 34.6 03/10/2016 0909   RDW 13.9 04/15/2017 0932   LYMPHSABS 2.5  04/15/2017 0932   MONOABS 1.0 03/10/2016 0909   EOSABS 0.1 04/15/2017 0932   BASOSABS 0.0 04/15/2017 0932   Iron/TIBC/Ferritin/ %Sat    Component Value Date/Time   FERRITIN 16 06/29/2009   Lipid Panel     Component Value Date/Time   CHOL 144 08/05/2017 0804   TRIG 140 08/05/2017 0804   HDL 51 08/05/2017 0804   CHOLHDL 3.5 02/07/2017 1157   CHOLHDL 3.5 01/31/2015 1129   VLDL 31 (H) 01/31/2015 1129   LDLCALC 65 08/05/2017 0804   Hepatic Function Panel     Component Value Date/Time   PROT 6.6 08/05/2017 0804   ALBUMIN 4.1 08/05/2017 0804   AST 17 08/05/2017 0804   ALT 24 08/05/2017 0804   ALKPHOS 76 08/05/2017 0804   BILITOT 0.3 08/05/2017 0804  BILIDIR 0.2 03/10/2016 0909   IBILI 0.7 03/10/2016 0909      Component Value Date/Time   TSH 2.430 04/15/2017 0932   TSH 2.710 02/07/2017 1157   TSH 3.310 01/16/2016 1141  Results for DAVY, FAUGHT "KIM" (MRN [id number]) as of 10/22/2017 17:01  Ref. Range 08/05/2017 08:04  Vitamin D, 25-Hydroxy Latest Ref Range: 30.0 - 100.0 ng/mL 33.9    ASSESSMENT AND PLAN: Prediabetes - Plan: metFORMIN (GLUCOPHAGE) 500 MG tablet  Vitamin D deficiency  At risk for diabetes mellitus  Class 3 severe obesity with serious comorbidity and body mass index (BMI) of 40.0 to 44.9 in adult, unspecified obesity type ([place])  PLAN:  Pre-Diabetes Jamilynn will continue to work on weight loss, exercise, and decreasing simple carbohydrates in her diet to help decrease the risk of diabetes. We dicussed metformin including benefits and risks. She was informed that eating too many simple carbohydrates or too many calories at one sitting increases the likelihood of GI side effects. Catherine Trevino agrees to continue taking metformin 500 mg q AM #30 and we will refill for 1 month. Catherine Trevino agrees to follow up with our clinic in 2 weeks as directed to monitor her progress.  Diabetes risk counselling Catherine Trevino was given extended (15 minutes) diabetes prevention  counseling today. She is 57 y.o. female and has risk factors for diabetes including obesity and pre-diabetes. We discussed intensive lifestyle modifications today with an emphasis on weight loss as well as increasing exercise and decreasing simple carbohydrates in her diet.  Vitamin D Deficiency Catherine Trevino was informed that low vitamin D levels contributes to fatigue and are associated with obesity, breast, and colon cancer. Catherine Trevino agrees to continue taking prescription Vit D @50 ,000 IU every week and will follow up for routine testing of vitamin D, at least 2-3 times per year. She was informed of the risk of over-replacement of vitamin D and agrees to not increase her dose unless she discusses this with [place] first. Catherine Trevino agrees to follow up with our clinic in 2 weeks.  Obesity Catherine Trevino is currently in the action stage of change. As such, her goal is to continue with weight loss efforts She has agreed to follow the Category 3 plan Catherine Trevino has been instructed to work up to a goal of 150 minutes of combined cardio and strengthening exercise per week for weight loss and overall health benefits. We discussed the following Behavioral Modification Strategies today: work on meal planning and easy cooking plans and better snacking choices We will check IC at next visit if needed.  Deriana has agreed to follow up with our clinic in 2 weeks. She was informed of the importance of frequent follow up visits to maximize her success with intensive lifestyle modifications for her multiple health conditions.   OBESITY BEHAVIORAL INTERVENTION VISIT  Today's visit was # 12   Starting weight: 238 lbs Starting date: 04/15/17 Today's weight : 237 lbs Today's date: 10/21/2017 Total lbs lost to date: 1    ASK: We discussed the diagnosis of obesity with Catherine Trevino today and Catherine Trevino agreed to give [place] permission to discuss obesity behavioral modification therapy today.  ASSESS: Catherine Trevino has the  diagnosis of obesity and her BMI today is 40.66 Catherine Trevino is in the action stage of change   ADVISE: Miyah was educated on the multiple health risks of obesity as well as the benefit of weight loss to improve her health. She was advised of the need for long term treatment and the importance of lifestyle modifications.  AGREE: Multiple dietary modification options and treatment options were discussed and  [place] agreed to the above obesity treatment plan.  Wilhemena Durie, am acting as transcriptionist for Abby Potash, PA-C I, Abby Potash, PA-C have reviewed above note and agree with its content

## 2017-10-24 ENCOUNTER — Other Ambulatory Visit: Payer: Self-pay | Admitting: Physician Assistant

## 2017-10-24 DIAGNOSIS — Z1231 Encounter for screening mammogram for malignant neoplasm of breast: Secondary | ICD-10-CM

## 2017-11-04 ENCOUNTER — Ambulatory Visit (INDEPENDENT_AMBULATORY_CARE_PROVIDER_SITE_OTHER): Payer: BC Managed Care – PPO | Admitting: Physician Assistant

## 2017-11-10 ENCOUNTER — Ambulatory Visit (INDEPENDENT_AMBULATORY_CARE_PROVIDER_SITE_OTHER): Payer: BC Managed Care – PPO | Admitting: Physician Assistant

## 2017-11-10 VITALS — BP 129/81 | HR 70 | Ht 64.0 in | Wt 236.0 lb

## 2017-11-10 DIAGNOSIS — I10 Essential (primary) hypertension: Secondary | ICD-10-CM

## 2017-11-10 DIAGNOSIS — Z9189 Other specified personal risk factors, not elsewhere classified: Secondary | ICD-10-CM

## 2017-11-10 DIAGNOSIS — R7303 Prediabetes: Secondary | ICD-10-CM

## 2017-11-10 DIAGNOSIS — Z6841 Body Mass Index (BMI) 40.0 and over, adult: Secondary | ICD-10-CM

## 2017-11-10 DIAGNOSIS — F3289 Other specified depressive episodes: Secondary | ICD-10-CM | POA: Diagnosis not present

## 2017-11-10 DIAGNOSIS — E559 Vitamin D deficiency, unspecified: Secondary | ICD-10-CM

## 2017-11-10 MED ORDER — BUPROPION HCL ER (SR) 150 MG PO TB12: 150.0000 mg | ORAL_TABLET | Freq: Every day | ORAL | 0 refills | Status: DC

## 2017-11-10 MED ORDER — LISINOPRIL-HYDROCHLOROTHIAZIDE 10-12.5 MG PO TABS: ORAL_TABLET | ORAL | 0 refills | Status: DC

## 2017-11-10 MED ORDER — VITAMIN D (ERGOCALCIFEROL) 1.25 MG (50000 UNIT) PO CAPS: 50000.0000 [IU] | ORAL_CAPSULE | ORAL | 0 refills | Status: DC

## 2017-11-10 MED ORDER — METFORMIN HCL 500 MG PO TABS: 500.0000 mg | ORAL_TABLET | Freq: Every day | ORAL | 0 refills | Status: DC

## 2017-11-11 NOTE — Progress Notes (Signed)
Office: [phone]  /  Fax: [phone]   HPI:   Chief Complaint: OBESITY Catherine Trevino is here to discuss her progress with her obesity treatment plan. She is on the Category 3 plan and is following her eating plan approximately 70 % of the time. She states she is exercising 0 minutes 0 times per week. Ashlon did well with weight loss. She reports that she hurt her back recently, which caused a decrease in appetite.  Her weight is 236 lb (107 kg) today and has had a weight loss of 1 pound over a period of 3 weeks since her last visit. She has lost 2 lbs since starting treatment with [place].  Hypertension Catherine Trevino is a 57 y.o. female with hypertension. Tess's blood pressure is normal and she denies chest pain. She is working weight loss to help control her blood pressure with the goal of decreasing her risk of heart attack and stroke. Catherine Trevino's blood pressure is currently controlled.  At risk for cardiovascular disease Catherine Trevino is at a higher than average risk for cardiovascular disease due to obesity and hypertension. She currently denies any chest pain.  Vitamin D Deficiency Catherine Trevino has a diagnosis of vitamin D deficiency. She is currently taking prescription Vit D and denies nausea, vomiting or muscle weakness.  Pre-Diabetes Catherine Trevino has a diagnosis of prediabetes based on her elevated HgA1c and was informed this puts her at greater risk of developing diabetes. She denies nausea, vomiting, or diarrhea on metformin, and continues to work on diet and exercise to decrease risk of diabetes. She denies polyphagia or hypoglycemia.  Depression with emotional eating behaviors Catherine Trevino denies cravings and her blood pressure is controlled. Catherine Trevino struggles with emotional eating and using food for comfort to the extent that it is negatively impacting her health. She often snacks when she is not hungry. Catherine Trevino sometimes feels she is out of control and then feels guilty that she made  poor food choices. She has been working on behavior modification techniques to help reduce her emotional eating and has been somewhat successful. She shows no sign of suicidal or homicidal ideations.  Depression screen [facility] 2/9 04/15/2017 02/07/2017 01/16/2016 10/25/2014  Decreased Interest 1 0 0 0  Down, Depressed, Hopeless 1 0 0 0  PHQ - 2 Score 2 0 0 0  Altered sleeping 1 - - -  Tired, decreased energy 1 - - -  Change in appetite 2 - - -  Feeling bad or failure about yourself  1 - - -  Trouble concentrating 1 - - -  Moving slowly or fidgety/restless 1 - - -  Suicidal thoughts 0 - - -  PHQ-9 Score 9 - - -  Difficult doing work/chores Not difficult at all - - -    ALLERGIES: No Known Allergies  MEDICATIONS: Current Outpatient Medications on File Prior to Visit  Medication Sig Dispense Refill  . Multiple Vitamins-Minerals (MULTIVITAMIN WOMEN 50+ PO) Take 1 tablet by mouth daily.    Marland Kitchen omega-3 acid ethyl esters (LOVAZA) 1 g capsule Take by mouth daily.    Marland Kitchen OVER THE COUNTER MEDICATION Take 1 capsule by mouth daily. Tumeric/Curcumin 500 mg taking     . polyethylene glycol powder (GLYCOLAX/MIRALAX) powder Take 17 g by mouth daily. Take every 12 hours 3350 g 0  . topiramate (TOPAMAX) 100 MG tablet Take 1 tablet (100 mg total) by mouth daily. 90 tablet 3   No current facility-administered medications on file prior to visit.     PAST MEDICAL  HISTORY: Past Medical History:  Diagnosis Date  . Anemia   . ANGIODYSPLASIA, INTESTINE, WITHOUT HEMORRHAGE 08/16/2009   Qualifier: Diagnosis of  By: Carlean Purl MD, Dimas Millin Diabetes mellitus without complication ([place])    pt stated prediabetes, not on meds  . Diverticulitis   . GERD (gastroesophageal reflux disease)   . Hx of adenomatous colonic polyps 12/29/2014  . Hypertension   . Obesity   . OSA (obstructive sleep apnea) 01/06/2016   Mild. CPAP discontinued 12/2016 following significant weight loss.   . OSA on CPAP 01/06/2016    PAST  SURGICAL HISTORY: Past Surgical History:  Procedure Laterality Date  . ABDOMINAL HYSTERECTOMY    . COLONOSCOPY    . TUBAL LIGATION      SOCIAL HISTORY: Social History   Tobacco Use  . Smoking status: Never Smoker  . Smokeless tobacco: Never Used  Substance Use Topics  . Alcohol use: No    Alcohol/week: 0.0 standard drinks  . Drug use: No    FAMILY HISTORY: Family History  Problem Relation Age of Onset  . Hypertension Father   . Emphysema Maternal Grandmother   . Cancer Maternal Grandfather   . Hypertension Paternal Grandmother   . Cancer Paternal Grandmother   . Diabetes Paternal Grandmother   . Hyperlipidemia Paternal Grandmother   . Hypertension Paternal Grandfather   . Diabetes Paternal Grandfather   . Hyperlipidemia Paternal Grandfather   . Colon cancer Neg Hx     ROS: Review of Systems  Constitutional: Positive for weight loss.  Cardiovascular: Negative for chest pain.  Gastrointestinal: Negative for diarrhea, nausea and vomiting.  Musculoskeletal:       Negative muscle weakness  Endo/Heme/Allergies:       Negative hypoglycemia Negative polyphagia  Psychiatric/Behavioral: Positive for depression. Negative for suicidal ideas.    PHYSICAL EXAM: Blood pressure 129/81, pulse 70, height 5\' 4"  (1.626 m), weight 236 lb (107 kg), SpO2 97 %. Body mass index is 40.51 kg/m. Physical Exam  Constitutional: She is oriented to person, place, and time. She appears well-developed and well-nourished.  Cardiovascular: Normal rate.  Pulmonary/Chest: Effort normal.  Musculoskeletal: Normal range of motion.  Neurological: She is oriented to person, place, and time.  Skin: Skin is warm and dry.  Psychiatric: She has a normal mood and affect. Her behavior is normal.  Vitals reviewed.   RECENT LABS AND TESTS: BMET    Component Value Date/Time   NA 143 08/05/2017 0804   K 4.1 08/05/2017 0804   CL 106 08/05/2017 0804   CO2 21 08/05/2017 0804   GLUCOSE 116 (H)  08/05/2017 0804   GLUCOSE 138 (H) 03/10/2016 0919   BUN 17 08/05/2017 0804   CREATININE 0.93 08/05/2017 0804   CREATININE 0.86 01/31/2015 1129   CALCIUM 9.2 08/05/2017 0804   GFRNONAA 69 08/05/2017 0804   GFRAA 79 08/05/2017 0804   Lab Results  Component Value Date   HGBA1C 5.7 (H) 08/05/2017   HGBA1C 5.7 (H) 04/15/2017   HGBA1C 5.6 02/07/2017   HGBA1C 5.9 (H) 01/16/2016   HGBA1C 5.8 (H) 01/31/2015   Lab Results  Component Value Date   INSULIN 19.3 08/05/2017   INSULIN 18.2 04/15/2017   CBC    Component Value Date/Time   WBC 7.2 04/15/2017 0932   WBC 12.7 (H) 03/10/2016 0909   RBC 4.79 04/15/2017 0932   RBC 4.80 03/10/2016 0909   HGB 14.2 04/15/2017 0932   HCT 41.8 04/15/2017 0932   PLT 223 02/07/2017 1157  MCV 87 04/15/2017 0932   MCH 29.6 04/15/2017 0932   MCH 29.2 03/10/2016 0909   MCHC 34.0 04/15/2017 0932   MCHC 34.6 03/10/2016 0909   RDW 13.9 04/15/2017 0932   LYMPHSABS 2.5 04/15/2017 0932   MONOABS 1.0 03/10/2016 0909   EOSABS 0.1 04/15/2017 0932   BASOSABS 0.0 04/15/2017 0932   Iron/TIBC/Ferritin/ %Sat    Component Value Date/Time   FERRITIN 16 06/29/2009   Lipid Panel     Component Value Date/Time   CHOL 144 08/05/2017 0804   TRIG 140 08/05/2017 0804   HDL 51 08/05/2017 0804   CHOLHDL 3.5 02/07/2017 1157   CHOLHDL 3.5 01/31/2015 1129   VLDL 31 (H) 01/31/2015 1129   LDLCALC 65 08/05/2017 0804   Hepatic Function Panel     Component Value Date/Time   PROT 6.6 08/05/2017 0804   ALBUMIN 4.1 08/05/2017 0804   AST 17 08/05/2017 0804   ALT 24 08/05/2017 0804   ALKPHOS 76 08/05/2017 0804   BILITOT 0.3 08/05/2017 0804   BILIDIR 0.2 03/10/2016 0909   IBILI 0.7 03/10/2016 0909      Component Value Date/Time   TSH 2.430 04/15/2017 0932   TSH 2.710 02/07/2017 1157   TSH 3.310 01/16/2016 1141  Results for SHANIQUA, GUILLOT "KIM" (MRN [id number]) as of 11/11/2017 18:00  Ref. Range 08/05/2017 08:04  Vitamin D, 25-Hydroxy Latest Ref Range: 30.0 -  100.0 ng/mL 33.9    ASSESSMENT AND PLAN: Essential hypertension - Plan: lisinopril-hydrochlorothiazide (PRINZIDE,ZESTORETIC) 10-12.5 MG tablet  Vitamin D deficiency - Plan: Vitamin D, Ergocalciferol, (DRISDOL) 50000 units CAPS capsule  Prediabetes - Plan: metFORMIN (GLUCOPHAGE) 500 MG tablet  Other depression - with emotional eating - Plan: buPROPion (WELLBUTRIN SR) 150 MG 12 hr tablet  At risk for heart disease  Class 3 severe obesity with serious comorbidity and body mass index (BMI) of 40.0 to 44.9 in adult, unspecified obesity type ([place])  PLAN:  Hypertension We discussed sodium restriction, working on healthy weight loss, and a regular exercise program as the means to achieve improved blood pressure control. Catherine Trevino agreed with this plan and agreed to follow up as directed. We will continue to monitor her blood pressure as well as her progress with the above lifestyle modifications. Catherine Trevino agrees to continue taking lisinopril-hydrochlorothiazide 10-12.5 mg qd #30 and we will refill for 1 month. She will watch for signs of hypotension as she continues her lifestyle modifications. Catherine Trevino agrees to follow up with our clinic in 2 weeks.  Cardiovascular risk counselling Catherine Trevino was given extended (15 minutes) coronary artery disease prevention counseling today. She is 57 y.o. female and has risk factors for heart disease including obesity and hypertension. We discussed intensive lifestyle modifications today with an emphasis on specific weight loss instructions and strategies. Pt was also informed of the importance of increasing exercise and decreasing saturated fats to help prevent heart disease.  Vitamin D Deficiency Catherine Trevino was informed that low vitamin D levels contributes to fatigue and are associated with obesity, breast, and colon cancer. Catherine Trevino agrees to continue taking prescription Vit D @50 ,000 IU every week #4 and we will refill for 1 month. She will follow up for routine  testing of vitamin D, at least 2-3 times per year. She was informed of the risk of over-replacement of vitamin D and agrees to not increase her dose unless she discusses this with [place] first. Catherine Trevino agrees to follow up with our clinic in 2 weeks.  Pre-Diabetes Catherine Trevino will continue to work on weight loss,  exercise, and decreasing simple carbohydrates in her diet to help decrease the risk of diabetes. We dicussed metformin including benefits and risks. She was informed that eating too many simple carbohydrates or too many calories at one sitting increases the likelihood of GI side effects. Catherine Trevino agrees to continue taking metformin 500 mg q AM #30 and we will refill for 1 month. Catherine Trevino agrees to follow up with our clinic in 2 weeks as directed to monitor her progress.  Depression with Emotional Eating Behaviors We discussed behavior modification techniques today to help Catherine Trevino deal with her emotional eating and depression. Catherine Trevino agrees to continue taking Wellbutrin SR 150 mg qd #30 and we will refill for 1 month. Catherine Trevino agrees to follow up with our clinic in 2 weeks.  Obesity Catherine Trevino is currently in the action stage of change. As such, her goal is to continue with weight loss efforts She has agreed to follow the Category 3 plan Catherine Trevino has been instructed to work up to a goal of 150 minutes of combined cardio and strengthening exercise per week for weight loss and overall health benefits. We discussed the following Behavioral Modification Strategies today: work on meal planning and easy cooking plans and no skipping meals   Catherine Trevino has agreed to follow up with our clinic in 2 weeks. She was informed of the importance of frequent follow up visits to maximize her success with intensive lifestyle modifications for her multiple health conditions.   OBESITY BEHAVIORAL INTERVENTION VISIT  Today's visit was # 13   Starting weight: 238 lbs Starting date: 04/15/17 Today's weight : 236 lbs   Today's date: 11/10/2017 Total lbs lost to date: 2    ASK: We discussed the diagnosis of obesity with Catherine Trevino today and Catherine Trevino agreed to give [place] permission to discuss obesity behavioral modification therapy today.  ASSESS: Catherine Trevino has the diagnosis of obesity and her BMI today is 40.49 Catherine Trevino is in the action stage of change   ADVISE: Catherine Trevino was educated on the multiple health risks of obesity as well as the benefit of weight loss to improve her health. She was advised of the need for long term treatment and the importance of lifestyle modifications.  AGREE: Multiple dietary modification options and treatment options were discussed and  Parisha agreed to the above obesity treatment plan.  Wilhemena Durie, am acting as transcriptionist for Abby Potash, PA-C I, Abby Potash, PA-C have reviewed above note and agree with its content

## 2017-11-19 ENCOUNTER — Other Ambulatory Visit: Payer: Self-pay

## 2017-11-19 ENCOUNTER — Ambulatory Visit: Payer: BC Managed Care – PPO | Admitting: Physician Assistant

## 2017-11-19 ENCOUNTER — Encounter: Payer: Self-pay | Admitting: Physician Assistant

## 2017-11-19 DIAGNOSIS — Z23 Encounter for immunization: Secondary | ICD-10-CM | POA: Diagnosis not present

## 2017-11-19 DIAGNOSIS — I10 Essential (primary) hypertension: Secondary | ICD-10-CM | POA: Diagnosis not present

## 2017-11-19 MED ORDER — LISINOPRIL-HYDROCHLOROTHIAZIDE 10-12.5 MG PO TABS: ORAL_TABLET | ORAL | 3 refills | Status: DC

## 2017-11-19 NOTE — Patient Instructions (Addendum)
   Come back for your annual exam in 6 months.   If you have lab work done today you will be contacted with your lab results within the next 2 weeks.  If you have not heard from [place] then please contact us. The fastest way to get your results is to register for My Chart.  Thank you for coming in today. I hope you feel we met your needs.  Feel free to call PCP if you have any questions or further requests.  Please consider signing up for MyChart if you do not already have it, as this is a great way to communicate with me.  Best,  Whitney McVey, PA-C  IF you received an x-ray today, you will receive an invoice from [facility]. Please contact [facility] at [phone] with questions or concerns regarding your invoice.   IF you received labwork today, you will receive an invoice from [facility]. Please contact [facility] at [phone] with questions or concerns regarding your invoice.   Our billing staff will not be able to assist you with questions regarding bills from these companies.  You will be contacted with the lab results as soon as they are available. The fastest way to get your results is to activate your My Chart account. Instructions are located on the last page of this paperwork. If you have not heard from [place] regarding the results in 2 weeks, please contact this office.

## 2017-11-19 NOTE — Progress Notes (Signed)
Catherine Trevino  MRN: [id number] DOB: Aug 06, 1960  PCP: Patient, No Pcp Per  Subjective:  Pt is a 57 year old female who presents to clinic for HTN f/u.   Currently seeing [facility] x 6 months. Q biweekly. Doing great. Goal to lose 100lbs total.  Labs done at that office: Vitamin D, lipids, A1C and CMP. Last labs 08/05/2017  HTN - controlled with Prinzide 10-12.5mg  qd. Blood pressure today is 134/83. working on weight loss.  Denies lightheadedness, dizziness, chronic headache, double vision, chest pain, shortness of breath, heart racing, palpitations, nausea, vomiting, abdominal pain, hematuria, lower leg swelling.   Last annual 01/2017. Has appt for MM Nov 15  Lab Results  Component Value Date   CHOL 144 08/05/2017   CHOL 153 04/15/2017   CHOL 140 02/07/2017   Lab Results  Component Value Date   HDL 51 08/05/2017   HDL 50 04/15/2017   HDL 40 02/07/2017   Lab Results  Component Value Date   LDLCALC 65 08/05/2017   LDLCALC 68 04/15/2017   LDLCALC 55 02/07/2017   Lab Results  Component Value Date   TRIG 140 08/05/2017   TRIG 177 (H) 04/15/2017   TRIG 224 (H) 02/07/2017   Lab Results  Component Value Date   CHOLHDL 3.5 02/07/2017   CHOLHDL 3.4 01/16/2016   CHOLHDL 3.5 01/31/2015   No results found for: LDLDIRECT  Review of Systems  Respiratory: Negative for cough and shortness of breath.   Cardiovascular: Negative for chest pain, palpitations and leg swelling.  Neurological: Negative for dizziness, light-headedness and headaches.    Patient Active Problem List   Diagnosis Date Noted  . Vitamin D deficiency 06/24/2017  . Shortness of breath on exertion 04/15/2017  . Other fatigue 04/15/2017  . Essential hypertension 04/15/2017  . Metabolic syndrome 35/45/6256  . Hypertriglyceridemia 02/20/2016  . Elevated liver enzymes 02/20/2016  . Rosacea 01/31/2015  . Lateral epicondylitis of left elbow 01/31/2015  . Hx of adenomatous colonic  polyps 12/29/2014  . Prediabetes 10/29/2014  . BMI 39.0-39.9,adult 10/25/2014  . HTN (hypertension) 07/03/2012  . GERD (gastroesophageal reflux disease) 07/03/2012    Current Outpatient Medications on File Prior to Visit  Medication Sig Dispense Refill  . buPROPion (WELLBUTRIN SR) 150 MG 12 hr tablet Take 1 tablet (150 mg total) by mouth daily. 30 tablet 0  . lisinopril-hydrochlorothiazide (PRINZIDE,ZESTORETIC) 10-12.5 MG tablet TAKE 1 TABLET DAILY. 30 tablet 0  . metFORMIN (GLUCOPHAGE) 500 MG tablet Take 1 tablet (500 mg total) by mouth daily with breakfast. 30 tablet 0  . Multiple Vitamins-Minerals (MULTIVITAMIN WOMEN 50+ PO) Take 1 tablet by mouth daily.    Marland Kitchen omega-3 acid ethyl esters (LOVAZA) 1 g capsule Take by mouth daily.    Marland Kitchen OVER THE COUNTER MEDICATION Take 1 capsule by mouth daily. Tumeric/Curcumin 500 mg taking     . polyethylene glycol powder (GLYCOLAX/MIRALAX) powder Take 17 g by mouth daily. Take every 12 hours 3350 g 0  . topiramate (TOPAMAX) 100 MG tablet Take 1 tablet (100 mg total) by mouth daily. 90 tablet 3  . Vitamin D, Ergocalciferol, (DRISDOL) 50000 units CAPS capsule Take 1 capsule (50,000 Units total) by mouth every 7 (seven) days. 4 capsule 0   No current facility-administered medications on file prior to visit.     No Known Allergies   Objective:  BP 134/83 (BP Location: Right Arm, Patient Position: Sitting, Cuff Size: Large)   Pulse 84   Temp 98 F (36.7 C) (  Oral)   Resp 14   Ht 5\' 4"  (1.626 m)   Wt 241 lb 6.4 oz (109.5 kg)   SpO2 100%   BMI 41.44 kg/m   Physical Exam  Constitutional: She is oriented to person, place, and time. No distress.  Cardiovascular: Normal rate, regular rhythm and normal heart sounds.  Neurological: She is alert and oriented to person, place, and time.  Skin: Skin is warm and dry.  Psychiatric: Judgment normal.  Vitals reviewed.   Assessment and Plan :  1. Essential hypertension - pt presents for f/u HTN. She is doing  great. Blood pressure today is 134/83. Okay to con't current dose. She sees [facility] biweekly - labs done during those visits. Lipids are improving. Her goal is to lose 100lbs. MM appt in a few weeks. Colonoscopy due 12/2019. RTC in 6-12 months for annual exam.   - lisinopril-hydrochlorothiazide (PRINZIDE,ZESTORETIC) 10-12.5 MG tablet; TAKE 1 TABLET DAILY.  Dispense: 90 tablet; Refill: 3   Whitney Mariame Rybolt, PA-C  [facility] 11/19/2017 4:34 PM  Please note: Portions of this report may have been transcribed using dragon voice recognition software. Every effort was made to ensure accuracy; however, inadvertent computerized transcription errors may be present.

## 2017-11-26 ENCOUNTER — Ambulatory Visit (INDEPENDENT_AMBULATORY_CARE_PROVIDER_SITE_OTHER): Payer: BC Managed Care – PPO | Admitting: Physician Assistant

## 2017-11-26 VITALS — BP 125/84 | HR 82 | Temp 97.7°F | Ht 64.0 in | Wt 239.0 lb

## 2017-11-26 DIAGNOSIS — E782 Mixed hyperlipidemia: Secondary | ICD-10-CM | POA: Diagnosis not present

## 2017-11-26 DIAGNOSIS — Z6841 Body Mass Index (BMI) 40.0 and over, adult: Secondary | ICD-10-CM

## 2017-11-26 DIAGNOSIS — E559 Vitamin D deficiency, unspecified: Secondary | ICD-10-CM | POA: Diagnosis not present

## 2017-11-26 DIAGNOSIS — Z9189 Other specified personal risk factors, not elsewhere classified: Secondary | ICD-10-CM

## 2017-11-26 DIAGNOSIS — R7303 Prediabetes: Secondary | ICD-10-CM

## 2017-11-27 LAB — HEMOGLOBIN A1C
Est. average glucose Bld gHb Est-mCnc: 111 mg/dL
Hgb A1c MFr Bld: 5.5 % (ref 4.8–5.6)

## 2017-11-27 LAB — COMPREHENSIVE METABOLIC PANEL
A/G RATIO: 1.6 (ref 1.2–2.2)
ALBUMIN: 4.2 g/dL (ref 3.5–5.5)
ALT: 32 IU/L (ref 0–32)
AST: 16 IU/L (ref 0–40)
Alkaline Phosphatase: 87 IU/L (ref 39–117)
BILIRUBIN TOTAL: 0.4 mg/dL (ref 0.0–1.2)
BUN / CREAT RATIO: 18 (ref 9–23)
BUN: 21 mg/dL (ref 6–24)
CHLORIDE: 106 mmol/L (ref 96–106)
CO2: 21 mmol/L (ref 20–29)
Calcium: 9.2 mg/dL (ref 8.7–10.2)
Creatinine, Ser: 1.15 mg/dL — ABNORMAL HIGH (ref 0.57–1.00)
GFR calc non Af Amer: 53 mL/min/{1.73_m2} — ABNORMAL LOW (ref >59)
GFR, EST AFRICAN AMERICAN: 61 mL/min/{1.73_m2} (ref >59)
GLUCOSE: 117 mg/dL — AB (ref 65–99)
Globulin, Total: 2.6 g/dL (ref 1.5–4.5)
Potassium: 3.8 mmol/L (ref 3.5–5.2)
Sodium: 141 mmol/L (ref 134–144)
TOTAL PROTEIN: 6.8 g/dL (ref 6.0–8.5)

## 2017-11-27 LAB — LIPID PANEL
CHOLESTEROL TOTAL: 162 mg/dL (ref 100–199)
Chol/HDL Ratio: 3.5 ratio (ref 0.0–4.4)
HDL: 46 mg/dL (ref >39)
LDL CALC: 83 mg/dL (ref 0–99)
Triglycerides: 165 mg/dL — ABNORMAL HIGH (ref 0–149)
VLDL Cholesterol Cal: 33 mg/dL (ref 5–40)

## 2017-11-27 LAB — INSULIN, RANDOM: INSULIN: 22.8 u[IU]/mL (ref 2.6–24.9)

## 2017-11-27 LAB — VITAMIN D 25 HYDROXY (VIT D DEFICIENCY, FRACTURES): Vit D, 25-Hydroxy: 37.1 ng/mL (ref 30.0–100.0)

## 2017-11-27 NOTE — Progress Notes (Signed)
Office: [phone]  /  Fax: [phone]   HPI:   Chief Complaint: OBESITY Catherine Trevino is here to discuss her progress with her obesity treatment plan. She is on the Category 3 plan and is following her eating plan approximately 50 % of the time. She states she is walking for 45 minutes 5 times per week. Catherine Trevino reports that she was in [place] and had a difficult time following the plan. She is ready to get back on track.  Her weight is 239 lb (108.4 kg) today and has gained 3 pounds since her last visit. She has lost 0 lbs since starting treatment with [place].  Pre-Diabetes Catherine Trevino has a diagnosis of pre-diabetes based on her elevated Hgb A1c and was informed this puts her at greater risk of developing diabetes. Sheis on metformin and denies nausea, vomiting, or diarrhea. She continues to work on diet and exercise to decrease risk of diabetes. She denies polyphagia or hypoglycemia.  Hyperlipidemia Catherine Trevino has hyperlipidemia and has been trying to improve her cholesterol levels with intensive lifestyle modification including a low saturated fat diet, exercise and weight loss. She is not on medications and denies any chest pain, claudication or myalgias.  At risk for cardiovascular disease Catherine Trevino is at a higher than average risk for cardiovascular disease due to obesity and hyperlipidemia. She currently denies any chest pain.  Vitamin D Deficiency Catherine Trevino has a diagnosis of vitamin D deficiency. She is on prescription Vit D and denies nausea, vomiting or muscle weakness.  ALLERGIES: No Known Allergies  MEDICATIONS: Current Outpatient Medications on File Prior to Visit  Medication Sig Dispense Refill  . buPROPion (WELLBUTRIN SR) 150 MG 12 hr tablet Take 1 tablet (150 mg total) by mouth daily. 30 tablet 0  . lisinopril-hydrochlorothiazide (PRINZIDE,ZESTORETIC) 10-12.5 MG tablet TAKE 1 TABLET DAILY. 90 tablet 3  . metFORMIN (GLUCOPHAGE) 500 MG tablet Take 1 tablet (500 mg total) by mouth  daily with breakfast. 30 tablet 0  . Multiple Vitamins-Minerals (MULTIVITAMIN WOMEN 50+ PO) Take 1 tablet by mouth daily.    Marland Kitchen omega-3 acid ethyl esters (LOVAZA) 1 g capsule Take by mouth daily.    Marland Kitchen OVER THE COUNTER MEDICATION Take 1 capsule by mouth daily. Tumeric/Curcumin 500 mg taking     . polyethylene glycol powder (GLYCOLAX/MIRALAX) powder Take 17 g by mouth daily. Take every 12 hours 3350 g 0  . topiramate (TOPAMAX) 100 MG tablet Take 1 tablet (100 mg total) by mouth daily. 90 tablet 3  . Vitamin D, Ergocalciferol, (DRISDOL) 50000 units CAPS capsule Take 1 capsule (50,000 Units total) by mouth every 7 (seven) days. 4 capsule 0   No current facility-administered medications on file prior to visit.     PAST MEDICAL HISTORY: Past Medical History:  Diagnosis Date  . Anemia   . 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 Diverticulitis   . GERD (gastroesophageal reflux disease)   . Hx of adenomatous colonic polyps 12/29/2014  . Hypertension   . Obesity   . OSA (obstructive sleep apnea) 01/06/2016   Mild. CPAP discontinued 12/2016 following significant weight loss.   . OSA on CPAP 01/06/2016    PAST SURGICAL HISTORY: Past Surgical History:  Procedure Laterality Date  . ABDOMINAL HYSTERECTOMY    . COLONOSCOPY    . TUBAL LIGATION      SOCIAL HISTORY:  Social History   Tobacco Use  . Smoking status: Never Smoker  . Smokeless tobacco: Never Used  Substance Use Topics  . Alcohol use: No    Alcohol/week: 0.0 standard drinks  . Drug use: No    FAMILY HISTORY: Family History  Problem Relation Age of Onset  . Hypertension Father   . Emphysema Maternal Grandmother   . Cancer Maternal Grandfather   . Hypertension Paternal Grandmother   . Cancer Paternal Grandmother   . Diabetes Paternal Grandmother   .  Hyperlipidemia Paternal Grandmother   . Hypertension Paternal Grandfather   . Diabetes Paternal Grandfather   . Hyperlipidemia Paternal Grandfather   . Colon cancer Neg Hx     ROS: Review of Systems  Constitutional: Negative for weight loss.  Cardiovascular: Negative for chest pain and claudication.  Gastrointestinal: Negative for diarrhea, nausea and vomiting.  Musculoskeletal: Negative for myalgias.       Negative muscle weakness  Endo/Heme/Allergies:       Negative polyphagia Negative hypoglycemia    PHYSICAL EXAM: Blood pressure 125/84, pulse 82, temperature 97.7 F (36.5 C), temperature source Oral, height 5\' 4"  (1.626 m), weight 239 lb (108.4 kg), SpO2 95 %. Body mass index is 41.02 kg/m. Physical Exam  Constitutional: She is oriented to person, place, and time. She appears well-developed and well-nourished.  Cardiovascular: Normal rate.  Pulmonary/Chest: Effort normal.  Musculoskeletal: Normal range of motion.  Neurological: She is oriented to person, place, and time.  Skin: Skin is warm and dry.  Psychiatric: She has a normal mood and affect. Her behavior is normal.  Vitals reviewed.   RECENT LABS AND TESTS: BMET    Component Value Date/Time   NA 141 11/26/2017 0838   K 3.8 11/26/2017 0838   CL 106 11/26/2017 0838   CO2 21 11/26/2017 0838   GLUCOSE 117 (H) 11/26/2017 0838   GLUCOSE 138 (H) 03/10/2016 0919   BUN 21 11/26/2017 0838   CREATININE 1.15 (H) 11/26/2017 0838   CREATININE 0.86 01/31/2015 1129   CALCIUM 9.2 11/26/2017 0838   GFRNONAA 53 (L) 11/26/2017 0838   GFRAA 61 11/26/2017 0838   Lab Results  Component Value Date   HGBA1C 5.5 11/26/2017   HGBA1C 5.7 (H) 08/05/2017   HGBA1C 5.7 (H) 04/15/2017   HGBA1C 5.6 02/07/2017   HGBA1C 5.9 (H) 01/16/2016   Lab Results  Component Value Date   INSULIN 22.8 11/26/2017   INSULIN 19.3 08/05/2017   INSULIN 18.2 04/15/2017   CBC    Component Value Date/Time   WBC 7.2 04/15/2017 0932   WBC 12.7  (H) 03/10/2016 0909   RBC 4.79 04/15/2017 0932   RBC 4.80 03/10/2016 0909   HGB 14.2 04/15/2017 0932   HCT 41.8 04/15/2017 0932   PLT 223 02/07/2017 1157   MCV 87 04/15/2017 0932   MCH 29.6 04/15/2017 0932   MCH 29.2 03/10/2016 0909   MCHC 34.0 04/15/2017 0932   MCHC 34.6 03/10/2016 0909   RDW 13.9 04/15/2017 0932   LYMPHSABS 2.5 04/15/2017 0932   MONOABS 1.0 03/10/2016 0909   EOSABS 0.1 04/15/2017 0932   BASOSABS 0.0 04/15/2017 0932   Iron/TIBC/Ferritin/ %Sat    Component Value Date/Time   FERRITIN 16 06/29/2009   Lipid Panel     Component Value Date/Time   CHOL 162 11/26/2017 0838   TRIG 165 (H) 11/26/2017 0838   HDL 46 11/26/2017 0838   CHOLHDL 3.5 11/26/2017 0838   CHOLHDL 3.5 01/31/2015 1129   VLDL 31 (H) 01/31/2015 1129  LDLCALC 83 11/26/2017 0838   Hepatic Function Panel     Component Value Date/Time   PROT 6.8 11/26/2017 0838   ALBUMIN 4.2 11/26/2017 0838   AST 16 11/26/2017 0838   ALT 32 11/26/2017 0838   ALKPHOS 87 11/26/2017 0838   BILITOT 0.4 11/26/2017 0838   BILIDIR 0.2 03/10/2016 0909   IBILI 0.7 03/10/2016 0909      Component Value Date/Time   TSH 2.430 04/15/2017 0932   TSH 2.710 02/07/2017 1157   TSH 3.310 01/16/2016 1141   Results for KATELINE, KINKADE "KIM" (MRN [id number]) as of 11/27/2017 10:59  Ref. Range 08/05/2017 08:04  Vitamin D, 25-Hydroxy Latest Ref Range: 30.0 - 100.0 ng/mL 33.9   ASSESSMENT AND PLAN: Prediabetes - Plan: Comprehensive metabolic panel, Hemoglobin A1c, Insulin, random  Mixed hyperlipidemia - Plan: Lipid panel  Vitamin D deficiency - Plan: VITAMIN D 25 Hydroxy (Vit-D Deficiency, Fractures)  At risk for heart disease  Class 3 severe obesity with serious comorbidity and body mass index (BMI) of 40.0 to 44.9 in adult, unspecified obesity type ([place])  PLAN:  Pre-Diabetes Zuma will continue to work on weight loss, diet, exercise, and decreasing simple carbohydrates in her diet to help decrease the risk of  diabetes. We dicussed metformin including benefits and risks. She was informed that eating too many simple carbohydrates or too many calories at one sitting increases the likelihood of GI side effects. Maribell agrees to continue taking metformin and we will check labs today. Catherine Trevino agrees to follow up with our clinic in 2 to 3 weeks as directed to monitor her progress.  Hyperlipidemia Catherine Trevino was informed of the American Heart Association Guidelines emphasizing intensive lifestyle modifications as the first line treatment for hyperlipidemia. We discussed many lifestyle modifications today in depth, and Catherine Trevino will continue to work on decreasing saturated fats such as fatty red meat, butter and many fried foods. She will also increase vegetables and lean protein in her diet and continue to work on diet, exercise, and weight loss efforts. We will check labs and Catherine Trevino agrees to follow up with our clinic in 2 to 3 weeks.  Cardiovascular risk counselling Catherine Trevino was given extended (15 minutes) coronary artery disease prevention counseling today. She is 57 y.o. female and has risk factors for heart disease including obesity and hyperlipidemia. We discussed intensive lifestyle modifications today with an emphasis on specific weight loss instructions and strategies. Pt was also informed of the importance of increasing exercise and decreasing saturated fats to help prevent heart disease.  Vitamin D Deficiency Catherine Trevino was informed that low vitamin D levels contributes to fatigue and are associated with obesity, breast, and colon cancer. Catherine Trevino agrees to continue taking prescription Vit D @50 ,000 IU every week and will follow up for routine testing of vitamin D, at least 2-3 times per year. She was informed of the risk of over-replacement of vitamin D and agrees to not increase her dose unless she discusses this with [place] first. We will check labs and Chery agrees to follow up with our clinic in 2 to 3  weeks.  Obesity Catherine Trevino is currently in the action stage of change. As such, her goal is to continue with weight loss efforts She has agreed to follow the Category 3 plan Catherine Trevino has been instructed to work up to a goal of 150 minutes of combined cardio and strengthening exercise per week for weight loss and overall health benefits. We discussed the following Behavioral Modification Strategies today: increasing lean protein intake and  work on [employer] and easy [place] has agreed to follow up with our clinic in 2 to 3 weeks. She was informed of the importance of frequent follow up visits to maximize her success with intensive lifestyle modifications for her multiple health conditions.   OBESITY BEHAVIORAL INTERVENTION VISIT  Today's visit was # 14   Starting weight: 238 lbs Starting date: 04/15/17 Today's weight : 239 lbs Today's date: 11/26/2017 Total lbs lost to date: 0    ASK: We discussed the diagnosis of obesity with Catherine Trevino today and Catherine Trevino agreed to give [place] permission to discuss obesity behavioral modification therapy today.  ASSESS: Catherine Trevino has the diagnosis of obesity and her BMI today is 6 Catherine Trevino is in the action stage of change   ADVISE: Catherine Trevino was educated on the multiple health risks of obesity as well as the benefit of weight loss to improve her health. She was advised of the need for long term treatment and the importance of lifestyle modifications.  AGREE: Multiple dietary modification options and treatment options were discussed and  Catherine Trevino agreed to the above obesity treatment plan.  Catherine Trevino, am acting as transcriptionist for Abby Potash, PA-C I, Abby Potash, PA-C have reviewed above note and agree with its content

## 2017-11-28 ENCOUNTER — Ambulatory Visit
Admission: RE | Admit: 2017-11-28 | Discharge: 2017-11-28 | Disposition: A | Discharge status: Home / Self Care | Payer: BC Managed Care – PPO | Source: Ambulatory Visit | Attending: Physician Assistant | Admitting: Physician Assistant

## 2017-11-28 DIAGNOSIS — Z1231 Encounter for screening mammogram for malignant neoplasm of breast: Secondary | ICD-10-CM

## 2017-11-30 ENCOUNTER — Encounter (INDEPENDENT_AMBULATORY_CARE_PROVIDER_SITE_OTHER): Payer: Self-pay | Admitting: Physician Assistant

## 2017-12-01 ENCOUNTER — Encounter: Payer: Self-pay | Admitting: Physician Assistant

## 2017-12-15 ENCOUNTER — Ambulatory Visit (INDEPENDENT_AMBULATORY_CARE_PROVIDER_SITE_OTHER): Payer: BC Managed Care – PPO | Admitting: Physician Assistant

## 2017-12-15 VITALS — BP 131/81 | HR 81 | Ht 64.0 in | Wt 234.0 lb

## 2017-12-15 DIAGNOSIS — E559 Vitamin D deficiency, unspecified: Secondary | ICD-10-CM | POA: Diagnosis not present

## 2017-12-15 DIAGNOSIS — Z9189 Other specified personal risk factors, not elsewhere classified: Secondary | ICD-10-CM

## 2017-12-15 DIAGNOSIS — F3289 Other specified depressive episodes: Secondary | ICD-10-CM | POA: Diagnosis not present

## 2017-12-15 DIAGNOSIS — R7303 Prediabetes: Secondary | ICD-10-CM | POA: Diagnosis not present

## 2017-12-15 DIAGNOSIS — Z6841 Body Mass Index (BMI) 40.0 and over, adult: Secondary | ICD-10-CM

## 2017-12-15 MED ORDER — VITAMIN D (ERGOCALCIFEROL) 1.25 MG (50000 UNIT) PO CAPS: 50000.0000 [IU] | ORAL_CAPSULE | ORAL | 0 refills | Status: DC

## 2017-12-15 MED ORDER — BUPROPION HCL ER (SR) 150 MG PO TB12: 150.0000 mg | ORAL_TABLET | Freq: Every day | ORAL | 0 refills | Status: DC

## 2017-12-15 MED ORDER — METFORMIN HCL 500 MG PO TABS: 500.0000 mg | ORAL_TABLET | Freq: Every day | ORAL | 0 refills | Status: DC

## 2017-12-17 NOTE — Progress Notes (Signed)
Office: [phone]  /  Fax: [phone]   HPI:   Chief Complaint: OBESITY Catherine Trevino is here to discuss her progress with her obesity treatment plan. She is on the Category 3 plan and is following her eating plan approximately 75 % of the time. She states she is walking for 30 minutes 3-4 times per week. Catherine Trevino did very well with weight loss. She reports that she was able to portion control her meal for Thanksgiving.  Her weight is 234 lb (106.1 kg) today and has had a weight loss of 5 pounds over a period of 2 to 3 weeks since her last visit. She has lost 4 lbs since starting treatment with [place].  Vitamin D Deficiency Catherine Trevino has a diagnosis of vitamin D deficiency. She is currently taking prescription Vit D and denies nausea, vomiting or muscle weakness.  Pre-Diabetes Catherine Trevino has a diagnosis of pre-diabetes based on her elevated Hgb A1c, most recent A1c was 5.5 and was informed this puts her at greater risk of developing diabetes. She is on metformin and denies nausea, vomiting, or diarrhea. She continues to work on diet and exercise to decrease risk of diabetes. She denies hypoglycemia.  At risk for diabetes Catherine Trevino is at higher than average risk for developing diabetes due to her obesity and pre-diabetes. She currently denies polyuria or polydipsia.  Depression with emotional eating behaviors Catherine Trevino denies cravings and her blood pressure is normal. Catherine Trevino struggles with emotional eating and using food for comfort to the extent that it is negatively impacting her health. She often snacks when she is not hungry. Catherine Trevino sometimes feels she is out of control and then feels guilty that she made poor food choices. She has been working on behavior modification techniques to help reduce her emotional eating and has been somewhat successful. She shows no sign of suicidal or homicidal ideations.  Depression screen Catherine Trevino 2/9 04/15/2017 02/07/2017 01/16/2016 10/25/2014  Decreased Interest 1 0 0  0  Down, Depressed, Hopeless 1 0 0 0  PHQ - 2 Score 2 0 0 0  Altered sleeping 1 - - -  Tired, decreased energy 1 - - -  Change in appetite 2 - - -  Feeling bad or failure about yourself  1 - - -  Trouble concentrating 1 - - -  Moving slowly or fidgety/restless 1 - - -  Suicidal thoughts 0 - - -  PHQ-9 Score 9 - - -  Difficult doing work/chores Not difficult at all - - -    ALLERGIES: No Known Allergies  MEDICATIONS: Current Outpatient Medications on File Prior to Visit  Medication Sig Dispense Refill  . lisinopril-hydrochlorothiazide (PRINZIDE,ZESTORETIC) 10-12.5 MG tablet TAKE 1 TABLET DAILY. 90 tablet 3  . Multiple Vitamins-Minerals (MULTIVITAMIN WOMEN 50+ PO) Take 1 tablet by mouth daily.    Marland Kitchen omega-3 acid ethyl esters (LOVAZA) 1 g capsule Take by mouth daily.    Marland Kitchen OVER THE COUNTER MEDICATION Take 1 capsule by mouth daily. Tumeric/Curcumin 500 mg taking     . polyethylene glycol powder (GLYCOLAX/MIRALAX) powder Take 17 g by mouth daily. Take every 12 hours 3350 g 0  . topiramate (TOPAMAX) 100 MG tablet Take 1 tablet (100 mg total) by mouth daily. 90 tablet 3   No current facility-administered medications on file prior to visit.     PAST MEDICAL HISTORY: Past Medical History:  Diagnosis Date  . Anemia   . ANGIODYSPLASIA, INTESTINE, WITHOUT HEMORRHAGE 08/16/2009   Qualifier: Diagnosis of  By: Carlean Purl MD, Tonna Boehringer  E   . Diabetes mellitus without complication ([place])    pt stated prediabetes, not on meds  . Diverticulitis   . GERD (gastroesophageal reflux disease)   . Hx of adenomatous colonic polyps 12/29/2014  . Hypertension   . Obesity   . OSA (obstructive sleep apnea) 01/06/2016   Mild. CPAP discontinued 12/2016 following significant weight loss.   . OSA on CPAP 01/06/2016    PAST SURGICAL HISTORY: Past Surgical History:  Procedure Laterality Date  . ABDOMINAL HYSTERECTOMY    . COLONOSCOPY    . TUBAL LIGATION      SOCIAL HISTORY: Social History   Tobacco  Use  . Smoking status: Never Smoker  . Smokeless tobacco: Never Used  Substance Use Topics  . Alcohol use: No    Alcohol/week: 0.0 standard drinks  . Drug use: No    FAMILY HISTORY: Family History  Problem Relation Age of Onset  . Hypertension Father   . Emphysema Maternal Grandmother   . Cancer Maternal Grandfather   . Hypertension Paternal Grandmother   . Cancer Paternal Grandmother   . Diabetes Paternal Grandmother   . Hyperlipidemia Paternal Grandmother   . Hypertension Paternal Grandfather   . Diabetes Paternal Grandfather   . Hyperlipidemia Paternal Grandfather   . Colon cancer Neg Hx     ROS: Review of Systems  Constitutional: Positive for weight loss.  Gastrointestinal: Negative for diarrhea, nausea and vomiting.  Genitourinary: Negative for frequency.  Musculoskeletal:       Negative muscle weakness  Endo/Heme/Allergies: Negative for polydipsia.    PHYSICAL EXAM: Blood pressure 131/81, pulse 81, height 5\' 4"  (1.626 m), weight 234 lb (106.1 kg), SpO2 96 %. Body mass index is 40.17 kg/m. Physical Exam  Constitutional: She is oriented to person, place, and time. She appears well-developed and well-nourished.  Cardiovascular: Normal rate.  Pulmonary/Chest: Effort normal.  Musculoskeletal: Normal range of motion.  Neurological: She is oriented to person, place, and time.  Skin: Skin is warm and dry.  Psychiatric: She has a normal mood and affect. Her behavior is normal.  Vitals reviewed.   RECENT LABS AND TESTS: BMET    Component Value Date/Time   NA 141 11/26/2017 0838   K 3.8 11/26/2017 0838   CL 106 11/26/2017 0838   CO2 21 11/26/2017 0838   GLUCOSE 117 (H) 11/26/2017 0838   GLUCOSE 138 (H) 03/10/2016 0919   BUN 21 11/26/2017 0838   CREATININE 1.15 (H) 11/26/2017 0838   CREATININE 0.86 01/31/2015 1129   CALCIUM 9.2 11/26/2017 0838   GFRNONAA 53 (L) 11/26/2017 0838   GFRAA 61 11/26/2017 0838   Lab Results  Component Value Date   HGBA1C 5.5  11/26/2017   HGBA1C 5.7 (H) 08/05/2017   HGBA1C 5.7 (H) 04/15/2017   HGBA1C 5.6 02/07/2017   HGBA1C 5.9 (H) 01/16/2016   Lab Results  Component Value Date   INSULIN 22.8 11/26/2017   INSULIN 19.3 08/05/2017   INSULIN 18.2 04/15/2017   CBC    Component Value Date/Time   WBC 7.2 04/15/2017 0932   WBC 12.7 (H) 03/10/2016 0909   RBC 4.79 04/15/2017 0932   RBC 4.80 03/10/2016 0909   HGB 14.2 04/15/2017 0932   HCT 41.8 04/15/2017 0932   PLT 223 02/07/2017 1157   MCV 87 04/15/2017 0932   MCH 29.6 04/15/2017 0932   MCH 29.2 03/10/2016 0909   MCHC 34.0 04/15/2017 0932   MCHC 34.6 03/10/2016 0909   RDW 13.9 04/15/2017 0932   LYMPHSABS 2.5 04/15/2017  0932   MONOABS 1.0 03/10/2016 0909   EOSABS 0.1 04/15/2017 0932   BASOSABS 0.0 04/15/2017 0932   Iron/TIBC/Ferritin/ %Sat    Component Value Date/Time   FERRITIN 16 06/29/2009   Lipid Panel     Component Value Date/Time   CHOL 162 11/26/2017 0838   TRIG 165 (H) 11/26/2017 0838   HDL 46 11/26/2017 0838   CHOLHDL 3.5 11/26/2017 0838   CHOLHDL 3.5 01/31/2015 1129   VLDL 31 (H) 01/31/2015 1129   LDLCALC 83 11/26/2017 0838   Hepatic Function Panel     Component Value Date/Time   PROT 6.8 11/26/2017 0838   ALBUMIN 4.2 11/26/2017 0838   AST 16 11/26/2017 0838   ALT 32 11/26/2017 0838   ALKPHOS 87 11/26/2017 0838   BILITOT 0.4 11/26/2017 0838   BILIDIR 0.2 03/10/2016 0909   IBILI 0.7 03/10/2016 0909      Component Value Date/Time   TSH 2.430 04/15/2017 0932   TSH 2.710 02/07/2017 1157   TSH 3.310 01/16/2016 1141  Results for RAEVIN, WIERENGA "KIM" (MRN [id number]) as of 12/17/2017 11:37  Ref. Range 11/26/2017 08:38  Vitamin D, 25-Hydroxy Latest Ref Range: 30.0 - 100.0 ng/mL 37.1    ASSESSMENT AND PLAN: Vitamin D deficiency - Plan: Vitamin D, Ergocalciferol, (DRISDOL) 1.25 MG (50000 UT) CAPS capsule  Prediabetes - Plan: metFORMIN (GLUCOPHAGE) 500 MG tablet  Other depression - with emotional eating  - Plan:  buPROPion (WELLBUTRIN SR) 150 MG 12 hr tablet  At risk for diabetes mellitus  Class 3 severe obesity with serious comorbidity and body mass index (BMI) of 40.0 to 44.9 in adult, unspecified obesity type ([place])  PLAN:  Vitamin D Deficiency Catherine Trevino was informed that low vitamin D levels contributes to fatigue and are associated with obesity, breast, and colon cancer. Catherine Trevino agrees to change prescription Vit D to 50,000 IU every 3 days #10 with no refills. She will follow up for routine testing of vitamin D, at least 2-3 times per year. She was informed of the risk of over-replacement of vitamin D and agrees to not increase her dose unless she discusses this with [place] first. Catherine Trevino agrees to follow up with our clinic in 2 to 3 weeks.  Pre-Diabetes Catherine Trevino will continue to work on weight loss, exercise, and decreasing simple carbohydrates in her diet to help decrease the risk of diabetes. We dicussed metformin including benefits and risks. She was informed that eating too many simple carbohydrates or too many calories at one sitting increases the likelihood of GI side effects. Catherine Trevino agrees to continue taking metformin 500 mg q AM #30 and we will refill for 1 month. Catherine Trevino agrees to follow up with our clinic in 2 to 3 weeks as directed to monitor her progress.  Diabetes risk counselling Catherine Trevino was given extended (15 minutes) diabetes prevention counseling today. She is 57 y.o. female and has risk factors for diabetes including obesity and pre-diabetes. We discussed intensive lifestyle modifications today with an emphasis on weight loss as well as increasing exercise and decreasing simple carbohydrates in her diet.  Depression with Emotional Eating Behaviors We discussed behavior modification techniques today to help Catherine Trevino deal with her emotional eating and depression. Catherine Trevino agrees to continue taking bupropion SR 150 mg qd #30 and we will refill for 1 month. Catherine Trevino agrees to follow up  with our clinic in 2 to 3 weeks.  Obesity Catherine Trevino is currently in the action stage of change. As such, her goal is to continue with weight loss  efforts She has agreed to follow the Category 3 plan Catherine Trevino has been instructed to work up to a goal of 150 minutes of combined cardio and strengthening exercise per week for weight loss and overall health benefits. We discussed the following Behavioral Modification Strategies today: work on meal planning and easy cooking plans and ways to avoid boredom eating   Catherine Trevino has agreed to follow up with our clinic in 2 to 3 weeks. She was informed of the importance of frequent follow up visits to maximize her success with intensive lifestyle modifications for her multiple health conditions.   OBESITY BEHAVIORAL INTERVENTION VISIT  Today's visit was # 15   Starting weight: 238 lbs Starting date: 04/15/17 Today's weight : 234 lbs  Today's date: 12/15/2017 Total lbs lost to date: 4    ASK: We discussed the diagnosis of obesity with Catherine Trevino today and Catherine Trevino agreed to give [place] permission to discuss obesity behavioral modification therapy today.  ASSESS: Catherine Trevino has the diagnosis of obesity and her BMI today is 40.15 Catherine Trevino is in the action stage of change   ADVISE: Catherine Trevino was educated on the multiple health risks of obesity as well as the benefit of weight loss to improve her health. She was advised of the need for long term treatment and the importance of lifestyle modifications.  AGREE: Multiple dietary modification options and treatment options were discussed and  Kam agreed to the above obesity treatment plan.  Wilhemena Durie, am acting as transcriptionist for Abby Potash, PA-C I, Abby Potash, PA-C have reviewed above note and agree with its content

## 2018-01-05 ENCOUNTER — Ambulatory Visit (INDEPENDENT_AMBULATORY_CARE_PROVIDER_SITE_OTHER): Payer: BC Managed Care – PPO | Admitting: Physician Assistant

## 2018-01-05 VITALS — BP 135/83 | HR 73 | Temp 97.6°F | Ht 64.0 in | Wt 236.0 lb

## 2018-01-05 DIAGNOSIS — Z9189 Other specified personal risk factors, not elsewhere classified: Secondary | ICD-10-CM | POA: Diagnosis not present

## 2018-01-05 DIAGNOSIS — E559 Vitamin D deficiency, unspecified: Secondary | ICD-10-CM

## 2018-01-05 DIAGNOSIS — R7303 Prediabetes: Secondary | ICD-10-CM

## 2018-01-05 DIAGNOSIS — F3289 Other specified depressive episodes: Secondary | ICD-10-CM | POA: Diagnosis not present

## 2018-01-05 DIAGNOSIS — Z6841 Body Mass Index (BMI) 40.0 and over, adult: Secondary | ICD-10-CM

## 2018-01-05 MED ORDER — METFORMIN HCL 500 MG PO TABS: 500.0000 mg | ORAL_TABLET | Freq: Every day | ORAL | 0 refills | Status: DC

## 2018-01-05 MED ORDER — VITAMIN D (ERGOCALCIFEROL) 1.25 MG (50000 UNIT) PO CAPS: 50000.0000 [IU] | ORAL_CAPSULE | ORAL | 0 refills | Status: DC

## 2018-01-05 MED ORDER — BUPROPION HCL ER (SR) 150 MG PO TB12: 150.0000 mg | ORAL_TABLET | Freq: Every day | ORAL | 0 refills | Status: DC

## 2018-01-05 NOTE — Progress Notes (Signed)
Office: [phone]  /  Fax: [phone]   HPI:   Chief Complaint: OBESITY Jamesia is here to discuss her progress with her obesity treatment plan. She is on the Category 3 plan daily and is following her eating plan approximately 90-95 % of the time. She states she is walking 30 minutes 3 times per week. Anaid reports following the plan closely and is discouraged today that she has not lost weight. She is not traveling for the holidays.   Her weight is 236 lb (107 kg) today and she has gained 2 lbs since her last visit. She has lost 2 lbs since starting treatment with [place].  Vitamin D deficiency Jakyria has a diagnosis of vitamin D deficiency. She is currently prescription vitamin D and denies nausea, vomiting or muscle weakness.  Pre-Diabetes Willma has a diagnosis of prediabetes based on her elevated Hgb A1c and was informed this puts her at greater risk of developing diabetes. She is taking metformin, she denies nausea vomiting and diarrhea. Fara continues to work on diet and exercise to decrease risk of diabetes. She denies hypoglycemia or polyphagia.  At risk for diabetes Crystalmarie is at higher than average risk for developing diabetes due to her obesity. She currently denies polyuria or polydipsia.  Depression with emotional eating behaviors Velva is struggling with emotional eating and using food for comfort to the extent that it is negatively impacting her health. She often snacks when she is not hungry. Cheril sometimes feels she is out of control and then feels guilty that she made poor food choices. She has been working on behavior modification techniques to help reduce her emotional eating and has been somewhat successful. Nhyla shows no sign of suicidal or homicidal ideations. She denies cravings. Her blood pressure is normal.    Depression screen [facility] 2/9 04/15/2017 02/07/2017 01/16/2016 10/25/2014  Decreased Interest 1 0 0 0  Down, Depressed, Hopeless 1 0 0 0   PHQ - 2 Score 2 0 0 0  Altered sleeping 1 - - -  Tired, decreased energy 1 - - -  Change in appetite 2 - - -  Feeling bad or failure about yourself  1 - - -  Trouble concentrating 1 - - -  Moving slowly or fidgety/restless 1 - - -  Suicidal thoughts 0 - - -  PHQ-9 Score 9 - - -  Difficult doing work/chores Not difficult at all - - -       ASSESSMENT AND PLAN:  Vitamin D deficiency - Plan: Vitamin D, Ergocalciferol, (DRISDOL) 1.25 MG (50000 UT) CAPS capsule  Prediabetes - Plan: metFORMIN (GLUCOPHAGE) 500 MG tablet  Other depression - with emotional eating - Plan: buPROPion (WELLBUTRIN SR) 150 MG 12 hr tablet  At risk for diabetes mellitus  Class 3 severe obesity with serious comorbidity and body mass index (BMI) of 40.0 to 44.9 in adult, unspecified obesity type ([facility])  Other depression - with emotional eating  - Plan: buPROPion (WELLBUTRIN SR) 150 MG 12 hr tablet  PLAN:  Vitamin D deficiency Saleah has a diagnosis of vitamin D deficiency. She agrees to continue taking prescription Vit D 1.25 MG (50000 UT) #10 every 3 days with no refills. She denies nausea, vomiting or muscle weakness. Melvenia agrees to follow up with our clinic in 3 weeks.  Pre-Diabetes Deadra has a diagnosis of prediabetes based on her elevated Hgb A1c and was informed this puts her at greater risk of developing diabetes. She agrees to continue taking metformin 500 mg  daily # 30, no refills with breakfast and continues to work on diet and exercise to decrease risk of diabetes. She denies nausea or hypoglycemia. Berklie agrees to follow up with our clinic in 3 weeks.  Diabetes risk counseling Hollyn was given extended (15 minutes) diabetes prevention counseling today. She is 57 y.o. female and has risk factors for diabetes including obesity. We discussed intensive lifestyle modifications today with an emphasis on weight loss as well as increasing exercise and decreasing simple carbohydrates in her  diet.  Depression with Emotional Eating Behaviors We discussed behavior modification techniques today to help Severa deal with her emotional eating and depression. She has agreed to take Wellbutrin SR 150 mg qd #30 with no refills. Breigh has agreed to follow up with our clinic in 3 weeks.  Obesity Rochelle is currently in the action stage of change. As such, her goal is to continue with weight loss efforts She has agreed to follow the Category 3 plan daily Lillah has been instructed to work up to a goal of 150 minutes of combined cardio and strengthening exercise per week for weight loss and overall health benefits. We discussed the following Behavioral Modification Strategies today: work on meal planning and easy cooking plans and holiday eating strategies  Tabbitha has agreed to follow up with our clinic in 3 weeks. She was informed of the importance of frequent follow up visits to maximize her success with intensive lifestyle modifications for her multiple health conditions.  ALLERGIES: No Known Allergies  MEDICATIONS: Current Outpatient Medications on File Prior to Visit  Medication Sig Dispense Refill  . lisinopril-hydrochlorothiazide (PRINZIDE,ZESTORETIC) 10-12.5 MG tablet TAKE 1 TABLET DAILY. 90 tablet 3  . Multiple Vitamins-Minerals (MULTIVITAMIN WOMEN 50+ PO) Take 1 tablet by mouth daily.    Marland Kitchen omega-3 acid ethyl esters (LOVAZA) 1 g capsule Take by mouth daily.    Marland Kitchen OVER THE COUNTER MEDICATION Take 1 capsule by mouth daily. Tumeric/Curcumin 500 mg taking     . polyethylene glycol powder (GLYCOLAX/MIRALAX) powder Take 17 g by mouth daily. Take every 12 hours 3350 g 0  . topiramate (TOPAMAX) 100 MG tablet Take 1 tablet (100 mg total) by mouth daily. 90 tablet 3   No current facility-administered medications on file prior to visit.     PAST MEDICAL HISTORY: Past Medical History:  Diagnosis Date  . Anemia   . ANGIODYSPLASIA, INTESTINE, WITHOUT HEMORRHAGE 08/16/2009    Qualifier: Diagnosis of  By: Carlean Purl MD, Dimas Millin Diabetes mellitus without complication ([place])    pt stated prediabetes, not on meds  . Diverticulitis   . GERD (gastroesophageal reflux disease)   . Hx of adenomatous colonic polyps 12/29/2014  . Hypertension   . Obesity   . OSA (obstructive sleep apnea) 01/06/2016   Mild. CPAP discontinued 12/2016 following significant weight loss.   . OSA on CPAP 01/06/2016    PAST SURGICAL HISTORY: Past Surgical History:  Procedure Laterality Date  . ABDOMINAL HYSTERECTOMY    . COLONOSCOPY    . TUBAL LIGATION      SOCIAL HISTORY: Social History   Tobacco Use  . Smoking status: Never Smoker  . Smokeless tobacco: Never Used  Substance Use Topics  . Alcohol use: No    Alcohol/week: 0.0 standard drinks  . Drug use: No    FAMILY HISTORY: Family History  Problem Relation Age of Onset  . Hypertension Father   . Emphysema Maternal Grandmother   . Cancer Maternal Grandfather   . Hypertension  Paternal Grandmother   . Cancer Paternal Grandmother   . Diabetes Paternal Grandmother   . Hyperlipidemia Paternal Grandmother   . Hypertension Paternal Grandfather   . Diabetes Paternal Grandfather   . Hyperlipidemia Paternal Grandfather   . Colon cancer Neg Hx     ROS: Review of Systems  Constitutional: Negative for weight loss.  Gastrointestinal: Negative for diarrhea, nausea and vomiting.  Musculoskeletal:       Negative for muscle weakness  Endo/Heme/Allergies:       Negative for polyphagia  Negative for hypoglycemia  Psychiatric/Behavioral: Positive for depression. Negative for suicidal ideas.    PHYSICAL EXAM: Blood pressure 135/83, pulse 73, temperature 97.6 F (36.4 C), temperature source Oral, height 5\' 4"  (1.626 m), weight 236 lb (107 kg), SpO2 96 %. Body mass index is 40.51 kg/m. Physical Exam Vitals signs reviewed.  Constitutional:      Appearance: Normal appearance. She is obese.  Cardiovascular:     Rate and  Rhythm: Normal rate.     Pulses: Normal pulses.  Pulmonary:     Effort: Pulmonary effort is normal.  Musculoskeletal: Normal range of motion.  Skin:    General: Skin is warm and dry.  Neurological:     Mental Status: She is alert and oriented to person, place, and time.  Psychiatric:        Mood and Affect: Mood normal.        Behavior: Behavior normal.     RECENT LABS AND TESTS: BMET    Component Value Date/Time   NA 141 11/26/2017 0838   K 3.8 11/26/2017 0838   CL 106 11/26/2017 0838   CO2 21 11/26/2017 0838   GLUCOSE 117 (H) 11/26/2017 0838   GLUCOSE 138 (H) 03/10/2016 0919   BUN 21 11/26/2017 0838   CREATININE 1.15 (H) 11/26/2017 0838   CREATININE 0.86 01/31/2015 1129   CALCIUM 9.2 11/26/2017 0838   GFRNONAA 53 (L) 11/26/2017 0838   GFRAA 61 11/26/2017 0838   Lab Results  Component Value Date   HGBA1C 5.5 11/26/2017   HGBA1C 5.7 (H) 08/05/2017   HGBA1C 5.7 (H) 04/15/2017   HGBA1C 5.6 02/07/2017   HGBA1C 5.9 (H) 01/16/2016   Lab Results  Component Value Date   INSULIN 22.8 11/26/2017   INSULIN 19.3 08/05/2017   INSULIN 18.2 04/15/2017   CBC    Component Value Date/Time   WBC 7.2 04/15/2017 0932   WBC 12.7 (H) 03/10/2016 0909   RBC 4.79 04/15/2017 0932   RBC 4.80 03/10/2016 0909   HGB 14.2 04/15/2017 0932   HCT 41.8 04/15/2017 0932   PLT 223 02/07/2017 1157   MCV 87 04/15/2017 0932   MCH 29.6 04/15/2017 0932   MCH 29.2 03/10/2016 0909   MCHC 34.0 04/15/2017 0932   MCHC 34.6 03/10/2016 0909   RDW 13.9 04/15/2017 0932   LYMPHSABS 2.5 04/15/2017 0932   MONOABS 1.0 03/10/2016 0909   EOSABS 0.1 04/15/2017 0932   BASOSABS 0.0 04/15/2017 0932   Iron/TIBC/Ferritin/ %Sat    Component Value Date/Time   FERRITIN 16 06/29/2009   Lipid Panel     Component Value Date/Time   CHOL 162 11/26/2017 0838   TRIG 165 (H) 11/26/2017 0838   HDL 46 11/26/2017 0838   CHOLHDL 3.5 11/26/2017 0838   CHOLHDL 3.5 01/31/2015 1129   VLDL 31 (H) 01/31/2015 1129    LDLCALC 83 11/26/2017 0838   Hepatic Function Panel     Component Value Date/Time   PROT 6.8 11/26/2017 1610  ALBUMIN 4.2 11/26/2017 0838   AST 16 11/26/2017 0838   ALT 32 11/26/2017 0838   ALKPHOS 87 11/26/2017 0838   BILITOT 0.4 11/26/2017 0838   BILIDIR 0.2 03/10/2016 0909   IBILI 0.7 03/10/2016 0909      Component Value Date/Time   TSH 2.430 04/15/2017 0932   TSH 2.710 02/07/2017 1157   TSH 3.310 01/16/2016 1141     Ref. Range 11/26/2017 08:38  Vitamin D, 25-Hydroxy Latest Ref Range: 30.0 - 100.0 ng/mL 37.1     OBESITY BEHAVIORAL INTERVENTION VISIT  Today's visit was # 16   Starting weight: 238 lbs Starting date: 04/15/17 Today's weight : 236 lbs Today's date: 01/05/2018 Total lbs lost to date: 2   ASK: We discussed the diagnosis of obesity with Donata Duff today and [place] agreed to give [place] permission to discuss obesity behavioral modification therapy today.  ASSESS: Shron has the diagnosis of obesity and her BMI today is 40.49 Darcee is in the action stage of change   ADVISE: Joia was educated on the multiple health risks of obesity as well as the benefit of weight loss to improve her health. She was advised of the need for long term treatment and the importance of lifestyle modifications to improve her current health and to decrease her risk of future health problems.  AGREE: Multiple dietary modification options and treatment options were discussed and  Natalina agreed to follow the recommendations documented in the above note.  ARRANGE: Durene was educated on the importance of frequent visits to treat obesity as outlined per CMS and USPSTF guidelines and agreed to schedule her next follow up appointment today.  I, Shaundra Fullam, am acting as Location manager for [employer], PA-C I, Abby Potash, PA-C have reviewed above note and agree with its content

## 2018-01-22 ENCOUNTER — Ambulatory Visit (INDEPENDENT_AMBULATORY_CARE_PROVIDER_SITE_OTHER): Payer: BC Managed Care – PPO | Admitting: Family Medicine

## 2018-01-22 ENCOUNTER — Encounter (INDEPENDENT_AMBULATORY_CARE_PROVIDER_SITE_OTHER): Payer: Self-pay

## 2018-01-26 ENCOUNTER — Ambulatory Visit (INDEPENDENT_AMBULATORY_CARE_PROVIDER_SITE_OTHER): Payer: BC Managed Care – PPO | Admitting: Bariatrics

## 2018-01-26 ENCOUNTER — Encounter (INDEPENDENT_AMBULATORY_CARE_PROVIDER_SITE_OTHER): Payer: Self-pay | Admitting: Bariatrics

## 2018-01-26 VITALS — BP 107/75 | HR 88 | Temp 98.0°F | Ht 64.0 in | Wt 235.0 lb

## 2018-01-26 DIAGNOSIS — Z6841 Body Mass Index (BMI) 40.0 and over, adult: Secondary | ICD-10-CM

## 2018-01-26 DIAGNOSIS — R7303 Prediabetes: Secondary | ICD-10-CM

## 2018-01-26 DIAGNOSIS — F3289 Other specified depressive episodes: Secondary | ICD-10-CM

## 2018-01-27 NOTE — Progress Notes (Signed)
Office: [phone]  /  Fax: [phone]   HPI:   Chief Complaint: OBESITY Catherine Trevino is here to discuss her progress with her obesity treatment plan. She is on  the Category 3 plan and is following her eating plan approximately 75 % of the time. She states she is walking 30 minutes 3 times per week. Catherine Trevino has struggled overall. She did not gain weight over the holidays. Her weight is 235 lb (106.6 kg) today and has had a weight loss of 1 pound over a period of 3 weeks since her last visit. She has lost 3 lbs since starting treatment with [place].  Pre-Diabetes Catherine Trevino has a diagnosis of prediabetes based on her elevated Hgb A1c and was informed this puts her at greater risk of developing diabetes. She is taking metformin currently and continues to work on diet and exercise to decrease risk of diabetes. She denies polyphagia.  Depression with emotional eating behaviors Catherine Trevino struggles with emotional eating and using food for comfort to the extent that it is negatively impacting her health. She often snacks when she is not hungry. Catherine Trevino sometimes feels she is out of control and then feels guilty that she made poor food choices. She is currently taking Bupropion. She has been working on behavior modification techniques to help reduce her emotional eating and has been somewhat successful. She shows no sign of suicidal or homicidal ideations.  Depression screen [facility] 2/9 04/15/2017 02/07/2017 01/16/2016 10/25/2014  Decreased Interest 1 0 0 0  Down, Depressed, Hopeless 1 0 0 0  PHQ - 2 Score 2 0 0 0  Altered sleeping 1 - - -  Tired, decreased energy 1 - - -  Change in appetite 2 - - -  Feeling bad or failure about yourself  1 - - -  Trouble concentrating 1 - - -  Moving slowly or fidgety/restless 1 - - -  Suicidal thoughts 0 - - -  PHQ-9 Score 9 - - -  Difficult doing work/chores Not difficult at all - - -     ASSESSMENT AND PLAN:  Prediabetes  Other depression - with emotional  eating  Class 3 severe obesity with serious comorbidity and body mass index (BMI) of 40.0 to 44.9 in adult, unspecified obesity type (Catherine Trevino)  PLAN:  Pre-Diabetes Catherine Trevino will continue to work on weight loss, exercise, and decreasing simple carbohydrates in her diet to help decrease the risk of diabetes. We dicussed metformin including benefits and risks. She was informed that eating too many simple carbohydrates or too many calories at one sitting increases the likelihood of GI side effects. [name] will continue metformin for now and a prescription was not written today. Catherine Trevino agreed to follow up with [place] as directed to monitor her progress.  Depression with Emotional Eating Behaviors We discussed behavior modification techniques today to help Catherine Trevino deal with her emotional eating and depression. She will continue Bupropion (Wellbutrin SR) 150 mg qd and follow up as directed.  I spent > than 50% of the 15 minute visit on counseling as documented in the note.  Obesity Catherine Trevino is currently in the action stage of change. As such, her goal is to continue with weight loss efforts She has agreed to follow the Category 3 plan Catherine Trevino has been instructed to work up to a goal of 150 minutes of combined cardio and strengthening exercise per week for weight loss and overall health benefits. We discussed the following Behavioral Modification Strategies today: increase H2O intake, keeping healthy foods in  the home, increasing leaner protein intake, decreasing simple carbohydrates, increasing vegetables and work on meal planning and easy cooking plans Catherine Trevino has increased her water intake and she will continue. Handouts for store bought seasonings and homemade seasonings were provided to patient today.  Catherine Trevino has agreed to follow up with our clinic in 2 weeks. She was informed of the importance of frequent follow up visits to maximize her success with intensive lifestyle modifications for her  multiple health conditions.  ALLERGIES: No Known Allergies  MEDICATIONS: Current Outpatient Medications on File Prior to Visit  Medication Sig Dispense Refill  . buPROPion (WELLBUTRIN SR) 150 MG 12 hr tablet Take 1 tablet (150 mg total) by mouth daily. 30 tablet 0  . lisinopril-hydrochlorothiazide (PRINZIDE,ZESTORETIC) 10-12.5 MG tablet TAKE 1 TABLET DAILY. 90 tablet 3  . metFORMIN (GLUCOPHAGE) 500 MG tablet Take 1 tablet (500 mg total) by mouth daily with breakfast. 30 tablet 0  . Multiple Vitamins-Minerals (MULTIVITAMIN WOMEN 50+ PO) Take 1 tablet by mouth daily.    Marland Kitchen omega-3 acid ethyl esters (LOVAZA) 1 g capsule Take by mouth daily.    Marland Kitchen OVER THE COUNTER MEDICATION Take 1 capsule by mouth daily. Tumeric/Curcumin 500 mg taking     . polyethylene glycol powder (GLYCOLAX/MIRALAX) powder Take 17 g by mouth daily. Take every 12 hours 3350 g 0  . topiramate (TOPAMAX) 100 MG tablet Take 1 tablet (100 mg total) by mouth daily. 90 tablet 3  . Vitamin D, Ergocalciferol, (DRISDOL) 1.25 MG (50000 UT) CAPS capsule Take 1 capsule (50,000 Units total) by mouth every 3 (three) days. 10 capsule 0   No current facility-administered medications on file prior to visit.     PAST MEDICAL HISTORY: Past Medical History:  Diagnosis Date  . Anemia   . ANGIODYSPLASIA, INTESTINE, WITHOUT HEMORRHAGE 08/16/2009   Qualifier: Diagnosis of  By: Carlean Purl MD, Dimas Millin Diabetes mellitus without complication ([place])    pt stated prediabetes, not on meds  . Diverticulitis   . GERD (gastroesophageal reflux disease)   . Hx of adenomatous colonic polyps 12/29/2014  . Hypertension   . Obesity   . OSA (obstructive sleep apnea) 01/06/2016   Mild. CPAP discontinued 12/2016 following significant weight loss.   . OSA on CPAP 01/06/2016    PAST SURGICAL HISTORY: Past Surgical History:  Procedure Laterality Date  . ABDOMINAL HYSTERECTOMY    . COLONOSCOPY    . TUBAL LIGATION      SOCIAL HISTORY: Social History     Tobacco Use  . Smoking status: Never Smoker  . Smokeless tobacco: Never Used  Substance Use Topics  . Alcohol use: No    Alcohol/week: 0.0 standard drinks  . Drug use: No    FAMILY HISTORY: Family History  Problem Relation Age of Onset  . Hypertension Father   . Emphysema Maternal Grandmother   . Cancer Maternal Grandfather   . Hypertension Paternal Grandmother   . Cancer Paternal Grandmother   . Diabetes Paternal Grandmother   . Hyperlipidemia Paternal Grandmother   . Hypertension Paternal Grandfather   . Diabetes Paternal Grandfather   . Hyperlipidemia Paternal Grandfather   . Colon cancer Neg Hx     ROS: Review of Systems  Constitutional: Positive for weight loss.  Endo/Heme/Allergies:       Negative for polyphagia  Psychiatric/Behavioral: Positive for depression. Negative for suicidal ideas.    PHYSICAL EXAM: Blood pressure 107/75, pulse 88, temperature 98 F (36.7 C), temperature source Oral, height 5\' 4"  (1.626  m), weight 235 lb (106.6 kg), SpO2 97 %. Body mass index is 40.34 kg/m. Physical Exam Vitals signs reviewed.  Constitutional:      Appearance: Normal appearance. She is well-developed. She is obese.  Cardiovascular:     Rate and Rhythm: Normal rate.  Pulmonary:     Effort: Pulmonary effort is normal.  Musculoskeletal: Normal range of motion.  Skin:    General: Skin is warm and dry.  Neurological:     Mental Status: She is alert and oriented to person, place, and time.  Psychiatric:        Mood and Affect: Mood normal.        Behavior: Behavior normal.        Thought Content: Thought content does not include homicidal or suicidal ideation.     RECENT LABS AND TESTS: BMET    Component Value Date/Time   NA 141 11/26/2017 0838   K 3.8 11/26/2017 0838   CL 106 11/26/2017 0838   CO2 21 11/26/2017 0838   GLUCOSE 117 (H) 11/26/2017 0838   GLUCOSE 138 (H) 03/10/2016 0919   BUN 21 11/26/2017 0838   CREATININE 1.15 (H) 11/26/2017 0838    CREATININE 0.86 01/31/2015 1129   CALCIUM 9.2 11/26/2017 0838   GFRNONAA 53 (L) 11/26/2017 0838   GFRAA 61 11/26/2017 0838   Lab Results  Component Value Date   HGBA1C 5.5 11/26/2017   HGBA1C 5.7 (H) 08/05/2017   HGBA1C 5.7 (H) 04/15/2017   HGBA1C 5.6 02/07/2017   HGBA1C 5.9 (H) 01/16/2016   Lab Results  Component Value Date   INSULIN 22.8 11/26/2017   INSULIN 19.3 08/05/2017   INSULIN 18.2 04/15/2017   CBC    Component Value Date/Time   WBC 7.2 04/15/2017 0932   WBC 12.7 (H) 03/10/2016 0909   RBC 4.79 04/15/2017 0932   RBC 4.80 03/10/2016 0909   HGB 14.2 04/15/2017 0932   HCT 41.8 04/15/2017 0932   PLT 223 02/07/2017 1157   MCV 87 04/15/2017 0932   MCH 29.6 04/15/2017 0932   MCH 29.2 03/10/2016 0909   MCHC 34.0 04/15/2017 0932   MCHC 34.6 03/10/2016 0909   RDW 13.9 04/15/2017 0932   LYMPHSABS 2.5 04/15/2017 0932   MONOABS 1.0 03/10/2016 0909   EOSABS 0.1 04/15/2017 0932   BASOSABS 0.0 04/15/2017 0932   Iron/TIBC/Ferritin/ %Sat    Component Value Date/Time   FERRITIN 16 06/29/2009   Lipid Panel     Component Value Date/Time   CHOL 162 11/26/2017 0838   TRIG 165 (H) 11/26/2017 0838   HDL 46 11/26/2017 0838   CHOLHDL 3.5 11/26/2017 0838   CHOLHDL 3.5 01/31/2015 1129   VLDL 31 (H) 01/31/2015 1129   LDLCALC 83 11/26/2017 0838   Hepatic Function Panel     Component Value Date/Time   PROT 6.8 11/26/2017 0838   ALBUMIN 4.2 11/26/2017 0838   AST 16 11/26/2017 0838   ALT 32 11/26/2017 0838   ALKPHOS 87 11/26/2017 0838   BILITOT 0.4 11/26/2017 0838   BILIDIR 0.2 03/10/2016 0909   IBILI 0.7 03/10/2016 0909      Component Value Date/Time   TSH 2.430 04/15/2017 0932   TSH 2.710 02/07/2017 1157   TSH 3.310 01/16/2016 1141     Ref. Range 11/26/2017 08:38  Vitamin D, 25-Hydroxy Latest Ref Range: 30.0 - 100.0 ng/mL 37.1     OBESITY BEHAVIORAL INTERVENTION VISIT  Today's visit was # 17   Starting weight: 238 lbs Starting date: 04/15/2017 Today's  weight :  235 lbs Today's date: 01/26/2018 Total lbs lost to date: 3   ASK: We discussed the diagnosis of obesity with Catherine Trevino today and Catherine Trevino agreed to give [place] permission to discuss obesity behavioral modification therapy today.  ASSESS: Catherine Trevino has the diagnosis of obesity and her BMI today is 40.32 Catherine Trevino is in the action stage of change   ADVISE: Catherine Trevino was educated on the multiple health risks of obesity as well as the benefit of weight loss to improve her health. She was advised of the need for long term treatment and the importance of lifestyle modifications to improve her current health and to decrease her risk of future health problems.  AGREE: Multiple dietary modification options and treatment options were discussed and  Catherine Trevino agreed to follow the recommendations documented in the above note.  ARRANGE: Catherine Trevino was educated on the importance of frequent visits to treat obesity as outlined per CMS and USPSTF guidelines and agreed to schedule her next follow up appointment today.  Corey Skains, am acting as Location manager for [employer]. Owens Shark, DO  I have reviewed the above documentation for accuracy and completeness, and I agree with the above. -Jearld Lesch, DO

## 2018-02-09 ENCOUNTER — Encounter (INDEPENDENT_AMBULATORY_CARE_PROVIDER_SITE_OTHER): Payer: Self-pay | Admitting: Physician Assistant

## 2018-02-09 ENCOUNTER — Ambulatory Visit (INDEPENDENT_AMBULATORY_CARE_PROVIDER_SITE_OTHER): Payer: BC Managed Care – PPO | Admitting: Physician Assistant

## 2018-02-09 VITALS — BP 122/82 | HR 88 | Temp 98.2°F | Ht 64.0 in | Wt 235.0 lb

## 2018-02-09 DIAGNOSIS — R7303 Prediabetes: Secondary | ICD-10-CM

## 2018-02-09 DIAGNOSIS — E559 Vitamin D deficiency, unspecified: Secondary | ICD-10-CM

## 2018-02-09 DIAGNOSIS — Z9189 Other specified personal risk factors, not elsewhere classified: Secondary | ICD-10-CM | POA: Diagnosis not present

## 2018-02-09 DIAGNOSIS — R0602 Shortness of breath: Secondary | ICD-10-CM | POA: Diagnosis not present

## 2018-02-09 DIAGNOSIS — Z6841 Body Mass Index (BMI) 40.0 and over, adult: Secondary | ICD-10-CM

## 2018-02-09 DIAGNOSIS — F3289 Other specified depressive episodes: Secondary | ICD-10-CM | POA: Diagnosis not present

## 2018-02-09 MED ORDER — METFORMIN HCL 500 MG PO TABS: 500.0000 mg | ORAL_TABLET | Freq: Every day | ORAL | 0 refills | Status: DC

## 2018-02-09 MED ORDER — VITAMIN D (ERGOCALCIFEROL) 1.25 MG (50000 UNIT) PO CAPS: 50000.0000 [IU] | ORAL_CAPSULE | ORAL | 0 refills | Status: DC

## 2018-02-09 MED ORDER — BUPROPION HCL ER (SR) 150 MG PO TB12: 150.0000 mg | ORAL_TABLET | Freq: Every day | ORAL | 0 refills | Status: DC

## 2018-02-10 NOTE — Progress Notes (Signed)
Office: [phone]  /  Fax: [phone]   HPI:   Chief Complaint: OBESITY Catherine Trevino is here to discuss her progress with her obesity treatment plan. She is on the Category 3 plan and is following her eating plan approximately 90 to 95 % of the time. She states she is exercising 0 minutes 0 times per week. Laiklyn reports that she has been following the plan closely. She states that she is getting frustrated with the lack of weight loss. Her weight is 235 lb (106.6 kg) today and she has maintained weight over a period of 2 weeks since her last visit. She has lost 3 lbs since starting treatment with [place].  Pre-Diabetes Catherine Trevino has a diagnosis of prediabetes based on her elevated Hgb A1c and was informed this puts her at greater risk of developing diabetes. She has no nausea, vomiting or diarrhea on metformin. Catherine Trevino continues to work on diet and exercise to decrease risk of diabetes. Catherine Trevino denies polyphagia.  At risk for diabetes Catherine Trevino is at higher than average risk for developing diabetes due to her obesity and prediabetes. She currently denies polyuria or polydipsia.  Vitamin D deficiency Catherine Trevino has a diagnosis of vitamin D deficiency. She is currently taking vit D and denies nausea, vomiting or muscle weakness.  Shortness of Breath on exertion Catherine Trevino notes shortness of breath on exertion. Catherine Trevino denies chest pain or dizziness.  Depression with emotional eating behaviors Catherine Trevino is struggling with emotional eating and using food for comfort to the extent that it is negatively impacting her health. She often snacks when she is not hungry. Catherine Trevino sometimes feels she is out of control and then feels guilty that she made poor food choices. She has been working on behavior modification techniques to help reduce her emotional eating and has been somewhat successful. She denies cravings. She shows no sign of suicidal or homicidal ideations.  Depression screen Catherine Trevino 2/9 04/15/2017  02/07/2017 01/16/2016 10/25/2014  Decreased Interest 1 0 0 0  Down, Depressed, Hopeless 1 0 0 0  PHQ - 2 Score 2 0 0 0  Altered sleeping 1 - - -  Tired, decreased energy 1 - - -  Change in appetite 2 - - -  Feeling bad or failure about yourself  1 - - -  Trouble concentrating 1 - - -  Moving slowly or fidgety/restless 1 - - -  Suicidal thoughts 0 - - -  PHQ-9 Score 9 - - -  Difficult doing work/chores Not difficult at all - - -     ASSESSMENT AND PLAN:  Prediabetes - Plan: metFORMIN (GLUCOPHAGE) 500 MG tablet  Vitamin D deficiency - Plan: Vitamin D, Ergocalciferol, (DRISDOL) 1.25 MG (50000 UT) CAPS capsule  Other depression - with emotional eating  - Plan: buPROPion (WELLBUTRIN SR) 150 MG 12 hr tablet  Shortness of breath on exertion  At risk for diabetes mellitus  Class 3 severe obesity with serious comorbidity and body mass index (BMI) of 40.0 to 44.9 in adult, unspecified obesity type ([name])  PLAN:  Pre-Diabetes Catherine Trevino will continue to work on weight loss, exercise, and decreasing simple carbohydrates in her diet to help decrease the risk of diabetes. We dicussed metformin including benefits and risks. She was informed that eating too many simple carbohydrates or too many calories at one sitting increases the likelihood of GI side effects. [name] agreed to continue metformin 500 mg daily #30 with no refills and follow up with [place] as directed to monitor her progress.  Diabetes risk  counseling [name] was given extended (15 minutes) diabetes prevention counseling today. She is 58 y.o. female and has risk factors for diabetes including obesity and prediabetes. We discussed intensive lifestyle modifications today with an emphasis on weight loss as well as increasing exercise and decreasing simple carbohydrates in her diet.  Vitamin D Deficiency Catherine Trevino was informed that low vitamin D levels contributes to fatigue and are associated with obesity, breast, and colon cancer. She  agrees to continue to take prescription Vit D @50 ,000 IU every 3 days #10 with no refills and will follow up for routine testing of vitamin D, at least 2-3 times per year. She was informed of the risk of over-replacement of vitamin D and agrees to not increase her dose unless she discusses this with [place] first. Catherine Trevino agrees to follow up as directed.  Shortness of Breath on exertion Indirect Calorimetry was ordered today. The indirect calorimeter results showed VO2 of 315 and a REE of 2193.  Depression with Emotional Eating Behaviors We discussed behavior modification techniques today to help [name] deal with her emotional eating and depression. She has agreed to take (Bupropion) Wellbutrin SR 150 mg qd #30 with no refills and discontinue Topamax for now. [name] agreed to follow up as directed.  Obesity Catherine Trevino is currently in the action stage of change. As such, her goal is to continue with weight loss efforts She has agreed to keep a food journal with 1500 calories and 95 grams of protein daily Catherine Trevino has been instructed to work up to a goal of 150 minutes of combined cardio and strengthening exercise per week for weight loss and overall health benefits. We discussed the following Behavioral Modification Strategies today: planning for success and work on meal planning and easy cooking plans  Catherine Trevino has agreed to follow up with our clinic in 2 weeks. She was informed of the importance of frequent follow up visits to maximize her success with intensive lifestyle modifications for her multiple health conditions.  ALLERGIES: No Known Allergies  MEDICATIONS: Current Outpatient Medications on File Prior to Visit  Medication Sig Dispense Refill  . lisinopril-hydrochlorothiazide (PRINZIDE,ZESTORETIC) 10-12.5 MG tablet TAKE 1 TABLET DAILY. 90 tablet 3  . Multiple Vitamins-Minerals (MULTIVITAMIN WOMEN 50+ PO) Take 1 tablet by mouth daily.    Marland Kitchen omega-3 acid ethyl esters (LOVAZA) 1 g capsule  Take by mouth daily.    Marland Kitchen OVER THE COUNTER MEDICATION Take 1 capsule by mouth daily. Tumeric/Curcumin 500 mg taking     . polyethylene glycol powder (GLYCOLAX/MIRALAX) powder Take 17 g by mouth daily. Take every 12 hours 3350 g 0  . topiramate (TOPAMAX) 100 MG tablet Take 1 tablet (100 mg total) by mouth daily. 90 tablet 3   No current facility-administered medications on file prior to visit.     PAST MEDICAL HISTORY: Past Medical History:  Diagnosis Date  . Anemia   . ANGIODYSPLASIA, INTESTINE, WITHOUT HEMORRHAGE 08/16/2009   Qualifier: Diagnosis of  By: Carlean Purl MD, Dimas Millin Diabetes mellitus without complication ([place])    pt stated prediabetes, not on meds  . Diverticulitis   . GERD (gastroesophageal reflux disease)   . Hx of adenomatous colonic polyps 12/29/2014  . Hypertension   . Obesity   . OSA (obstructive sleep apnea) 01/06/2016   Mild. CPAP discontinued 12/2016 following significant weight loss.   . OSA on CPAP 01/06/2016    PAST SURGICAL HISTORY: Past Surgical History:  Procedure Laterality Date  . ABDOMINAL HYSTERECTOMY    . COLONOSCOPY    .  TUBAL LIGATION      SOCIAL HISTORY: Social History   Tobacco Use  . Smoking status: Never Smoker  . Smokeless tobacco: Never Used  Substance Use Topics  . Alcohol use: No    Alcohol/week: 0.0 standard drinks  . Drug use: No    FAMILY HISTORY: Family History  Problem Relation Age of Onset  . Hypertension Father   . Emphysema Maternal Grandmother   . Cancer Maternal Grandfather   . Hypertension Paternal Grandmother   . Cancer Paternal Grandmother   . Diabetes Paternal Grandmother   . Hyperlipidemia Paternal Grandmother   . Hypertension Paternal Grandfather   . Diabetes Paternal Grandfather   . Hyperlipidemia Paternal Grandfather   . Colon cancer Neg Hx     ROS: Review of Systems  Constitutional: Negative for weight loss.  Respiratory: Positive for shortness of breath (on exertion).   Cardiovascular:  Negative for chest pain.  Gastrointestinal: Negative for diarrhea, nausea and vomiting.  Genitourinary: Negative for frequency.  Musculoskeletal:       Negative for muscle weakness  Neurological: Negative for dizziness.  Endo/Heme/Allergies: Negative for polydipsia.       Negative for polyphagia  Psychiatric/Behavioral: Positive for depression. Negative for suicidal ideas.    PHYSICAL EXAM: Blood pressure 122/82, pulse 88, temperature 98.2 F (36.8 C), temperature source Oral, height 5\' 4"  (1.626 m), weight 235 lb (106.6 kg), SpO2 96 %. Body mass index is 40.34 kg/m. Physical Exam Vitals signs reviewed.  Constitutional:      Appearance: Normal appearance. She is well-developed. She is obese.  Cardiovascular:     Rate and Rhythm: Normal rate.  Pulmonary:     Effort: Pulmonary effort is normal.  Musculoskeletal: Normal range of motion.  Skin:    General: Skin is warm and dry.  Neurological:     Mental Status: She is alert and oriented to person, place, and time.  Psychiatric:        Mood and Affect: Mood normal.        Behavior: Behavior normal.     RECENT LABS AND TESTS: BMET    Component Value Date/Time   NA 141 11/26/2017 0838   K 3.8 11/26/2017 0838   CL 106 11/26/2017 0838   CO2 21 11/26/2017 0838   GLUCOSE 117 (H) 11/26/2017 0838   GLUCOSE 138 (H) 03/10/2016 0919   BUN 21 11/26/2017 0838   CREATININE 1.15 (H) 11/26/2017 0838   CREATININE 0.86 01/31/2015 1129   CALCIUM 9.2 11/26/2017 0838   GFRNONAA 53 (L) 11/26/2017 0838   GFRAA 61 11/26/2017 0838   Lab Results  Component Value Date   HGBA1C 5.5 11/26/2017   HGBA1C 5.7 (H) 08/05/2017   HGBA1C 5.7 (H) 04/15/2017   HGBA1C 5.6 02/07/2017   HGBA1C 5.9 (H) 01/16/2016   Lab Results  Component Value Date   INSULIN 22.8 11/26/2017   INSULIN 19.3 08/05/2017   INSULIN 18.2 04/15/2017   CBC    Component Value Date/Time   WBC 7.2 04/15/2017 0932   WBC 12.7 (H) 03/10/2016 0909   RBC 4.79 04/15/2017 0932     RBC 4.80 03/10/2016 0909   HGB 14.2 04/15/2017 0932   HCT 41.8 04/15/2017 0932   PLT 223 02/07/2017 1157   MCV 87 04/15/2017 0932   MCH 29.6 04/15/2017 0932   MCH 29.2 03/10/2016 0909   MCHC 34.0 04/15/2017 0932   MCHC 34.6 03/10/2016 0909   RDW 13.9 04/15/2017 0932   LYMPHSABS 2.5 04/15/2017 0932   MONOABS 1.0 03/10/2016  0909   EOSABS 0.1 04/15/2017 0932   BASOSABS 0.0 04/15/2017 0932   Iron/TIBC/Ferritin/ %Sat    Component Value Date/Time   FERRITIN 16 06/29/2009   Lipid Panel     Component Value Date/Time   CHOL 162 11/26/2017 0838   TRIG 165 (H) 11/26/2017 0838   HDL 46 11/26/2017 0838   CHOLHDL 3.5 11/26/2017 0838   CHOLHDL 3.5 01/31/2015 1129   VLDL 31 (H) 01/31/2015 1129   LDLCALC 83 11/26/2017 0838   Hepatic Function Panel     Component Value Date/Time   PROT 6.8 11/26/2017 0838   ALBUMIN 4.2 11/26/2017 0838   AST 16 11/26/2017 0838   ALT 32 11/26/2017 0838   ALKPHOS 87 11/26/2017 0838   BILITOT 0.4 11/26/2017 0838   BILIDIR 0.2 03/10/2016 0909   IBILI 0.7 03/10/2016 0909      Component Value Date/Time   TSH 2.430 04/15/2017 0932   TSH 2.710 02/07/2017 1157   TSH 3.310 01/16/2016 1141     Ref. Range 11/26/2017 08:38  Vitamin D, 25-Hydroxy Latest Ref Range: 30.0 - 100.0 ng/mL 37.1     OBESITY BEHAVIORAL INTERVENTION VISIT  Today's visit was # 18   Starting weight: 238 lbs Starting date: 04/15/2017 Today's weight : 235 lbs Today's date: 02/09/2018 Total lbs lost to date: 3   ASK: We discussed the diagnosis of obesity with Donata Duff today and Jill agreed to give [place] permission to discuss obesity behavioral modification therapy today.  ASSESS: Marcie has the diagnosis of obesity and her BMI today is 40.32 Sharee is in the action stage of change   ADVISE: Loredana was educated on the multiple health risks of obesity as well as the benefit of weight loss to improve her health. She was advised of the need for long term  treatment and the importance of lifestyle modifications to improve her current health and to decrease her risk of future health problems.  AGREE: Multiple dietary modification options and treatment options were discussed and  Somara agreed to follow the recommendations documented in the above note.  ARRANGE: Alayna was educated on the importance of frequent visits to treat obesity as outlined per CMS and USPSTF guidelines and agreed to schedule her next follow up appointment today.  Corey Skains, am acting as transcriptionist for Abby Potash, PA-C I, Abby Potash, PA-C have reviewed above note and agree with its content

## 2018-02-24 ENCOUNTER — Ambulatory Visit (INDEPENDENT_AMBULATORY_CARE_PROVIDER_SITE_OTHER): Payer: BC Managed Care – PPO | Admitting: Physician Assistant

## 2018-03-03 ENCOUNTER — Encounter (INDEPENDENT_AMBULATORY_CARE_PROVIDER_SITE_OTHER): Payer: Self-pay | Admitting: Physician Assistant

## 2018-03-03 ENCOUNTER — Ambulatory Visit (INDEPENDENT_AMBULATORY_CARE_PROVIDER_SITE_OTHER): Payer: BC Managed Care – PPO | Admitting: Physician Assistant

## 2018-03-03 VITALS — BP 132/85 | HR 95 | Temp 98.1°F | Ht 64.0 in | Wt 235.0 lb

## 2018-03-03 DIAGNOSIS — E559 Vitamin D deficiency, unspecified: Secondary | ICD-10-CM

## 2018-03-03 DIAGNOSIS — Z6841 Body Mass Index (BMI) 40.0 and over, adult: Secondary | ICD-10-CM

## 2018-03-03 NOTE — Progress Notes (Signed)
Office: [phone]  /  Fax: [phone]   HPI:   Chief Complaint: OBESITY Catherine Trevino is here to discuss her progress with her obesity treatment plan. She is on the Category 3 plan and is following her eating plan approximately 80% of the time. She states she is band strengthening 15-30 minutes 3-4 times per week. Catherine Trevino reports that she was doing well with weight loss and then went to a conference where food was provided. Since she came back she is back on track.  Her weight is 235 lb (106.6 kg) today and has not lost weight since her last visit. She has lost 3 lbs since starting treatment with [place].  Vitamin D deficiency Catherine Trevino has a diagnosis of Vitamin D deficiency. She is currently taking prescription Vit D and denies nausea, vomiting or muscle weakness.  ASSESSMENT AND PLAN:  Vitamin D deficiency  Class 3 severe obesity with serious comorbidity and body mass index (BMI) of 40.0 to 44.9 in adult, unspecified obesity type ([place])  PLAN:  Vitamin D Deficiency Catherine Trevino was informed that low Vitamin D levels contributes to fatigue and are associated with obesity, breast, and colon cancer. She agrees to continue to take prescription Vit D and will follow-up for routine testing of Vitamin D, at least 2-3 times per year. She was informed of the risk of over-replacement of Vitamin D and agrees to not increase her dose unless she discusses this with [place] first. Catherine Trevino agrees to follow-up with our clinic in 2 weeks.  Obesity Catherine Trevino is currently in the action stage of change. As such, her goal is to continue with weight loss efforts. She has agreed to follow the Category 3 plan. Catherine Trevino has been instructed to work up to a goal of 150 minutes of combined cardio and strengthening exercise per week for weight loss and overall health benefits. We discussed the following Behavioral Modification Strategies today: work on meal planning and easy cooking plans and planning for  success.  Catherine Trevino has agreed to follow-up with our clinic in 2 weeks. She was informed of the importance of frequent follow up visits to maximize her success with intensive lifestyle modifications for her multiple health conditions.  ALLERGIES: No Known Allergies  MEDICATIONS: Current Outpatient Medications on File Prior to Visit  Medication Sig Dispense Refill  . buPROPion (WELLBUTRIN SR) 150 MG 12 hr tablet Take 1 tablet (150 mg total) by mouth daily. 30 tablet 0  . lisinopril-hydrochlorothiazide (PRINZIDE,ZESTORETIC) 10-12.5 MG tablet TAKE 1 TABLET DAILY. 90 tablet 3  . metFORMIN (GLUCOPHAGE) 500 MG tablet Take 1 tablet (500 mg total) by mouth daily with breakfast. 30 tablet 0  . Multiple Vitamins-Minerals (MULTIVITAMIN WOMEN 50+ PO) Take 1 tablet by mouth daily.    Marland Kitchen omega-3 acid ethyl esters (LOVAZA) 1 g capsule Take by mouth daily.    Marland Kitchen OVER THE COUNTER MEDICATION Take 1 capsule by mouth daily. Tumeric/Curcumin 500 mg taking     . polyethylene glycol powder (GLYCOLAX/MIRALAX) powder Take 17 g by mouth daily. Take every 12 hours 3350 g 0  . Vitamin D, Ergocalciferol, (DRISDOL) 1.25 MG (50000 UT) CAPS capsule Take 1 capsule (50,000 Units total) by mouth every 3 (three) days. 10 capsule 0   No current facility-administered medications on file prior to visit.     PAST MEDICAL HISTORY: Past Medical History:  Diagnosis Date  . Anemia   . ANGIODYSPLASIA, INTESTINE, WITHOUT HEMORRHAGE 08/16/2009   Qualifier: Diagnosis of  By: Carlean Purl MD, Dimas Trevino Diabetes mellitus  without complication ([place])    pt stated prediabetes, not on meds  . Diverticulitis   . GERD (gastroesophageal reflux disease)   . Hx of adenomatous colonic polyps 12/29/2014  . Hypertension   . Obesity   . OSA (obstructive sleep apnea) 01/06/2016   Mild. CPAP discontinued 12/2016 following significant weight loss.   . OSA on CPAP 01/06/2016    PAST SURGICAL HISTORY: Past Surgical History:  Procedure Laterality  Date  . ABDOMINAL HYSTERECTOMY    . COLONOSCOPY    . TUBAL LIGATION      SOCIAL HISTORY: Social History   Tobacco Use  . Smoking status: Never Smoker  . Smokeless tobacco: Never Used  Substance Use Topics  . Alcohol use: No    Alcohol/week: 0.0 standard drinks  . Drug use: No    FAMILY HISTORY: Family History  Problem Relation Age of Onset  . Hypertension Father   . Emphysema Maternal Grandmother   . Cancer Maternal Grandfather   . Hypertension Paternal Grandmother   . Cancer Paternal Grandmother   . Diabetes Paternal Grandmother   . Hyperlipidemia Paternal Grandmother   . Hypertension Paternal Grandfather   . Diabetes Paternal Grandfather   . Hyperlipidemia Paternal Grandfather   . Colon cancer Neg Hx    ROS: Review of Systems  Constitutional: Negative for weight loss.  Gastrointestinal: Negative for nausea and vomiting.  Musculoskeletal:       Negative for muscle weakness.  Endo/Heme/Allergies:       Negative for hypoglycemia.   PHYSICAL EXAM: Blood pressure 132/85, pulse 95, temperature 98.1 F (36.7 C), height 5\' 4"  (1.626 m), weight 235 lb (106.6 kg), SpO2 97 %. Body mass index is 40.34 kg/m. Physical Exam Vitals signs reviewed.  Constitutional:      Appearance: Normal appearance. She is obese.  Cardiovascular:     Rate and Rhythm: Normal rate.     Pulses: Normal pulses.  Pulmonary:     Effort: Pulmonary effort is normal.     Breath sounds: Normal breath sounds.  Musculoskeletal: Normal range of motion.  Skin:    General: Skin is warm and dry.  Neurological:     Mental Status: She is alert and oriented to person, place, and time.  Psychiatric:        Behavior: Behavior normal.   RECENT LABS AND TESTS: BMET    Component Value Date/Time   NA 141 11/26/2017 0838   K 3.8 11/26/2017 0838   CL 106 11/26/2017 0838   CO2 21 11/26/2017 0838   GLUCOSE 117 (H) 11/26/2017 0838   GLUCOSE 138 (H) 03/10/2016 0919   BUN 21 11/26/2017 0838   CREATININE  1.15 (H) 11/26/2017 0838   CREATININE 0.86 01/31/2015 1129   CALCIUM 9.2 11/26/2017 0838   GFRNONAA 53 (L) 11/26/2017 0838   GFRAA 61 11/26/2017 0838   Lab Results  Component Value Date   HGBA1C 5.5 11/26/2017   HGBA1C 5.7 (H) 08/05/2017   HGBA1C 5.7 (H) 04/15/2017   HGBA1C 5.6 02/07/2017   HGBA1C 5.9 (H) 01/16/2016   Lab Results  Component Value Date   INSULIN 22.8 11/26/2017   INSULIN 19.3 08/05/2017   INSULIN 18.2 04/15/2017   CBC    Component Value Date/Time   WBC 7.2 04/15/2017 0932   WBC 12.7 (H) 03/10/2016 0909   RBC 4.79 04/15/2017 0932   RBC 4.80 03/10/2016 0909   HGB 14.2 04/15/2017 0932   HCT 41.8 04/15/2017 0932   PLT 223 02/07/2017 1157   MCV 87  04/15/2017 0932   MCH 29.6 04/15/2017 0932   MCH 29.2 03/10/2016 0909   MCHC 34.0 04/15/2017 0932   MCHC 34.6 03/10/2016 0909   RDW 13.9 04/15/2017 0932   LYMPHSABS 2.5 04/15/2017 0932   MONOABS 1.0 03/10/2016 0909   EOSABS 0.1 04/15/2017 0932   BASOSABS 0.0 04/15/2017 0932   Iron/TIBC/Ferritin/ %Sat    Component Value Date/Time   FERRITIN 16 06/29/2009   Lipid Panel     Component Value Date/Time   CHOL 162 11/26/2017 0838   TRIG 165 (H) 11/26/2017 0838   HDL 46 11/26/2017 0838   CHOLHDL 3.5 11/26/2017 0838   CHOLHDL 3.5 01/31/2015 1129   VLDL 31 (H) 01/31/2015 1129   LDLCALC 83 11/26/2017 0838   Hepatic Function Panel     Component Value Date/Time   PROT 6.8 11/26/2017 0838   ALBUMIN 4.2 11/26/2017 0838   AST 16 11/26/2017 0838   ALT 32 11/26/2017 0838   ALKPHOS 87 11/26/2017 0838   BILITOT 0.4 11/26/2017 0838   BILIDIR 0.2 03/10/2016 0909   IBILI 0.7 03/10/2016 0909      Component Value Date/Time   TSH 2.430 04/15/2017 0932   TSH 2.710 02/07/2017 1157   TSH 3.310 01/16/2016 1141    Ref. Range 11/26/2017 08:38  Vitamin D, 25-Hydroxy Latest Ref Range: 30.0 - 100.0 ng/mL 37.1    OBESITY BEHAVIORAL INTERVENTION VISIT  Today's visit was #19  Starting weight: 238 lbs Starting date:  04/15/2017 Today's weight: 235 lbs  Today's date: 03/03/2018 Total lbs lost to date: 3  ASK: We discussed the diagnosis of obesity with Catherine Trevino today and Catherine Trevino agreed to give [place] permission to discuss obesity behavioral modification therapy today.  ASSESS: Catherine Trevino has the diagnosis of obesity and her BMI today is 40.34. Catherine Trevino is in the action stage of change.  ADVISE: Catherine Trevino was educated on the multiple health risks of obesity as well as the benefit of weight loss to improve her health. She was advised of the need for long term treatment and the importance of lifestyle modifications to improve her current health and to decrease her risk of future health problems.  AGREE: Multiple dietary modification options and treatment options were discussed and  Catherine Trevino agreed to follow the recommendations documented in the above note.  ARRANGE: Catherine Trevino was educated on the importance of frequent visits to treat obesity as outlined per CMS and USPSTF guidelines and agreed to schedule her next follow up appointment today.  Catherine Trevino, am acting as transcriptionist for Abby Potash, PA-C I, Abby Potash, PA-C have reviewed above note and agree with its content

## 2018-03-17 ENCOUNTER — Encounter (INDEPENDENT_AMBULATORY_CARE_PROVIDER_SITE_OTHER): Payer: Self-pay

## 2018-03-17 ENCOUNTER — Ambulatory Visit (INDEPENDENT_AMBULATORY_CARE_PROVIDER_SITE_OTHER): Payer: BC Managed Care – PPO | Admitting: Physician Assistant

## 2018-03-18 ENCOUNTER — Ambulatory Visit (INDEPENDENT_AMBULATORY_CARE_PROVIDER_SITE_OTHER): Payer: BC Managed Care – PPO | Admitting: Family Medicine

## 2018-03-18 ENCOUNTER — Encounter (INDEPENDENT_AMBULATORY_CARE_PROVIDER_SITE_OTHER): Payer: Self-pay | Admitting: Family Medicine

## 2018-03-18 VITALS — BP 133/83 | HR 60 | Temp 98.1°F | Ht 64.0 in | Wt 241.0 lb

## 2018-03-18 DIAGNOSIS — E559 Vitamin D deficiency, unspecified: Secondary | ICD-10-CM | POA: Diagnosis not present

## 2018-03-18 DIAGNOSIS — F3289 Other specified depressive episodes: Secondary | ICD-10-CM | POA: Diagnosis not present

## 2018-03-18 DIAGNOSIS — Z9189 Other specified personal risk factors, not elsewhere classified: Secondary | ICD-10-CM

## 2018-03-18 DIAGNOSIS — Z6841 Body Mass Index (BMI) 40.0 and over, adult: Secondary | ICD-10-CM

## 2018-03-18 DIAGNOSIS — R7303 Prediabetes: Secondary | ICD-10-CM

## 2018-03-18 MED ORDER — METFORMIN HCL 500 MG PO TABS: 500.0000 mg | ORAL_TABLET | Freq: Every day | ORAL | 0 refills | Status: DC

## 2018-03-19 NOTE — Progress Notes (Signed)
Office: [phone]  /  Fax: [phone]   HPI:   Chief Complaint: OBESITY Catherine Trevino is here to discuss her progress with her obesity treatment plan. She is on the Category 3 plan and is following her eating plan approximately 50 % of the time. She states she is walking for 30 minutes 3 times per week. Catherine Trevino notes fairly significant stressors in the past week and a half. She was demoted at work, so she struggled with lack of appetite for all of the week. She is trying to cope and has been trying to start eating again. She started eating on the plan today.  Her weight is 241 lb (109.3 kg) today and has gained 6 pounds since her last visit. She has lost 0 lbs since starting treatment with [place].  Pre-Diabetes Catherine Trevino has a diagnosis of pre-diabetes based on her elevated Hgb A1c and was informed this puts her at greater risk of developing diabetes. She denies GI side effects of metformin and notes decreased cravings. She continues to work on diet and exercise to decrease risk of diabetes. She denies hypoglycemia.  At risk for diabetes Catherine Trevino is at higher than average risk for developing diabetes due to her obesity and pre-diabetes. She currently denies polyuria or polydipsia.  Vitamin D Deficiency Catherine Trevino has a diagnosis of vitamin D deficiency. She is currently taking prescription Vit D. She notes fatigue and denies nausea, vomiting or muscle weakness.  Depression with emotional eating behaviors Catherine Trevino is struggling with emotional eating and using food for comfort to the extent that it is negatively impacting her health. She often snacks when she is not hungry. Catherine Trevino sometimes feels she is out of control and then feels guilty that she made poor food choices. She has been working on behavior modification techniques to help reduce her emotional eating and has been somewhat successful. Her blood pressure is controlled today and she shows no sign of suicidal or homicidal  ideations.  Depression screen Catherine Trevino 2/9 04/15/2017 02/07/2017 01/16/2016 10/25/2014  Decreased Interest 1 0 0 0  Down, Depressed, Hopeless 1 0 0 0  PHQ - 2 Score 2 0 0 0  Altered sleeping 1 - - -  Tired, decreased energy 1 - - -  Change in appetite 2 - - -  Feeling bad or failure about yourself  1 - - -  Trouble concentrating 1 - - -  Moving slowly or fidgety/restless 1 - - -  Suicidal thoughts 0 - - -  PHQ-9 Score 9 - - -  Difficult doing work/chores Not difficult at all - - -    ASSESSMENT AND PLAN:  Vitamin D deficiency  Other depression  At risk for diabetes mellitus  Class 3 severe obesity with serious comorbidity and body mass index (BMI) of 40.0 to 44.9 in adult, unspecified obesity type ([facility])  Prediabetes - Plan: metFORMIN (GLUCOPHAGE) 500 MG tablet  PLAN:  Pre-Diabetes Catherine Trevino will continue to work on weight loss, exercise, and decreasing simple carbohydrates in her diet to help decrease the risk of diabetes. We dicussed metformin including benefits and risks. She was informed that eating too many simple carbohydrates or too many calories at one sitting increases the likelihood of GI side effects. Catherine Trevino agrees to continue taking metformin 500 mg PO q AM #30 and we will refill for 1 month. Catherine Trevino agrees to follow up with our clinic in 2 weeks as directed to monitor her progress.  Diabetes risk counseling Catherine Trevino was given extended (15 minutes) diabetes prevention counseling  today. She is 58 y.o. female and has risk factors for diabetes including obesity and pre-diabetes. We discussed intensive lifestyle modifications today with an emphasis on weight loss as well as increasing exercise and decreasing simple carbohydrates in her diet.  Vitamin D Deficiency Catherine Trevino was informed that low vitamin D levels contributes to fatigue and are associated with obesity, breast, and colon cancer. Catherine Trevino agrees to continue taking prescription Vit D @50 ,000 IU every 3 days, no refill  needed. She will follow up for routine testing of vitamin D, at least 2-3 times per year. She was informed of the risk of over-replacement of vitamin D and agrees to not increase her dose unless she discusses this with [place] first. Catherine Trevino agrees to follow up with our clinic in 2 weeks.  Depression with Emotional Eating Behaviors We discussed behavior modification techniques today to help Catherine Trevino deal with her emotional eating and depression. Catherine Trevino agrees to continue taking Wellbutrin SR 300 mg daily, and she agrees to follow up with our clinic in 2 weeks.  Obesity Catherine Trevino is currently in the action stage of change. As such, her goal is to continue with weight loss efforts She has agreed to follow the Category 3 plan Catherine Trevino has been instructed to work up to a goal of 150 minutes of combined cardio and strengthening exercise per week for weight loss and overall health benefits. We discussed the following Behavioral Modification Strategies today: increasing lean protein intake, increasing vegetables, work on meal planning and easy cooking plans, better snacking choices, and planning for success   Catherine Trevino has agreed to follow up with our clinic in 2 weeks. She was informed of the importance of frequent follow up visits to maximize her success with intensive lifestyle modifications for her multiple health conditions.  ALLERGIES: No Known Allergies  MEDICATIONS: Current Outpatient Medications on File Prior to Visit  Medication Sig Dispense Refill  . buPROPion (WELLBUTRIN SR) 150 MG 12 hr tablet Take 1 tablet (150 mg total) by mouth daily. (Patient taking differently: Take 300 mg by mouth daily. ) 30 tablet 0  . lisinopril-hydrochlorothiazide (PRINZIDE,ZESTORETIC) 10-12.5 MG tablet TAKE 1 TABLET DAILY. 90 tablet 3  . Multiple Vitamins-Minerals (MULTIVITAMIN WOMEN 50+ PO) Take 1 tablet by mouth daily.    Marland Kitchen omega-3 acid ethyl esters (LOVAZA) 1 g capsule Take by mouth daily.    Marland Kitchen OVER THE  COUNTER MEDICATION Take 1 capsule by mouth daily. Tumeric/Curcumin 500 mg taking     . polyethylene glycol powder (GLYCOLAX/MIRALAX) powder Take 17 g by mouth daily. Take every 12 hours 3350 g 0  . traZODone (DESYREL) 50 MG tablet Take 50 mg by mouth at bedtime.    . Vitamin D, Ergocalciferol, (DRISDOL) 1.25 MG (50000 UT) CAPS capsule Take 1 capsule (50,000 Units total) by mouth every 3 (three) days. 10 capsule 0   No current facility-administered medications on file prior to visit.     PAST MEDICAL HISTORY: Past Medical History:  Diagnosis Date  . Anemia   . ANGIODYSPLASIA, INTESTINE, WITHOUT HEMORRHAGE 08/16/2009   Qualifier: Diagnosis of  By: Carlean Purl MD, Dimas Millin Diabetes mellitus without complication ([place])    pt stated prediabetes, not on meds  . Diverticulitis   . GERD (gastroesophageal reflux disease)   . Hx of adenomatous colonic polyps 12/29/2014  . Hypertension   . Obesity   . OSA (obstructive sleep apnea) 01/06/2016   Mild. CPAP discontinued 12/2016 following significant weight loss.   . OSA on CPAP 01/06/2016  PAST SURGICAL HISTORY: Past Surgical History:  Procedure Laterality Date  . ABDOMINAL HYSTERECTOMY    . COLONOSCOPY    . TUBAL LIGATION      SOCIAL HISTORY: Social History   Tobacco Use  . Smoking status: Never Smoker  . Smokeless tobacco: Never Used  Substance Use Topics  . Alcohol use: No    Alcohol/week: 0.0 standard drinks  . Drug use: No    FAMILY HISTORY: Family History  Problem Relation Age of Onset  . Hypertension Father   . Emphysema Maternal Grandmother   . Cancer Maternal Grandfather   . Hypertension Paternal Grandmother   . Cancer Paternal Grandmother   . Diabetes Paternal Grandmother   . Hyperlipidemia Paternal Grandmother   . Hypertension Paternal Grandfather   . Diabetes Paternal Grandfather   . Hyperlipidemia Paternal Grandfather   . Colon cancer Neg Hx     ROS: Review of Systems  Constitutional: Positive for  malaise/fatigue. Negative for weight loss.  Gastrointestinal: Negative for nausea and vomiting.  Genitourinary: Negative for frequency.  Musculoskeletal:       Negative muscle weakness  Endo/Heme/Allergies: Negative for polydipsia.       Negative hypoglycemia  Psychiatric/Behavioral: Positive for depression. Negative for suicidal ideas.    PHYSICAL EXAM: Blood pressure 133/83, pulse 60, temperature 98.1 F (36.7 C), temperature source Oral, height 5\' 4"  (1.626 m), weight 241 lb (109.3 kg), SpO2 100 %. Body mass index is 41.37 kg/m. Physical Exam Vitals signs reviewed.  Constitutional:      Appearance: Normal appearance. She is obese.  Cardiovascular:     Rate and Rhythm: Normal rate.     Pulses: Normal pulses.  Pulmonary:     Effort: Pulmonary effort is normal.     Breath sounds: Normal breath sounds.  Musculoskeletal: Normal range of motion.  Skin:    General: Skin is warm and dry.  Neurological:     Mental Status: She is alert and oriented to person, place, and time.  Psychiatric:        Mood and Affect: Mood normal.        Behavior: Behavior normal.     RECENT LABS AND TESTS: BMET    Component Value Date/Time   NA 141 11/26/2017 0838   K 3.8 11/26/2017 0838   CL 106 11/26/2017 0838   CO2 21 11/26/2017 0838   GLUCOSE 117 (H) 11/26/2017 0838   GLUCOSE 138 (H) 03/10/2016 0919   BUN 21 11/26/2017 0838   CREATININE 1.15 (H) 11/26/2017 0838   CREATININE 0.86 01/31/2015 1129   CALCIUM 9.2 11/26/2017 0838   GFRNONAA 53 (L) 11/26/2017 0838   GFRAA 61 11/26/2017 0838   Lab Results  Component Value Date   HGBA1C 5.5 11/26/2017   HGBA1C 5.7 (H) 08/05/2017   HGBA1C 5.7 (H) 04/15/2017   HGBA1C 5.6 02/07/2017   HGBA1C 5.9 (H) 01/16/2016   Lab Results  Component Value Date   INSULIN 22.8 11/26/2017   INSULIN 19.3 08/05/2017   INSULIN 18.2 04/15/2017   CBC    Component Value Date/Time   WBC 7.2 04/15/2017 0932   WBC 12.7 (H) 03/10/2016 0909   RBC 4.79  04/15/2017 0932   RBC 4.80 03/10/2016 0909   HGB 14.2 04/15/2017 0932   HCT 41.8 04/15/2017 0932   PLT 223 02/07/2017 1157   MCV 87 04/15/2017 0932   MCH 29.6 04/15/2017 0932   MCH 29.2 03/10/2016 0909   MCHC 34.0 04/15/2017 0932   MCHC 34.6 03/10/2016 0909   RDW  13.9 04/15/2017 0932   LYMPHSABS 2.5 04/15/2017 0932   MONOABS 1.0 03/10/2016 0909   EOSABS 0.1 04/15/2017 0932   BASOSABS 0.0 04/15/2017 0932   Iron/TIBC/Ferritin/ %Sat    Component Value Date/Time   FERRITIN 16 06/29/2009   Lipid Panel     Component Value Date/Time   CHOL 162 11/26/2017 0838   TRIG 165 (H) 11/26/2017 0838   HDL 46 11/26/2017 0838   CHOLHDL 3.5 11/26/2017 0838   CHOLHDL 3.5 01/31/2015 1129   VLDL 31 (H) 01/31/2015 1129   LDLCALC 83 11/26/2017 0838   Hepatic Function Panel     Component Value Date/Time   PROT 6.8 11/26/2017 0838   ALBUMIN 4.2 11/26/2017 0838   AST 16 11/26/2017 0838   ALT 32 11/26/2017 0838   ALKPHOS 87 11/26/2017 0838   BILITOT 0.4 11/26/2017 0838   BILIDIR 0.2 03/10/2016 0909   IBILI 0.7 03/10/2016 0909      Component Value Date/Time   TSH 2.430 04/15/2017 0932   TSH 2.710 02/07/2017 1157   TSH 3.310 01/16/2016 1141      OBESITY BEHAVIORAL INTERVENTION VISIT  Today's visit was # 20   Starting weight: 238 lbs Starting date: 04/15/17 Today's weight : 241 lbs  Today's date: 03/18/2018 Total lbs lost to date: 0    03/18/2018  Height 5\' 4"  (1.626 m)  Weight 241 lb (109.3 kg)  BMI (Calculated) 41.35  BLOOD PRESSURE - SYSTOLIC 751  BLOOD PRESSURE - DIASTOLIC 83   Body Fat % 70.0 %  Total Body Water (lbs) 87.2 lbs     ASK: We discussed the diagnosis of obesity with Catherine Trevino today and Catherine Trevino agreed to give [place] permission to discuss obesity behavioral modification therapy today.  ASSESS: Catherine Trevino has the diagnosis of obesity and her BMI today is 41.35 Catherine Trevino is in the action stage of change   ADVISE: Akya was educated on the multiple health  risks of obesity as well as the benefit of weight loss to improve her health. She was advised of the need for long term treatment and the importance of lifestyle modifications to improve her current health and to decrease her risk of future health problems.  AGREE: Multiple dietary modification options and treatment options were discussed and  Catherine Trevino agreed to follow the recommendations documented in the above note.  ARRANGE: Catherine Trevino was educated on the importance of frequent visits to treat obesity as outlined per CMS and USPSTF guidelines and agreed to schedule her next follow up appointment today.  I, Trixie Dredge, am acting as transcriptionist for Ilene Qua, MD  I have reviewed the above documentation for accuracy and completeness, and I agree with the above. - Ilene Qua, MD

## 2018-04-07 ENCOUNTER — Encounter (INDEPENDENT_AMBULATORY_CARE_PROVIDER_SITE_OTHER): Payer: Self-pay | Admitting: Family Medicine

## 2018-04-07 ENCOUNTER — Other Ambulatory Visit: Payer: Self-pay

## 2018-04-07 ENCOUNTER — Ambulatory Visit (INDEPENDENT_AMBULATORY_CARE_PROVIDER_SITE_OTHER): Payer: BC Managed Care – PPO | Admitting: Family Medicine

## 2018-04-07 ENCOUNTER — Encounter (INDEPENDENT_AMBULATORY_CARE_PROVIDER_SITE_OTHER): Payer: Self-pay

## 2018-04-07 DIAGNOSIS — F3289 Other specified depressive episodes: Secondary | ICD-10-CM

## 2018-04-07 DIAGNOSIS — E559 Vitamin D deficiency, unspecified: Secondary | ICD-10-CM

## 2018-04-07 DIAGNOSIS — Z6841 Body Mass Index (BMI) 40.0 and over, adult: Secondary | ICD-10-CM

## 2018-04-07 MED ORDER — VITAMIN D (ERGOCALCIFEROL) 1.25 MG (50000 UNIT) PO CAPS: 50000.0000 [IU] | ORAL_CAPSULE | ORAL | 0 refills | Status: DC

## 2018-04-07 NOTE — Progress Notes (Signed)
Office: [phone]  /  Fax: [phone]   HPI:   Chief Complaint: OBESITY Catherine Trevino is here to discuss her progress with her obesity treatment plan. She is on the Category 3 plan and is following her eating plan approximately 95 % of the time. She states she is walking for 30 minutes 3 times per week. Catherine Trevino feels she has lost about 5 lbs. She has been able to find the food easily, and had a stock pile previously. She is getting in around 10 oz of meat at dinner. She feels working from home is actually going to limit her temptations.   Vitamin D Deficiency Catherine Trevino has a diagnosis of vitamin D deficiency. She is currently taking prescription Vit D. She notes fatigue and denies nausea, vomiting or muscle weakness.  Depression with emotional eating behaviors Catherine Trevino's blood pressure was controlled previously. She is to have [place] refill her medication. Catherine Trevino struggles with emotional eating and using food for comfort to the extent that it is negatively impacting her health. She often snacks when she is not hungry. Catherine Trevino sometimes feels she is out of control and then feels guilty that she made poor food choices. She has been working on behavior modification techniques to help reduce her emotional eating and has been somewhat successful. She shows no sign of suicidal or homicidal ideations.  Depression screen [facility] 2/9 04/15/2017 02/07/2017 01/16/2016 10/25/2014  Decreased Interest 1 0 0 0  Down, Depressed, Hopeless 1 0 0 0  PHQ - 2 Score 2 0 0 0  Altered sleeping 1 - - -  Tired, decreased energy 1 - - -  Change in appetite 2 - - -  Feeling bad or failure about yourself  1 - - -  Trouble concentrating 1 - - -  Moving slowly or fidgety/restless 1 - - -  Suicidal thoughts 0 - - -  PHQ-9 Score 9 - - -  Difficult doing work/chores Not difficult at all - - -    ASSESSMENT AND PLAN:  Vitamin D deficiency - Plan: Vitamin D, Ergocalciferol, (DRISDOL) 1.25 MG (50000 UT) CAPS capsule  Other  depression - with emotional eating  Class 3 severe obesity with serious comorbidity and body mass index (BMI) of 40.0 to 44.9 in adult, unspecified obesity type (HCC)  PLAN:  Vitamin D Deficiency Catherine Trevino was informed that low vitamin D levels contributes to fatigue and are associated with obesity, breast, and colon cancer. Catherine Trevino agrees to continue taking prescription Vit D @50 ,000 IU every week #4 and we will refill for 1 month. She will follow up for routine testing of vitamin D, at least 2-3 times per year. She was informed of the risk of over-replacement of vitamin D and agrees to not increase her dose unless she discusses this with [place] first. Catherine Trevino agrees to follow up with our clinic in 2 weeks.  Depression with Emotional Eating Behaviors We discussed behavior modification techniques today to help Catherine Trevino deal with her emotional eating and depression. Catherine Trevino agrees to continue taking bupropion, no refill needed. Catherine Trevino agrees to follow up with our clinic in 2 weeks.  I have spent 21 minutes with the patient on the telehealth video call.  Obesity Catherine Trevino is currently in the action stage of change. As such, her goal is to continue with weight loss efforts She has agreed to follow the Category 3 plan Catherine Trevino has been instructed to work up to a goal of 150 minutes of combined cardio and strengthening exercise per week or add 15  minutes of resistance training 2 times per week for weight loss and overall health benefits. We discussed the following Behavioral Modification Strategies today: increasing lean protein intake, increasing vegetables and work on meal planning and easy cooking plans, and planning for success   Catherine Trevino has agreed to follow up with our clinic in 2 weeks. She was informed of the importance of frequent follow up visits to maximize her success with intensive lifestyle modifications for her multiple health conditions.  ALLERGIES: No Known Allergies   MEDICATIONS: Current Outpatient Medications on File Prior to Visit  Medication Sig Dispense Refill  . buPROPion (WELLBUTRIN SR) 150 MG 12 hr tablet Take 1 tablet (150 mg total) by mouth daily. (Patient taking differently: Take 300 mg by mouth daily. ) 30 tablet 0  . lisinopril-hydrochlorothiazide (PRINZIDE,ZESTORETIC) 10-12.5 MG tablet TAKE 1 TABLET DAILY. 90 tablet 3  . metFORMIN (GLUCOPHAGE) 500 MG tablet Take 1 tablet (500 mg total) by mouth daily with breakfast. 30 tablet 0  . Multiple Vitamins-Minerals (MULTIVITAMIN WOMEN 50+ PO) Take 1 tablet by mouth daily.    Marland Kitchen omega-3 acid ethyl esters (LOVAZA) 1 g capsule Take by mouth daily.    Marland Kitchen OVER THE COUNTER MEDICATION Take 1 capsule by mouth daily. Tumeric/Curcumin 500 mg taking     . polyethylene glycol powder (GLYCOLAX/MIRALAX) powder Take 17 g by mouth daily. Take every 12 hours 3350 g 0  . traZODone (DESYREL) 50 MG tablet Take 50 mg by mouth at bedtime.     No current facility-administered medications on file prior to visit.     PAST MEDICAL HISTORY: Past Medical History:  Diagnosis Date  . Anemia   . 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 Diverticulitis   . GERD (gastroesophageal reflux disease)   . Hx of adenomatous colonic polyps 12/29/2014  . Hypertension   . Obesity   . OSA (obstructive sleep apnea) 01/06/2016   Mild. CPAP discontinued 12/2016 following significant weight loss.   . OSA on CPAP 01/06/2016    PAST SURGICAL HISTORY: Past Surgical History:  Procedure Laterality Date  . ABDOMINAL HYSTERECTOMY    . COLONOSCOPY    . TUBAL LIGATION      SOCIAL HISTORY: Social History   Tobacco Use  . Smoking status: Never Smoker  . Smokeless tobacco: Never Used  Substance Use Topics  . Alcohol use: No    Alcohol/week: 0.0 standard drinks  . Drug use: No     FAMILY HISTORY: Family History  Problem Relation Age of Onset  . Hypertension Father   . Emphysema Maternal Grandmother   . Cancer Maternal Grandfather   . Hypertension Paternal Grandmother   . Cancer Paternal Grandmother   . Diabetes Paternal Grandmother   . Hyperlipidemia Paternal Grandmother   . Hypertension Paternal Grandfather   . Diabetes Paternal Grandfather   . Hyperlipidemia Paternal Grandfather   . Colon cancer Neg Hx     ROS: Review of Systems  Constitutional: Positive for malaise/fatigue and weight loss.  Gastrointestinal: Negative for nausea and vomiting.  Musculoskeletal:       Negative muscle weakness  Psychiatric/Behavioral: Positive for depression. Negative for suicidal ideas.    PHYSICAL EXAM: There were no vitals taken for this visit. There is no height or weight on file to calculate BMI. Physical Exam  RECENT LABS AND TESTS: BMET  Component Value Date/Time   NA 141 11/26/2017 0838   K 3.8 11/26/2017 0838   CL 106 11/26/2017 0838   CO2 21 11/26/2017 0838   GLUCOSE 117 (H) 11/26/2017 0838   GLUCOSE 138 (H) 03/10/2016 0919   BUN 21 11/26/2017 0838   CREATININE 1.15 (H) 11/26/2017 0838   CREATININE 0.86 01/31/2015 1129   CALCIUM 9.2 11/26/2017 0838   GFRNONAA 53 (L) 11/26/2017 0838   GFRAA 61 11/26/2017 0838   Lab Results  Component Value Date   HGBA1C 5.5 11/26/2017   HGBA1C 5.7 (H) 08/05/2017   HGBA1C 5.7 (H) 04/15/2017   HGBA1C 5.6 02/07/2017   HGBA1C 5.9 (H) 01/16/2016   Lab Results  Component Value Date   INSULIN 22.8 11/26/2017   INSULIN 19.3 08/05/2017   INSULIN 18.2 04/15/2017   CBC    Component Value Date/Time   WBC 7.2 04/15/2017 0932   WBC 12.7 (H) 03/10/2016 0909   RBC 4.79 04/15/2017 0932   RBC 4.80 03/10/2016 0909   HGB 14.2 04/15/2017 0932   HCT 41.8 04/15/2017 0932   PLT 223 02/07/2017 1157   MCV 87 04/15/2017 0932   MCH 29.6 04/15/2017 0932   MCH 29.2 03/10/2016 0909   MCHC 34.0 04/15/2017 0932   MCHC  34.6 03/10/2016 0909   RDW 13.9 04/15/2017 0932   LYMPHSABS 2.5 04/15/2017 0932   MONOABS 1.0 03/10/2016 0909   EOSABS 0.1 04/15/2017 0932   BASOSABS 0.0 04/15/2017 0932   Iron/TIBC/Ferritin/ %Sat    Component Value Date/Time   FERRITIN 16 06/29/2009   Lipid Panel     Component Value Date/Time   CHOL 162 11/26/2017 0838   TRIG 165 (H) 11/26/2017 0838   HDL 46 11/26/2017 0838   CHOLHDL 3.5 11/26/2017 0838   CHOLHDL 3.5 01/31/2015 1129   VLDL 31 (H) 01/31/2015 1129   LDLCALC 83 11/26/2017 0838   Hepatic Function Panel     Component Value Date/Time   PROT 6.8 11/26/2017 0838   ALBUMIN 4.2 11/26/2017 0838   AST 16 11/26/2017 0838   ALT 32 11/26/2017 0838   ALKPHOS 87 11/26/2017 0838   BILITOT 0.4 11/26/2017 0838   BILIDIR 0.2 03/10/2016 0909   IBILI 0.7 03/10/2016 0909      Component Value Date/Time   TSH 2.430 04/15/2017 0932   TSH 2.710 02/07/2017 1157   TSH 3.310 01/16/2016 1141      OBESITY BEHAVIORAL INTERVENTION VISIT  Today's visit was # 21   Starting weight: 238 lbs Starting date: 04/15/17 Today's weight : N/A  Today's date: 04/07/2018 Total lbs lost to date: N/A    ASK: We discussed the diagnosis of obesity with Catherine Trevino today and Catherine Trevino agreed to give [place] permission to discuss obesity behavioral modification therapy today.  ASSESS: Catherine Trevino has the diagnosis of obesity and her BMI today is N/A Catherine Trevino is in the action stage of change   ADVISE: Catherine Trevino was educated on the multiple health risks of obesity as well as the benefit of weight loss to improve her health. She was advised of the need for long term treatment and the importance of lifestyle modifications to improve her current health and to decrease her risk of future health problems.  AGREE: Multiple dietary modification options and treatment options were discussed and  Keymora agreed to follow the recommendations documented in the above note.  ARRANGE: Catherine Trevino was educated  on the importance of frequent visits to treat obesity as outlined per CMS and USPSTF guidelines and agreed to  schedule her next follow up appointment today.  I, Catherine Trevino, am acting as transcriptionist for Ilene Qua, MD  I have reviewed the above documentation for accuracy and completeness, and I agree with the above. - Ilene Qua, MD

## 2018-04-22 ENCOUNTER — Other Ambulatory Visit: Payer: Self-pay

## 2018-04-22 ENCOUNTER — Encounter (INDEPENDENT_AMBULATORY_CARE_PROVIDER_SITE_OTHER): Payer: Self-pay | Admitting: Family Medicine

## 2018-04-22 ENCOUNTER — Ambulatory Visit (INDEPENDENT_AMBULATORY_CARE_PROVIDER_SITE_OTHER): Payer: BC Managed Care – PPO | Admitting: Family Medicine

## 2018-04-22 DIAGNOSIS — E559 Vitamin D deficiency, unspecified: Secondary | ICD-10-CM

## 2018-04-22 DIAGNOSIS — Z6841 Body Mass Index (BMI) 40.0 and over, adult: Secondary | ICD-10-CM | POA: Diagnosis not present

## 2018-04-22 DIAGNOSIS — R7303 Prediabetes: Secondary | ICD-10-CM

## 2018-04-22 MED ORDER — VITAMIN D (ERGOCALCIFEROL) 1.25 MG (50000 UNIT) PO CAPS: 50000.0000 [IU] | ORAL_CAPSULE | ORAL | 0 refills | Status: DC

## 2018-04-22 MED ORDER — METFORMIN HCL 500 MG PO TABS: 500.0000 mg | ORAL_TABLET | Freq: Every day | ORAL | 0 refills | Status: DC

## 2018-04-22 NOTE — Progress Notes (Signed)
Office: [phone]  /  Fax: [phone] TeleHealth Visit:  Catherine Trevino has verbally consented to this TeleHealth visit today. The patient is located at home, the provider is located at the [facility] office. The participants in this visit include the listed provider and patient and provider's assistant. The visit was conducted today via skype.  HPI:   Chief Complaint: OBESITY Season is here to discuss her progress with her obesity treatment plan. She is on the Category 3 plan and is following her eating plan approximately 95 % of the time. She states she is walking and doing yard work for 30-45 minutes 7 times per week. [name] is alternating between work and watching her granddaughter. Food wise, she feels limited which has been beneficial for her to limit indulgences.  We were unable to weigh the patient today for this TeleHealth visit. She feels as if she has lost 5 lbs since her last visit. She has lost 0 lbs since starting treatment with Catherine Trevino.  Pre-Diabetes Kashia has a diagnosis of pre-diabetes based on her elevated Hgb A1c and was informed this puts her at greater risk of developing diabetes. She notes minimal carbohydrate cravings and denies GI side effects of metformin. She continues to work on diet and exercise to decrease risk of diabetes. She denies hypoglycemia.  Vitamin D Deficiency Lilianna has a diagnosis of vitamin D deficiency. She is currently taking prescription Vit D. She notes fatigue and denies nausea, vomiting or muscle weakness.  ASSESSMENT AND PLAN:  Prediabetes - Plan: metFORMIN (GLUCOPHAGE) 500 MG tablet  Vitamin D deficiency - Plan: Vitamin D, Ergocalciferol, (DRISDOL) 1.25 MG (50000 UT) CAPS capsule  Class 3 severe obesity with serious comorbidity and body mass index (BMI) of 40.0 to 44.9 in adult, unspecified obesity type ([place])  PLAN:  Pre-Diabetes [name] will continue to work on weight loss, exercise, and decreasing simple  carbohydrates in her diet to help decrease the risk of diabetes. We dicussed metformin including benefits and risks. She was informed that eating too many simple carbohydrates or too many calories at one sitting increases the likelihood of GI side effects. [name] agrees to continue taking metformin 500 mg PO daily #30 and we will refill for 1 month. [name] agrees to follow up with our clinic in 2 weeks as directed to monitor her progress.  Vitamin D Deficiency [name] was informed that low vitamin D levels contributes to fatigue and are associated with obesity, breast, and colon cancer. [name] agrees to continue taking prescription Vit D @50 ,000 IU every week #4 and we will refill for 1 month. She will follow up for routine testing of vitamin D, at least 2-3 times per year. She was informed of the risk of over-replacement of vitamin D and agrees to not increase her dose unless she discusses this with Catherine Trevino first. [name] agrees to follow up with our clinic in 2 weeks.  Obesity [name] is currently in the action stage of change. As such, her goal is to continue with weight loss efforts She has agreed to follow the Category 3 plan [name] has been instructed to work up to a goal of 150 minutes of combined cardio and strengthening exercise per week for weight loss and overall health benefits. We discussed the following Behavioral Modification Strategies today: increasing lean protein intake, increasing vegetables, work on meal planning and easy cooking plans, avoiding temptations, better snacking choices, and planning for success   [name] has agreed to follow up with our clinic in 2  weeks. She was informed of the importance of frequent follow up visits to maximize her success with intensive lifestyle modifications for her multiple health conditions.  ALLERGIES: No Known Allergies  MEDICATIONS: Current Outpatient Medications on File Prior to Visit  Medication Sig Dispense Refill  .  buPROPion (WELLBUTRIN SR) 150 MG 12 hr tablet Take 1 tablet (150 mg total) by mouth daily. (Patient taking differently: Take 300 mg by mouth daily. ) 30 tablet 0  . lisinopril-hydrochlorothiazide (PRINZIDE,ZESTORETIC) 10-12.5 MG tablet TAKE 1 TABLET DAILY. 90 tablet 3  . Multiple Vitamins-Minerals (MULTIVITAMIN WOMEN 50+ PO) Take 1 tablet by mouth daily.    Marland Kitchen omega-3 acid ethyl esters (LOVAZA) 1 g capsule Take by mouth daily.    Marland Kitchen OVER THE COUNTER MEDICATION Take 1 capsule by mouth daily. Tumeric/Curcumin 500 mg taking     . polyethylene glycol powder (GLYCOLAX/MIRALAX) powder Take 17 g by mouth daily. Take every 12 hours 3350 g 0  . traZODone (DESYREL) 50 MG tablet Take 50 mg by mouth at bedtime.     No current facility-administered medications on file prior to visit.     PAST MEDICAL HISTORY: Past Medical History:  Diagnosis Date  . Anemia   . 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 Diverticulitis   . GERD (gastroesophageal reflux disease)   . Hx of adenomatous colonic polyps 12/29/2014  . Hypertension   . Obesity   . OSA (obstructive sleep apnea) 01/06/2016   Mild. CPAP discontinued 12/2016 following significant weight loss.   . OSA on CPAP 01/06/2016    PAST SURGICAL HISTORY: Past Surgical History:  Procedure Laterality Date  . ABDOMINAL HYSTERECTOMY    . COLONOSCOPY    . TUBAL LIGATION      SOCIAL HISTORY: Social History   Tobacco Use  . Smoking status: Never Smoker  . Smokeless tobacco: Never Used  Substance Use Topics  . Alcohol use: No    Alcohol/week: 0.0 standard drinks  . Drug use: No    FAMILY HISTORY: Family History  Problem Relation Age of Onset  . Hypertension Father   . Emphysema Maternal Grandmother   . Cancer Maternal Grandfather   . Hypertension Paternal Grandmother   . Cancer  Paternal Grandmother   . Diabetes Paternal Grandmother   . Hyperlipidemia Paternal Grandmother   . Hypertension Paternal Grandfather   . Diabetes Paternal Grandfather   . Hyperlipidemia Paternal Grandfather   . Colon cancer Neg Hx     ROS: Review of Systems  Constitutional: Positive for malaise/fatigue and weight loss.  Gastrointestinal: Negative for nausea and vomiting.  Musculoskeletal:       Negative muscle weakness  Endo/Heme/Allergies:       Negative hypoglycemia    PHYSICAL EXAM: Pt in no acute distress  RECENT LABS AND TESTS: BMET    Component Value Date/Time   NA 141 11/26/2017 0838   K 3.8 11/26/2017 0838   CL 106 11/26/2017 0838   CO2 21 11/26/2017 0838   GLUCOSE 117 (H) 11/26/2017 0838   GLUCOSE 138 (H) 03/10/2016 0919   BUN 21 11/26/2017 0838   CREATININE 1.15 (H) 11/26/2017 0838   CREATININE 0.86 01/31/2015 1129   CALCIUM 9.2 11/26/2017 0838   GFRNONAA 53 (L) 11/26/2017 0838   GFRAA 61 11/26/2017 0838   Lab Results  Component Value Date   HGBA1C  5.5 11/26/2017   HGBA1C 5.7 (H) 08/05/2017   HGBA1C 5.7 (H) 04/15/2017   HGBA1C 5.6 02/07/2017   HGBA1C 5.9 (H) 01/16/2016   Lab Results  Component Value Date   INSULIN 22.8 11/26/2017   INSULIN 19.3 08/05/2017   INSULIN 18.2 04/15/2017   CBC    Component Value Date/Time   WBC 7.2 04/15/2017 0932   WBC 12.7 (H) 03/10/2016 0909   RBC 4.79 04/15/2017 0932   RBC 4.80 03/10/2016 0909   HGB 14.2 04/15/2017 0932   HCT 41.8 04/15/2017 0932   PLT 223 02/07/2017 1157   MCV 87 04/15/2017 0932   MCH 29.6 04/15/2017 0932   MCH 29.2 03/10/2016 0909   MCHC 34.0 04/15/2017 0932   MCHC 34.6 03/10/2016 0909   RDW 13.9 04/15/2017 0932   LYMPHSABS 2.5 04/15/2017 0932   MONOABS 1.0 03/10/2016 0909   EOSABS 0.1 04/15/2017 0932   BASOSABS 0.0 04/15/2017 0932   Iron/TIBC/Ferritin/ %Sat    Component Value Date/Time   FERRITIN 16 06/29/2009   Lipid Panel     Component Value Date/Time   CHOL 162 11/26/2017  0838   TRIG 165 (H) 11/26/2017 0838   HDL 46 11/26/2017 0838   CHOLHDL 3.5 11/26/2017 0838   CHOLHDL 3.5 01/31/2015 1129   VLDL 31 (H) 01/31/2015 1129   LDLCALC 83 11/26/2017 0838   Hepatic Function Panel     Component Value Date/Time   PROT 6.8 11/26/2017 0838   ALBUMIN 4.2 11/26/2017 0838   AST 16 11/26/2017 0838   ALT 32 11/26/2017 0838   ALKPHOS 87 11/26/2017 0838   BILITOT 0.4 11/26/2017 0838   BILIDIR 0.2 03/10/2016 0909   IBILI 0.7 03/10/2016 0909      Component Value Date/Time   TSH 2.430 04/15/2017 0932   TSH 2.710 02/07/2017 1157   TSH 3.310 01/16/2016 1141      I, Trixie Dredge, am acting as transcriptionist for Ilene Qua, MD  I have reviewed the above documentation for accuracy and completeness, and I agree with the above. - Ilene Qua, MD

## 2018-05-05 ENCOUNTER — Other Ambulatory Visit: Payer: Self-pay

## 2018-05-05 ENCOUNTER — Ambulatory Visit (INDEPENDENT_AMBULATORY_CARE_PROVIDER_SITE_OTHER): Payer: BC Managed Care – PPO | Admitting: Family Medicine

## 2018-05-05 ENCOUNTER — Encounter (INDEPENDENT_AMBULATORY_CARE_PROVIDER_SITE_OTHER): Payer: Self-pay | Admitting: Family Medicine

## 2018-05-05 DIAGNOSIS — R7303 Prediabetes: Secondary | ICD-10-CM

## 2018-05-05 DIAGNOSIS — E559 Vitamin D deficiency, unspecified: Secondary | ICD-10-CM

## 2018-05-05 DIAGNOSIS — F3289 Other specified depressive episodes: Secondary | ICD-10-CM

## 2018-05-05 DIAGNOSIS — Z6841 Body Mass Index (BMI) 40.0 and over, adult: Secondary | ICD-10-CM

## 2018-05-05 MED ORDER — METFORMIN HCL 500 MG PO TABS: 500.0000 mg | ORAL_TABLET | Freq: Every day | ORAL | 0 refills | Status: DC

## 2018-05-05 MED ORDER — BUPROPION HCL ER (XL) 300 MG PO TB24: 300.0000 mg | ORAL_TABLET | Freq: Every day | ORAL | 0 refills | Status: DC

## 2018-05-06 MED ORDER — VITAMIN D (ERGOCALCIFEROL) 1.25 MG (50000 UNIT) PO CAPS: 50000.0000 [IU] | ORAL_CAPSULE | ORAL | 0 refills | Status: DC

## 2018-05-11 NOTE — Progress Notes (Signed)
Office: [phone]  /  Fax: [phone] TeleHealth Visit:  Catherine Trevino has verbally consented to this TeleHealth visit today. The patient is located at home, the provider is located at the [employer] [facility] office. The participants in this visit include the listed provider and patient. The visit was conducted today via webex.  HPI:   Chief Complaint: OBESITY Catherine Trevino is here to discuss her progress with her obesity treatment plan. She is on the Category 3 plan and is following her eating plan approximately 90 % of the time. She states she is walking for 30-45 minutes 7 times per week. Catherine Trevino has been trying to get out daily for activity, but she genuinely enjoys being at home. She is being conscious when she goes to the store to get food on the plan and minimizing indulgent eating.  We were unable to weigh the patient today for this TeleHealth visit. She feels as if she has lost 1/2 lb since her last visit. She has lost 0 lbs since starting treatment with [place].  Pre-Diabetes Catherine Trevino has a diagnosis of pre-diabetes based on her elevated Hgb A1c and was informed this puts her at greater risk of developing diabetes. She denies GI side effects of metformin or carbohydrate cravings. She continues to work on diet and exercise to decrease risk of diabetes. She denies nausea or hypoglycemia.  Vitamin D Deficiency Catherine Trevino has a diagnosis of vitamin D deficiency. She is currently taking prescription Vit D. She notes fatigue and denies nausea, vomiting or muscle weakness.  Depression with Emotional Eating Behaviors Catherine Trevino states her cravings are better controlled, and she denies feelings of elevated blood pressure, chest pain, chest pressure, or headaches. Catherine Trevino struggles with emotional eating and using food for comfort to the extent that it is negatively impacting her health. She often snacks when she is not hungry. Catherine Trevino sometimes feels she is out of control and then feels  guilty that she made poor food choices. She has been working on behavior modification techniques to help reduce her emotional eating and has been somewhat successful. She shows no sign of suicidal or homicidal ideations.  Depression screen [facility] 2/9 04/15/2017 02/07/2017 01/16/2016 10/25/2014  Decreased Interest 1 0 0 0  Down, Depressed, Hopeless 1 0 0 0  PHQ - 2 Score 2 0 0 0  Altered sleeping 1 - - -  Tired, decreased energy 1 - - -  Change in appetite 2 - - -  Feeling bad or failure about yourself  1 - - -  Trouble concentrating 1 - - -  Moving slowly or fidgety/restless 1 - - -  Suicidal thoughts 0 - - -  PHQ-9 Score 9 - - -  Difficult doing work/chores Not difficult at all - - -    ASSESSMENT AND PLAN:  Prediabetes - Plan: metFORMIN (GLUCOPHAGE) 500 MG tablet  Vitamin D deficiency - Plan: Vitamin D, Ergocalciferol, (DRISDOL) 1.25 MG (50000 UT) CAPS capsule  Other depression - with emotional eating  - Plan: buPROPion (WELLBUTRIN XL) 300 MG 24 hr tablet  Class 3 severe obesity with serious comorbidity and body mass index (BMI) of 40.0 to 44.9 in adult, unspecified obesity type (HCC)  PLAN:  Pre-Diabetes Catherine Trevino will continue to work on weight loss, exercise, and decreasing simple carbohydrates in her diet to help decrease the risk of diabetes. We dicussed metformin including benefits and risks. She was informed that eating too many simple carbohydrates or too many calories at one sitting increases the likelihood of  GI side effects. Catherine Trevino agrees to continue taking metformin 500 mg PO q AM #30 and we will refill for 1 month. Catherine Trevino agrees to follow up with our clinic in 2 weeks as directed to monitor her progress.  Vitamin D Deficiency Catherine Trevino was informed that low vitamin D levels contributes to fatigue and are associated with obesity, breast, and colon cancer. Catherine Trevino agrees to continue taking prescription Vit D @50 ,000 IU every week #4 and we will refill for 1 month. She will  follow up for routine testing of vitamin D, at least 2-3 times per year. She was informed of the risk of over-replacement of vitamin D and agrees to not increase her dose unless she discusses this with [place] first. Catherine Trevino agrees to follow up with our clinic in 2 weeks.  Depression with Emotional Eating Behaviors We discussed behavior modification techniques today to help Catherine Trevino deal with her emotional eating and depression. Catherine Trevino agrees to continue taking Wellbutrin XL 300 mg 1 tablet PO daily #30 and we will refill for 1 month. Catherine Trevino agrees to follow up with our clinic in 2 weeks.  Obesity Catherine Trevino is currently in the action stage of change. As such, her goal is to continue with weight loss efforts She has agreed to follow the Category 3 plan Catherine Trevino has been instructed to work up to a goal of 150 minutes of combined cardio and strengthening exercise per week for weight loss and overall health benefits. We discussed the following Behavioral Modification Strategies today: increasing lean protein intake and work on meal planning and easy cooking plans, better snacking choices, and planning for success   Catherine Trevino has agreed to follow up with our clinic in 2 weeks. She was informed of the importance of frequent follow up visits to maximize her success with intensive lifestyle modifications for her multiple health conditions.  ALLERGIES: No Known Allergies  MEDICATIONS: Current Outpatient Medications on File Prior to Visit  Medication Sig Dispense Refill  . lisinopril-hydrochlorothiazide (PRINZIDE,ZESTORETIC) 10-12.5 MG tablet TAKE 1 TABLET DAILY. 90 tablet 3  . Multiple Vitamins-Minerals (MULTIVITAMIN WOMEN 50+ PO) Take 1 tablet by mouth daily.    Marland Kitchen omega-3 acid ethyl esters (LOVAZA) 1 g capsule Take by mouth daily.    Marland Kitchen OVER THE COUNTER MEDICATION Take 1 capsule by mouth daily. Tumeric/Curcumin 500 mg taking     . polyethylene glycol powder (GLYCOLAX/MIRALAX) powder Take 17 g by mouth  daily. Take every 12 hours 3350 g 0  . traZODone (DESYREL) 50 MG tablet Take 50 mg by mouth at bedtime.     No current facility-administered medications on file prior to visit.     PAST MEDICAL HISTORY: Past Medical History:  Diagnosis Date  . Anemia   . 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 Diverticulitis   . GERD (gastroesophageal reflux disease)   . Hx of adenomatous colonic polyps 12/29/2014  . Hypertension   . Obesity   . OSA (obstructive sleep apnea) 01/06/2016   Mild. CPAP discontinued 12/2016 following significant weight loss.   . OSA on CPAP 01/06/2016    PAST SURGICAL HISTORY: Past Surgical History:  Procedure Laterality Date  . ABDOMINAL HYSTERECTOMY    . COLONOSCOPY    . TUBAL LIGATION      SOCIAL HISTORY: Social History   Tobacco Use  . Smoking status: Never Smoker  .  Smokeless tobacco: Never Used  Substance Use Topics  . Alcohol use: No    Alcohol/week: 0.0 standard drinks  . Drug use: No    FAMILY HISTORY: Family History  Problem Relation Age of Onset  . Hypertension Father   . Emphysema Maternal Grandmother   . Cancer Maternal Grandfather   . Hypertension Paternal Grandmother   . Cancer Paternal Grandmother   . Diabetes Paternal Grandmother   . Hyperlipidemia Paternal Grandmother   . Hypertension Paternal Grandfather   . Diabetes Paternal Grandfather   . Hyperlipidemia Paternal Grandfather   . Colon cancer Neg Hx     ROS: Review of Systems  Constitutional: Positive for malaise/fatigue and weight loss.  Cardiovascular: Negative for chest pain.       Negative chest pressure  Gastrointestinal: Negative for nausea and vomiting.  Musculoskeletal:       Negative muscle weakness  Neurological: Negative for headaches.  Endo/Heme/Allergies:       Negative hypoglycemia   Psychiatric/Behavioral: Positive for depression. Negative for suicidal ideas.    PHYSICAL EXAM: Pt in no acute distress  RECENT LABS AND TESTS: BMET    Component Value Date/Time   NA 141 11/26/2017 0838   K 3.8 11/26/2017 0838   CL 106 11/26/2017 0838   CO2 21 11/26/2017 0838   GLUCOSE 117 (H) 11/26/2017 0838   GLUCOSE 138 (H) 03/10/2016 0919   BUN 21 11/26/2017 0838   CREATININE 1.15 (H) 11/26/2017 0838   CREATININE 0.86 01/31/2015 1129   CALCIUM 9.2 11/26/2017 0838   GFRNONAA 53 (L) 11/26/2017 0838   GFRAA 61 11/26/2017 0838   Lab Results  Component Value Date   HGBA1C 5.5 11/26/2017   HGBA1C 5.7 (H) 08/05/2017   HGBA1C 5.7 (H) 04/15/2017   HGBA1C 5.6 02/07/2017   HGBA1C 5.9 (H) 01/16/2016   Lab Results  Component Value Date   INSULIN 22.8 11/26/2017   INSULIN 19.3 08/05/2017   INSULIN 18.2 04/15/2017   CBC    Component Value Date/Time   WBC 7.2 04/15/2017 0932   WBC 12.7 (H) 03/10/2016 0909   RBC 4.79 04/15/2017 0932   RBC 4.80 03/10/2016 0909   HGB 14.2 04/15/2017 0932   HCT 41.8 04/15/2017 0932   PLT 223 02/07/2017 1157   MCV 87 04/15/2017 0932   MCH 29.6 04/15/2017 0932   MCH 29.2 03/10/2016 0909   MCHC 34.0 04/15/2017 0932   MCHC 34.6 03/10/2016 0909   RDW 13.9 04/15/2017 0932   LYMPHSABS 2.5 04/15/2017 0932   MONOABS 1.0 03/10/2016 0909   EOSABS 0.1 04/15/2017 0932   BASOSABS 0.0 04/15/2017 0932   Iron/TIBC/Ferritin/ %Sat    Component Value Date/Time   FERRITIN 16 06/29/2009   Lipid Panel     Component Value Date/Time   CHOL 162 11/26/2017 0838   TRIG 165 (H) 11/26/2017 0838   HDL 46 11/26/2017 0838   CHOLHDL 3.5 11/26/2017 0838   CHOLHDL 3.5 01/31/2015 1129   VLDL 31 (H) 01/31/2015 1129   LDLCALC 83 11/26/2017 0838   Hepatic Function Panel     Component Value Date/Time   PROT 6.8 11/26/2017 0838   ALBUMIN 4.2 11/26/2017 0838   AST 16 11/26/2017 0838   ALT 32 11/26/2017 0838   ALKPHOS 87 11/26/2017 0838   BILITOT 0.4  11/26/2017 0838   BILIDIR 0.2 03/10/2016 0909   IBILI 0.7 03/10/2016 0909      Component Value Date/Time   TSH 2.430 04/15/2017 0932   TSH 2.710 02/07/2017 1157   TSH  3.310 01/16/2016 1141      I, Trixie Dredge, am acting as transcriptionist for Ilene Qua, MD  I have reviewed the above documentation for accuracy and completeness, and I agree with the above. - Ilene Qua, MD

## 2018-05-19 ENCOUNTER — Encounter (INDEPENDENT_AMBULATORY_CARE_PROVIDER_SITE_OTHER): Payer: Self-pay | Admitting: Family Medicine

## 2018-05-19 ENCOUNTER — Other Ambulatory Visit: Payer: Self-pay

## 2018-05-19 ENCOUNTER — Ambulatory Visit (INDEPENDENT_AMBULATORY_CARE_PROVIDER_SITE_OTHER): Payer: BC Managed Care – PPO | Admitting: Family Medicine

## 2018-05-19 DIAGNOSIS — I10 Essential (primary) hypertension: Secondary | ICD-10-CM

## 2018-05-19 DIAGNOSIS — R7303 Prediabetes: Secondary | ICD-10-CM

## 2018-05-19 DIAGNOSIS — Z6841 Body Mass Index (BMI) 40.0 and over, adult: Secondary | ICD-10-CM

## 2018-05-19 MED ORDER — METFORMIN HCL 500 MG PO TABS: 500.0000 mg | ORAL_TABLET | Freq: Every day | ORAL | 0 refills | Status: DC

## 2018-05-20 NOTE — Progress Notes (Signed)
Office: [phone]  /  Fax: [phone] TeleHealth Visit:  Catherine Trevino has verbally consented to this TeleHealth visit today. The patient is located at home, the provider is located at the [employer] [facility] office. The participants in this visit include the listed provider and patient. The visit was conducted today via webex.  HPI:   Chief Complaint: OBESITY Catherine Trevino is here to discuss her progress with her obesity treatment plan. She is on the Category 3 plan and is following her eating plan approximately 85 % of the time. She states she is exercising 0 minutes 0 times per week. Catherine Trevino injured her back last week cleaning last week. She has had to take Catherine Trevino almost daily. She has had 2 stressful episodes in the past 2 weeks, back injury and getting a job offer rescinded. She is not doing out of control snacking.  We were unable to weigh the patient today for this TeleHealth visit. She feels as if she has maintained her weight since her last visit. She has lost 0 lbs since starting treatment with [place].  Pre-Diabetes Catherine Trevino has a diagnosis of pre-diabetes based on her elevated Hgb A1c of 5.7 and insulin of 22.8 on most recent labs (November 2019). She was informed this puts her at greater risk of developing diabetes. She notes occasional carbohydrate cravings. She is taking metformin currently and continues to work on diet and exercise to decrease risk of diabetes. She denies nausea or hypoglycemia.  Hypertension Catherine Trevino is a 57 y.o. female with hypertension. Catherine Trevino's blood pressure is controlled. She denies chest pain, chest pressure, or headaches. She is on Prinzide. She is working weight loss to help control her blood pressure with the goal of decreasing her risk of heart attack and stroke.   ASSESSMENT AND PLAN:  Prediabetes - Plan: metFORMIN (GLUCOPHAGE) 500 MG tablet  Essential hypertension  Class 3 severe obesity with serious comorbidity and body mass  index (BMI) of 40.0 to 44.9 in adult, unspecified obesity type ([place])  PLAN:  Pre-Diabetes Catherine Trevino will continue to work on weight loss, exercise, and decreasing simple carbohydrates in her diet to help decrease the risk of diabetes. We dicussed metformin including benefits and risks. She was informed that eating too many simple carbohydrates or too many calories at one sitting increases the likelihood of GI side effects. Catherine Trevino agrees to continue taking meformin 500 mg PO q AM #30 and we will refill for 1 month. Catherine Trevino agrees to follow up with our clinic in 2 weeks as directed to monitor her progress.  Hypertension We discussed sodium restriction, working on healthy weight loss, and a regular exercise program as the means to achieve improved blood pressure control. Catherine Trevino agreed with this plan and agreed to follow up as directed. We will continue to monitor her blood pressure as well as her progress with the above lifestyle modifications. Catherine Trevino agrees to continue her current medications and will watch for signs of hypotension as she continues her lifestyle modifications. Catherine Trevino agrees to follow up with our clinic in 2 weeks.  Obesity Catherine Trevino is currently in the action stage of change. As such, her goal is to continue with weight loss efforts She has agreed to follow the Category 3 plan Catherine Trevino has been instructed to work up to a goal of 150 minutes of combined cardio and strengthening exercise per week or she is to start walking for 15-30 minutes 2-3 times per week for weight loss and overall health benefits. We discussed the following  Behavioral Modification Strategies today: increasing lean protein intake, increasing vegetables and work on meal planning and easy cooking plans, keeping healthy foods in the home, and planning for success   Catherine Trevino has agreed to follow up with our clinic in 2 weeks. She was informed of the importance of frequent follow up visits to maximize her  success with intensive lifestyle modifications for her multiple health conditions.  ALLERGIES: No Known Allergies  MEDICATIONS: Current Outpatient Medications on File Prior to Visit  Medication Sig Dispense Refill  . buPROPion (WELLBUTRIN XL) 300 MG 24 hr tablet Take 1 tablet (300 mg total) by mouth daily. 30 tablet 0  . lisinopril-hydrochlorothiazide (PRINZIDE,ZESTORETIC) 10-12.5 MG tablet TAKE 1 TABLET DAILY. 90 tablet 3  . Multiple Vitamins-Minerals (MULTIVITAMIN WOMEN 50+ PO) Take 1 tablet by mouth daily.    Marland Kitchen omega-3 acid ethyl esters (LOVAZA) 1 g capsule Take by mouth daily.    Marland Kitchen OVER THE COUNTER MEDICATION Take 1 capsule by mouth daily. Tumeric/Curcumin 500 mg taking     . polyethylene glycol powder (GLYCOLAX/MIRALAX) powder Take 17 g by mouth daily. Take every 12 hours 3350 g 0  . traZODone (DESYREL) 50 MG tablet Take 50 mg by mouth at bedtime.    . Vitamin D, Ergocalciferol, (DRISDOL) 1.25 MG (50000 UT) CAPS capsule Take 1 capsule (50,000 Units total) by mouth every 7 (seven) days. 4 capsule 0   No current facility-administered medications on file prior to visit.     PAST MEDICAL HISTORY: Past Medical History:  Diagnosis Date  . Anemia   . 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 Diverticulitis   . GERD (gastroesophageal reflux disease)   . Hx of adenomatous colonic polyps 12/29/2014  . Hypertension   . Obesity   . OSA (obstructive sleep apnea) 01/06/2016   Mild. CPAP discontinued 12/2016 following significant weight loss.   . OSA on CPAP 01/06/2016    PAST SURGICAL HISTORY: Past Surgical History:  Procedure Laterality Date  . ABDOMINAL HYSTERECTOMY    . COLONOSCOPY    . TUBAL LIGATION      SOCIAL HISTORY: Social History   Tobacco Use  . Smoking status: Never Smoker  . Smokeless tobacco: Never Used   Substance Use Topics  . Alcohol use: No    Alcohol/week: 0.0 standard drinks  . Drug use: No    FAMILY HISTORY: Family History  Problem Relation Age of Onset  . Hypertension Father   . Emphysema Maternal Grandmother   . Cancer Maternal Grandfather   . Hypertension Paternal Grandmother   . Cancer Paternal Grandmother   . Diabetes Paternal Grandmother   . Hyperlipidemia Paternal Grandmother   . Hypertension Paternal Grandfather   . Diabetes Paternal Grandfather   . Hyperlipidemia Paternal Grandfather   . Colon cancer Neg Hx     ROS: Review of Systems  Constitutional: Negative for weight loss.  Cardiovascular: Negative for chest pain.       Negative chest pressure  Gastrointestinal: Negative for nausea.  Neurological: Negative for headaches.  Endo/Heme/Allergies:       Negative hypoglycemia    PHYSICAL EXAM: Pt in no acute distress  RECENT LABS AND TESTS: BMET    Component Value Date/Time   NA 141 11/26/2017 0838   K 3.8 11/26/2017 0838   CL 106 11/26/2017 0838   CO2 21 11/26/2017  1610   GLUCOSE 117 (H) 11/26/2017 0838   GLUCOSE 138 (H) 03/10/2016 0919   BUN 21 11/26/2017 0838   CREATININE 1.15 (H) 11/26/2017 0838   CREATININE 0.86 01/31/2015 1129   CALCIUM 9.2 11/26/2017 0838   GFRNONAA 53 (L) 11/26/2017 0838   GFRAA 61 11/26/2017 0838   Lab Results  Component Value Date   HGBA1C 5.5 11/26/2017   HGBA1C 5.7 (H) 08/05/2017   HGBA1C 5.7 (H) 04/15/2017   HGBA1C 5.6 02/07/2017   HGBA1C 5.9 (H) 01/16/2016   Lab Results  Component Value Date   INSULIN 22.8 11/26/2017   INSULIN 19.3 08/05/2017   INSULIN 18.2 04/15/2017   CBC    Component Value Date/Time   WBC 7.2 04/15/2017 0932   WBC 12.7 (H) 03/10/2016 0909   RBC 4.79 04/15/2017 0932   RBC 4.80 03/10/2016 0909   HGB 14.2 04/15/2017 0932   HCT 41.8 04/15/2017 0932   PLT 223 02/07/2017 1157   MCV 87 04/15/2017 0932   MCH 29.6 04/15/2017 0932   MCH 29.2 03/10/2016 0909   MCHC 34.0 04/15/2017 0932    MCHC 34.6 03/10/2016 0909   RDW 13.9 04/15/2017 0932   LYMPHSABS 2.5 04/15/2017 0932   MONOABS 1.0 03/10/2016 0909   EOSABS 0.1 04/15/2017 0932   BASOSABS 0.0 04/15/2017 0932   Iron/TIBC/Ferritin/ %Sat    Component Value Date/Time   FERRITIN 16 06/29/2009   Lipid Panel     Component Value Date/Time   CHOL 162 11/26/2017 0838   TRIG 165 (H) 11/26/2017 0838   HDL 46 11/26/2017 0838   CHOLHDL 3.5 11/26/2017 0838   CHOLHDL 3.5 01/31/2015 1129   VLDL 31 (H) 01/31/2015 1129   LDLCALC 83 11/26/2017 0838   Hepatic Function Panel     Component Value Date/Time   PROT 6.8 11/26/2017 0838   ALBUMIN 4.2 11/26/2017 0838   AST 16 11/26/2017 0838   ALT 32 11/26/2017 0838   ALKPHOS 87 11/26/2017 0838   BILITOT 0.4 11/26/2017 0838   BILIDIR 0.2 03/10/2016 0909   IBILI 0.7 03/10/2016 0909      Component Value Date/Time   TSH 2.430 04/15/2017 0932   TSH 2.710 02/07/2017 1157   TSH 3.310 01/16/2016 1141      I, Trixie Dredge, am acting as transcriptionist for Ilene Qua, MD   I have reviewed the above documentation for accuracy and completeness, and I agree with the above. - Ilene Qua, MD

## 2018-06-02 ENCOUNTER — Ambulatory Visit (INDEPENDENT_AMBULATORY_CARE_PROVIDER_SITE_OTHER): Payer: BC Managed Care – PPO | Admitting: Family Medicine

## 2018-06-02 ENCOUNTER — Encounter (INDEPENDENT_AMBULATORY_CARE_PROVIDER_SITE_OTHER): Payer: Self-pay | Admitting: Family Medicine

## 2018-06-02 ENCOUNTER — Other Ambulatory Visit: Payer: Self-pay

## 2018-06-02 DIAGNOSIS — Z6841 Body Mass Index (BMI) 40.0 and over, adult: Secondary | ICD-10-CM

## 2018-06-02 DIAGNOSIS — F3289 Other specified depressive episodes: Secondary | ICD-10-CM | POA: Diagnosis not present

## 2018-06-02 DIAGNOSIS — E559 Vitamin D deficiency, unspecified: Secondary | ICD-10-CM

## 2018-06-02 MED ORDER — BUPROPION HCL ER (XL) 300 MG PO TB24: 300.0000 mg | ORAL_TABLET | Freq: Every day | ORAL | 0 refills | Status: DC

## 2018-06-02 MED ORDER — VITAMIN D (ERGOCALCIFEROL) 1.25 MG (50000 UNIT) PO CAPS: 50000.0000 [IU] | ORAL_CAPSULE | ORAL | 0 refills | Status: DC

## 2018-06-02 NOTE — Progress Notes (Signed)
Office: [phone]  /  Fax: [phone] TeleHealth Visit:  Catherine Trevino has verbally consented to this TeleHealth visit today. The patient is located at home, the provider is located at the [facility] office. The participants in this visit include the listed provider and patient. The visit was conducted today via webex.  HPI:   Chief Complaint: OBESITY Catherine Trevino is here to discuss her progress with her obesity treatment plan. She is on the Category 3 plan and is following her eating plan approximately 0 % of the time. She states she is walking for 30 minutes 2-3 times per week. [name] maintained at 232 lbs. She has been wearing a back brace at home secondary to recent back strain. She has no difficulty finding any of the food. She has had less of an appetite with back injury.  We were unable to weigh the patient today for this TeleHealth visit. She feels as if she has maintained her weight since her last visit. She has lost 0 lbs since starting treatment with [place].  Vitamin D Deficiency Catherine Trevino has a diagnosis of vitamin D deficiency. She is currently taking prescription Vit D. She notes fatigue and denies nausea, vomiting or muscle weakness.  Depression with Emotional Eating Behaviors Catherine Trevino denies side effects of Wellbutrin. Her blood pressure was previously well controlled. Catherine Trevino struggles with emotional eating and using food for comfort to the extent that it is negatively impacting her health. She often snacks when she is not hungry. Catherine Trevino sometimes feels she is out of control and then feels guilty that she made poor food choices. She has been working on behavior modification techniques to help reduce her emotional eating and has been somewhat successful. She shows no sign of suicidal or homicidal ideations.  Depression screen Catherine Trevino 2/9 04/15/2017 02/07/2017 01/16/2016 10/25/2014  Decreased Interest 1 0 0 0  Down, Depressed, Hopeless 1 0 0 0  PHQ - 2 Score 2 0 0 0   Altered sleeping 1 - - -  Tired, decreased energy 1 - - -  Change in appetite 2 - - -  Feeling bad or failure about yourself  1 - - -  Trouble concentrating 1 - - -  Moving slowly or fidgety/restless 1 - - -  Suicidal thoughts 0 - - -  PHQ-9 Score 9 - - -  Difficult doing work/chores Not difficult at all - - -    ASSESSMENT AND PLAN:  Vitamin D deficiency - Plan: Vitamin D, Ergocalciferol, (DRISDOL) 1.25 MG (50000 UT) CAPS capsule  Other depression - with emotional eating  - Plan: buPROPion (WELLBUTRIN XL) 300 MG 24 hr tablet  Class 3 severe obesity with serious comorbidity and body mass index (BMI) of 40.0 to 44.9 in adult, unspecified obesity type (HCC)  PLAN:  Vitamin D Deficiency Catherine Trevino was informed that low vitamin D levels contributes to fatigue and are associated with obesity, breast, and colon cancer. [name] agrees to continue taking prescription Vit D @50 ,000 IU every week #4 and we will refill for 1 month. She will follow up for routine testing of vitamin D, at least 2-3 times per year. She was informed of the risk of over-replacement of vitamin D and agrees to not increase her dose unless she discusses this with [place] first. Catherine Trevino agrees to follow up with our clinic in 2 weeks.  Depression with Emotional Eating Behaviors We discussed behavior modification techniques today to help Catherine Trevino deal with her emotional eating and depression. Catherine Trevino agrees to continue  taking Wellbutrin XL 300 mg PO daily #30 and we will refill for 1 month. Catherine Trevino agrees to follow up with our clinic in 2 weeks.  Obesity Catherine Trevino is currently in the action stage of change. As such, her goal is to continue with weight loss efforts She has agreed to follow the Category 3 plan [name] has been instructed to work up to a goal of 150 minutes of combined cardio and strengthening exercise per week for weight loss and overall health benefits. We discussed the following Behavioral Modification  Strategies today: increasing lean protein intake, increasing vegetables and work on meal planning and easy cooking plans, keeping healthy foods in the home, and planning for success   Catherine Trevino has agreed to follow up with our clinic in 2 weeks. She was informed of the importance of frequent follow up visits to maximize her success with intensive lifestyle modifications for her multiple health conditions.  ALLERGIES: No Known Allergies  MEDICATIONS: Current Outpatient Medications on File Prior to Visit  Medication Sig Dispense Refill  . lisinopril-hydrochlorothiazide (PRINZIDE,ZESTORETIC) 10-12.5 MG tablet TAKE 1 TABLET DAILY. 90 tablet 3  . metFORMIN (GLUCOPHAGE) 500 MG tablet Take 1 tablet (500 mg total) by mouth daily with breakfast. 30 tablet 0  . Multiple Vitamins-Minerals (MULTIVITAMIN WOMEN 50+ PO) Take 1 tablet by mouth daily.    Catherine Trevino Kitchen omega-3 acid ethyl esters (LOVAZA) 1 g capsule Take by mouth daily.    Catherine Trevino Kitchen OVER THE COUNTER MEDICATION Take 1 capsule by mouth daily. Tumeric/Curcumin 500 mg taking     . polyethylene glycol powder (GLYCOLAX/MIRALAX) powder Take 17 g by mouth daily. Take every 12 hours 3350 g 0  . traZODone (DESYREL) 50 MG tablet Take 50 mg by mouth at bedtime.     No current facility-administered medications on file prior to visit.     PAST MEDICAL HISTORY: Past Medical History:  Diagnosis Date  . Anemia   . 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 Diverticulitis   . GERD (gastroesophageal reflux disease)   . Hx of adenomatous colonic polyps 12/29/2014  . Hypertension   . Obesity   . OSA (obstructive sleep apnea) 01/06/2016   Mild. CPAP discontinued 12/2016 following significant weight loss.   . OSA on CPAP 01/06/2016    PAST SURGICAL HISTORY: Past Surgical History:  Procedure Laterality Date  .  ABDOMINAL HYSTERECTOMY    . COLONOSCOPY    . TUBAL LIGATION      SOCIAL HISTORY: Social History   Tobacco Use  . Smoking status: Never Smoker  . Smokeless tobacco: Never Used  Substance Use Topics  . Alcohol use: No    Alcohol/week: 0.0 standard drinks  . Drug use: No    FAMILY HISTORY: Family History  Problem Relation Age of Onset  . Hypertension Father   . Emphysema Maternal Grandmother   . Cancer Maternal Grandfather   . Hypertension Paternal Grandmother   . Cancer Paternal Grandmother   . Diabetes Paternal Grandmother   . Hyperlipidemia Paternal Grandmother   . Hypertension Paternal Grandfather   . Diabetes Paternal Grandfather   . Hyperlipidemia Paternal Grandfather   . Colon cancer Neg Hx     ROS: Review of Systems  Constitutional: Positive for malaise/fatigue. Negative for weight loss.  Gastrointestinal: Negative for nausea and vomiting.  Musculoskeletal: Positive for back pain.  Negative muscle weakness  Psychiatric/Behavioral: Positive for depression. Negative for suicidal ideas.    PHYSICAL EXAM: Pt in no acute distress  RECENT LABS AND TESTS: BMET    Component Value Date/Time   NA 141 11/26/2017 0838   K 3.8 11/26/2017 0838   CL 106 11/26/2017 0838   CO2 21 11/26/2017 0838   GLUCOSE 117 (H) 11/26/2017 0838   GLUCOSE 138 (H) 03/10/2016 0919   BUN 21 11/26/2017 0838   CREATININE 1.15 (H) 11/26/2017 0838   CREATININE 0.86 01/31/2015 1129   CALCIUM 9.2 11/26/2017 0838   GFRNONAA 53 (L) 11/26/2017 0838   GFRAA 61 11/26/2017 0838   Lab Results  Component Value Date   HGBA1C 5.5 11/26/2017   HGBA1C 5.7 (H) 08/05/2017   HGBA1C 5.7 (H) 04/15/2017   HGBA1C 5.6 02/07/2017   HGBA1C 5.9 (H) 01/16/2016   Lab Results  Component Value Date   INSULIN 22.8 11/26/2017   INSULIN 19.3 08/05/2017   INSULIN 18.2 04/15/2017   CBC    Component Value Date/Time   WBC 7.2 04/15/2017 0932   WBC 12.7 (H) 03/10/2016 0909   RBC 4.79 04/15/2017 0932    RBC 4.80 03/10/2016 0909   HGB 14.2 04/15/2017 0932   HCT 41.8 04/15/2017 0932   PLT 223 02/07/2017 1157   MCV 87 04/15/2017 0932   MCH 29.6 04/15/2017 0932   MCH 29.2 03/10/2016 0909   MCHC 34.0 04/15/2017 0932   MCHC 34.6 03/10/2016 0909   RDW 13.9 04/15/2017 0932   LYMPHSABS 2.5 04/15/2017 0932   MONOABS 1.0 03/10/2016 0909   EOSABS 0.1 04/15/2017 0932   BASOSABS 0.0 04/15/2017 0932   Iron/TIBC/Ferritin/ %Sat    Component Value Date/Time   FERRITIN 16 06/29/2009   Lipid Panel     Component Value Date/Time   CHOL 162 11/26/2017 0838   TRIG 165 (H) 11/26/2017 0838   HDL 46 11/26/2017 0838   CHOLHDL 3.5 11/26/2017 0838   CHOLHDL 3.5 01/31/2015 1129   VLDL 31 (H) 01/31/2015 1129   LDLCALC 83 11/26/2017 0838   Hepatic Function Panel     Component Value Date/Time   PROT 6.8 11/26/2017 0838   ALBUMIN 4.2 11/26/2017 0838   AST 16 11/26/2017 0838   ALT 32 11/26/2017 0838   ALKPHOS 87 11/26/2017 0838   BILITOT 0.4 11/26/2017 0838   BILIDIR 0.2 03/10/2016 0909   IBILI 0.7 03/10/2016 0909      Component Value Date/Time   TSH 2.430 04/15/2017 0932   TSH 2.710 02/07/2017 1157   TSH 3.310 01/16/2016 1141      I, Trixie Dredge, am acting as transcriptionist for Ilene Qua, MD  I have reviewed the above documentation for accuracy and completeness, and I agree with the above. - Ilene Qua, MD

## 2018-06-16 ENCOUNTER — Ambulatory Visit (INDEPENDENT_AMBULATORY_CARE_PROVIDER_SITE_OTHER): Payer: BC Managed Care – PPO | Admitting: Family Medicine

## 2018-06-16 ENCOUNTER — Other Ambulatory Visit: Payer: Self-pay

## 2018-06-16 ENCOUNTER — Encounter (INDEPENDENT_AMBULATORY_CARE_PROVIDER_SITE_OTHER): Payer: Self-pay | Admitting: Family Medicine

## 2018-06-16 DIAGNOSIS — E559 Vitamin D deficiency, unspecified: Secondary | ICD-10-CM

## 2018-06-16 DIAGNOSIS — R7303 Prediabetes: Secondary | ICD-10-CM | POA: Diagnosis not present

## 2018-06-16 DIAGNOSIS — Z6841 Body Mass Index (BMI) 40.0 and over, adult: Secondary | ICD-10-CM

## 2018-06-16 MED ORDER — METFORMIN HCL 500 MG PO TABS: 500.0000 mg | ORAL_TABLET | Freq: Every day | ORAL | 0 refills | Status: DC

## 2018-06-16 MED ORDER — VITAMIN D (ERGOCALCIFEROL) 1.25 MG (50000 UNIT) PO CAPS: 50000.0000 [IU] | ORAL_CAPSULE | ORAL | 0 refills | Status: DC

## 2018-06-16 NOTE — Progress Notes (Signed)
Office: [phone]  /  Fax: [phone] TeleHealth Visit:  Catherine Trevino has verbally consented to this TeleHealth visit today. The patient is located at home, the provider is located at the [facility] office. The participants in this visit include the listed provider and patient. The visit was conducted today via webex.  HPI:   Chief Complaint: OBESITY Catherine Trevino is here to discuss her progress with her obesity treatment plan. She is on the Category 3 plan and is following her eating plan approximately 90 % of the time. She states she is walking for 30 minutes 3 times per week. Catherine Trevino's weight is of 231.5 lbs today. She has done well with consistency on the meal plan, secondary to needing more structure and not having to think as much about what she is eating. She denies hunger.  We were unable to weigh the patient today for this TeleHealth visit. She feels as if she has lost a half of lb since her last visit. She has lost 0 lbs since starting treatment with [place].  Vitamin D Deficiency [name] has a diagnosis of vitamin D deficiency. She is currently taking prescription Vit D. She notes fatigue and denies nausea, vomiting or muscle weakness.  Pre-Diabetes [name] has a diagnosis of pre-diabetes based on her elevated Hgb A1c and was informed this puts her at greater risk of developing diabetes. She denies GI side effects of metformin and notes improved sugar cravings. She continues to work on diet and exercise to decrease risk of diabetes. She denies hypoglycemia.  ASSESSMENT AND PLAN:  Vitamin D deficiency - Plan: Vitamin D, Ergocalciferol, (DRISDOL) 1.25 MG (50000 UT) CAPS capsule  Prediabetes - Plan: metFORMIN (GLUCOPHAGE) 500 MG tablet  Class 3 severe obesity with serious comorbidity and body mass index (BMI) of 40.0 to 44.9 in adult, unspecified obesity type ([place])  PLAN:  Vitamin D Deficiency [name] was informed that low vitamin D levels contributes to  fatigue and are associated with obesity, breast, and colon cancer. [name] agrees to continue taking prescription Vit D @50 ,000 IU every week #4 and we will refill for 1 month. She will follow up for routine testing of vitamin D, at least 2-3 times per year. She was informed of the risk of over-replacement of vitamin D and agrees to not increase her dose unless she discusses this with [place] first. [name] agrees to follow up with our clinic in 2 weeks.  Pre-Diabetes [name] will continue to work on weight loss, exercise, and decreasing simple carbohydrates in her diet to help decrease the risk of diabetes. We dicussed metformin including benefits and risks. She was informed that eating too many simple carbohydrates or too many calories at one sitting increases the likelihood of GI side effects. [name] agrees to continue taking metformin 500 mg PO q AM #30 and we will refill for 1 month. [name] agrees to follow up with our clinic in 2 weeks as directed to monitor her progress.  Obesity [name] is currently in the action stage of change. As such, her goal is to continue with weight loss efforts She has agreed to follow the Category 3 plan [name] has been instructed to work up to a goal of 150 minutes of combined cardio and strengthening exercise per week or continue physical activity of walking for weight loss and overall health benefits. We discussed the following Behavioral Modification Strategies today: increasing lean protein intake, increasing vegetables and work on meal planning and easy cooking plans, keeping healthy foods in the  home, better snacking choices, and planning for success   [name] has agreed to follow up with our clinic in 2 weeks. She was informed of the importance of frequent follow up visits to maximize her success with intensive lifestyle modifications for her multiple health conditions.  ALLERGIES: No Known Allergies  MEDICATIONS: Current Outpatient Medications on  File Prior to Visit  Medication Sig Dispense Refill  . buPROPion (WELLBUTRIN XL) 300 MG 24 hr tablet Take 1 tablet (300 mg total) by mouth daily. 30 tablet 0  . lisinopril-hydrochlorothiazide (PRINZIDE,ZESTORETIC) 10-12.5 MG tablet TAKE 1 TABLET DAILY. 90 tablet 3  . metFORMIN (GLUCOPHAGE) 500 MG tablet Take 1 tablet (500 mg total) by mouth daily with breakfast. 30 tablet 0  . Multiple Vitamins-Minerals (MULTIVITAMIN WOMEN 50+ PO) Take 1 tablet by mouth daily.    Marland Kitchen omega-3 acid ethyl esters (LOVAZA) 1 g capsule Take by mouth daily.    Marland Kitchen OVER THE COUNTER MEDICATION Take 1 capsule by mouth daily. Tumeric/Curcumin 500 mg taking     . polyethylene glycol powder (GLYCOLAX/MIRALAX) powder Take 17 g by mouth daily. Take every 12 hours 3350 g 0  . traZODone (DESYREL) 50 MG tablet Take 50 mg by mouth at bedtime.    . Vitamin D, Ergocalciferol, (DRISDOL) 1.25 MG (50000 UT) CAPS capsule Take 1 capsule (50,000 Units total) by mouth every 7 (seven) days. 4 capsule 0   No current facility-administered medications on file prior to visit.     PAST MEDICAL HISTORY: Past Medical History:  Diagnosis Date  . Anemia   . ANGIODYSPLASIA, INTESTINE, WITHOUT HEMORRHAGE 08/16/2009   Qualifier: Diagnosis of  By: Carlean Purl MD, Dimas Millin Diabetes mellitus without complication ([place])    pt stated prediabetes, not on meds  . Diverticulitis   . GERD (gastroesophageal reflux disease)   . Hx of adenomatous colonic polyps 12/29/2014  . Hypertension   . Obesity   . OSA (obstructive sleep apnea) 01/06/2016   Mild. CPAP discontinued 12/2016 following significant weight loss.   . OSA on CPAP 01/06/2016    PAST SURGICAL HISTORY: Past Surgical History:  Procedure Laterality Date  . ABDOMINAL HYSTERECTOMY    . COLONOSCOPY    . TUBAL LIGATION      SOCIAL HISTORY: Social History   Tobacco Use  . Smoking status: Never Smoker  . Smokeless tobacco: Never Used  Substance Use Topics  . Alcohol use: No     Alcohol/week: 0.0 standard drinks  . Drug use: No    FAMILY HISTORY: Family History  Problem Relation Age of Onset  . Hypertension Father   . Emphysema Maternal Grandmother   . Cancer Maternal Grandfather   . Hypertension Paternal Grandmother   . Cancer Paternal Grandmother   . Diabetes Paternal Grandmother   . Hyperlipidemia Paternal Grandmother   . Hypertension Paternal Grandfather   . Diabetes Paternal Grandfather   . Hyperlipidemia Paternal Grandfather   . Colon cancer Neg Hx     ROS: Review of Systems  Constitutional: Positive for malaise/fatigue and weight loss.  Gastrointestinal: Negative for nausea and vomiting.  Musculoskeletal:       Negative muscle weakness  Endo/Heme/Allergies:       Negative hypoglycemia    PHYSICAL EXAM: Pt in no acute distress  RECENT LABS AND TESTS: BMET    Component Value Date/Time   NA 141 11/26/2017 0838   K 3.8 11/26/2017 0838   CL 106 11/26/2017 0838   CO2 21 11/26/2017 0838   GLUCOSE 117 (  H) 11/26/2017 0838   GLUCOSE 138 (H) 03/10/2016 0919   BUN 21 11/26/2017 0838   CREATININE 1.15 (H) 11/26/2017 0838   CREATININE 0.86 01/31/2015 1129   CALCIUM 9.2 11/26/2017 0838   GFRNONAA 53 (L) 11/26/2017 0838   GFRAA 61 11/26/2017 0838   Lab Results  Component Value Date   HGBA1C 5.5 11/26/2017   HGBA1C 5.7 (H) 08/05/2017   HGBA1C 5.7 (H) 04/15/2017   HGBA1C 5.6 02/07/2017   HGBA1C 5.9 (H) 01/16/2016   Lab Results  Component Value Date   INSULIN 22.8 11/26/2017   INSULIN 19.3 08/05/2017   INSULIN 18.2 04/15/2017   CBC    Component Value Date/Time   WBC 7.2 04/15/2017 0932   WBC 12.7 (H) 03/10/2016 0909   RBC 4.79 04/15/2017 0932   RBC 4.80 03/10/2016 0909   HGB 14.2 04/15/2017 0932   HCT 41.8 04/15/2017 0932   PLT 223 02/07/2017 1157   MCV 87 04/15/2017 0932   MCH 29.6 04/15/2017 0932   MCH 29.2 03/10/2016 0909   MCHC 34.0 04/15/2017 0932   MCHC 34.6 03/10/2016 0909   RDW 13.9 04/15/2017 0932   LYMPHSABS 2.5  04/15/2017 0932   MONOABS 1.0 03/10/2016 0909   EOSABS 0.1 04/15/2017 0932   BASOSABS 0.0 04/15/2017 0932   Iron/TIBC/Ferritin/ %Sat    Component Value Date/Time   FERRITIN 16 06/29/2009   Lipid Panel     Component Value Date/Time   CHOL 162 11/26/2017 0838   TRIG 165 (H) 11/26/2017 0838   HDL 46 11/26/2017 0838   CHOLHDL 3.5 11/26/2017 0838   CHOLHDL 3.5 01/31/2015 1129   VLDL 31 (H) 01/31/2015 1129   LDLCALC 83 11/26/2017 0838   Hepatic Function Panel     Component Value Date/Time   PROT 6.8 11/26/2017 0838   ALBUMIN 4.2 11/26/2017 0838   AST 16 11/26/2017 0838   ALT 32 11/26/2017 0838   ALKPHOS 87 11/26/2017 0838   BILITOT 0.4 11/26/2017 0838   BILIDIR 0.2 03/10/2016 0909   IBILI 0.7 03/10/2016 0909      Component Value Date/Time   TSH 2.430 04/15/2017 0932   TSH 2.710 02/07/2017 1157   TSH 3.310 01/16/2016 1141      I, Trixie Dredge, am acting as transcriptionist for Ilene Qua, MD  I have reviewed the above documentation for accuracy and completeness, and I agree with the above. - Ilene Qua, MD

## 2018-06-29 ENCOUNTER — Encounter (INDEPENDENT_AMBULATORY_CARE_PROVIDER_SITE_OTHER): Payer: Self-pay | Admitting: Family Medicine

## 2018-06-29 ENCOUNTER — Ambulatory Visit (INDEPENDENT_AMBULATORY_CARE_PROVIDER_SITE_OTHER): Payer: BC Managed Care – PPO | Admitting: Family Medicine

## 2018-06-29 ENCOUNTER — Other Ambulatory Visit: Payer: Self-pay

## 2018-06-29 DIAGNOSIS — F3289 Other specified depressive episodes: Secondary | ICD-10-CM | POA: Diagnosis not present

## 2018-06-29 DIAGNOSIS — I10 Essential (primary) hypertension: Secondary | ICD-10-CM

## 2018-06-29 DIAGNOSIS — Z6841 Body Mass Index (BMI) 40.0 and over, adult: Secondary | ICD-10-CM | POA: Diagnosis not present

## 2018-06-29 MED ORDER — BUPROPION HCL ER (XL) 300 MG PO TB24: 300.0000 mg | ORAL_TABLET | Freq: Every day | ORAL | 0 refills | Status: DC

## 2018-06-30 NOTE — Progress Notes (Signed)
Office: [phone]  /  Fax: [phone] TeleHealth Visit:  Catherine Trevino has verbally consented to this TeleHealth visit today. The patient is located at home, the provider is located at the [facility] office. The participants in this visit include the listed provider and patient. The visit was conducted today via webex.  HPI:   Chief Complaint: OBESITY Catherine Trevino is here to discuss her progress with her obesity treatment plan. She is on the Category 3 plan and is following her eating plan approximately 95 % of the time. She states she is walking and doing strengthening for 30 minutes 3 times per week. Catherine Trevino's weight was of 230 lbs yesterday. She is not getting hungry. She is normally breaking up all meals to eat throughout the day to get all food in. She has gotten a Keto ice cream form her daughter.  We were unable to weigh the patient today for this TeleHealth visit. She feels as if she has lost 1 lb since her last visit. She has lost 1-2 lbs since starting treatment with [place].  Hypertension Catherine Trevino is a 58 y.o. female with hypertension. Catherine Trevino's blood pressure was controlled previously. She denies dizziness or lightheadedness. She is working on weight loss to help control her blood pressure with the goal of decreasing her risk of heart attack and stroke.   Depression with Emotional Eating Behaviors Catherine Trevino denies side effects of Wellbutrin. Catherine Trevino struggles with emotional eating and using food for comfort to the extent that it is negatively impacting her health. She often snacks when she is not hungry. Catherine Trevino sometimes feels she is out of control and then feels guilty that she made poor food choices. She has been working on behavior modification techniques to help reduce her emotional eating and has been somewhat successful. She shows no sign of suicidal or homicidal ideations.  Depression screen Catherine Trevino 2/9 04/15/2017 02/07/2017 01/16/2016 10/25/2014   Decreased Interest 1 0 0 0  Down, Depressed, Hopeless 1 0 0 0  PHQ - 2 Score 2 0 0 0  Altered sleeping 1 - - -  Tired, decreased energy 1 - - -  Change in appetite 2 - - -  Feeling bad or failure about yourself  1 - - -  Trouble concentrating 1 - - -  Moving slowly or fidgety/restless 1 - - -  Suicidal thoughts 0 - - -  PHQ-9 Score 9 - - -  Difficult doing work/chores Not difficult at all - - -    ASSESSMENT AND PLAN:  Essential hypertension  Other depression - with emotional eating  - Plan: buPROPion (WELLBUTRIN XL) 300 MG 24 hr tablet  Class 3 severe obesity with serious comorbidity and body mass index (BMI) of 40.0 to 44.9 in adult, unspecified obesity type (HCC)  PLAN:  Hypertension We discussed sodium restriction, working on healthy weight loss, and a regular exercise program as the means to achieve improved blood pressure control. Catherine Trevino agreed with this plan and agreed to follow up as directed. We will continue to monitor her blood pressure as well as her progress with the above lifestyle modifications. Catherine Trevino agrees to continue taking lisinopril-hydrochlorothiazide and will watch for signs of hypotension as she continues her lifestyle modifications. Catherine Trevino agrees to follow up with our clinic in 2 weeks.  Depression with Emotional Eating Behaviors We discussed behavior modification techniques today to help Catherine Trevino deal with her emotional eating and depression. Catherine Trevino agrees to continue taking Wellbutrin XL 300 mg PO daily #30  and we will refill for 1 month. Catherine Trevino agrees to follow up with our clinic in 2 weeks.  Obesity Catherine Trevino is currently in the action stage of change. As such, her goal is to continue with weight loss efforts She has agreed to follow the Category 3 plan Catherine Trevino has been instructed to work up to a goal of 150 minutes of combined cardio and strengthening exercise per week for weight loss and overall health benefits. We discussed the following  Behavioral Modification Strategies today: increasing lean protein intake   Catherine Trevino has agreed to follow up with our clinic in 2 weeks. She was informed of the importance of frequent follow up visits to maximize her success with intensive lifestyle modifications for her multiple health conditions.  ALLERGIES: No Known Allergies  MEDICATIONS: Current Outpatient Medications on File Prior to Visit  Medication Sig Dispense Refill  . lisinopril-hydrochlorothiazide (PRINZIDE,ZESTORETIC) 10-12.5 MG tablet TAKE 1 TABLET DAILY. 90 tablet 3  . metFORMIN (GLUCOPHAGE) 500 MG tablet Take 1 tablet (500 mg total) by mouth daily with breakfast. 30 tablet 0  . Multiple Vitamins-Minerals (MULTIVITAMIN WOMEN 50+ PO) Take 1 tablet by mouth daily.    Marland Kitchen omega-3 acid ethyl esters (LOVAZA) 1 g capsule Take by mouth daily.    Marland Kitchen OVER THE COUNTER MEDICATION Take 1 capsule by mouth daily. Tumeric/Curcumin 500 mg taking     . polyethylene glycol powder (GLYCOLAX/MIRALAX) powder Take 17 g by mouth daily. Take every 12 hours 3350 g 0  . traZODone (DESYREL) 50 MG tablet Take 50 mg by mouth at bedtime.    . Vitamin D, Ergocalciferol, (DRISDOL) 1.25 MG (50000 UT) CAPS capsule Take 1 capsule (50,000 Units total) by mouth every 7 (seven) days. 4 capsule 0   No current facility-administered medications on file prior to visit.     PAST MEDICAL HISTORY: Past Medical History:  Diagnosis Date  . Anemia   . 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 Diverticulitis   . GERD (gastroesophageal reflux disease)   . Hx of adenomatous colonic polyps 12/29/2014  . Hypertension   . Obesity   . OSA (obstructive sleep apnea) 01/06/2016   Mild. CPAP discontinued 12/2016 following significant weight loss.   . OSA on CPAP 01/06/2016    PAST SURGICAL HISTORY: Past Surgical  History:  Procedure Laterality Date  . ABDOMINAL HYSTERECTOMY    . COLONOSCOPY    . TUBAL LIGATION      SOCIAL HISTORY: Social History   Tobacco Use  . Smoking status: Never Smoker  . Smokeless tobacco: Never Used  Substance Use Topics  . Alcohol use: No    Alcohol/week: 0.0 standard drinks  . Drug use: No    FAMILY HISTORY: Family History  Problem Relation Age of Onset  . Hypertension Father   . Emphysema Maternal Grandmother   . Cancer Maternal Grandfather   . Hypertension Paternal Grandmother   . Cancer Paternal Grandmother   . Diabetes Paternal Grandmother   . Hyperlipidemia Paternal Grandmother   . Hypertension Paternal Grandfather   . Diabetes Paternal Grandfather   . Hyperlipidemia Paternal Grandfather   . Colon cancer Neg Hx     ROS: Review of Systems  Constitutional: Positive for weight loss.  Neurological: Negative for dizziness.       Negative lightheadedness  Psychiatric/Behavioral: Positive for depression. Negative for suicidal  ideas.    PHYSICAL EXAM: Pt in no acute distress  RECENT LABS AND TESTS: BMET    Component Value Date/Time   NA 141 11/26/2017 0838   K 3.8 11/26/2017 0838   CL 106 11/26/2017 0838   CO2 21 11/26/2017 0838   GLUCOSE 117 (H) 11/26/2017 0838   GLUCOSE 138 (H) 03/10/2016 0919   BUN 21 11/26/2017 0838   CREATININE 1.15 (H) 11/26/2017 0838   CREATININE 0.86 01/31/2015 1129   CALCIUM 9.2 11/26/2017 0838   GFRNONAA 53 (L) 11/26/2017 0838   GFRAA 61 11/26/2017 0838   Lab Results  Component Value Date   HGBA1C 5.5 11/26/2017   HGBA1C 5.7 (H) 08/05/2017   HGBA1C 5.7 (H) 04/15/2017   HGBA1C 5.6 02/07/2017   HGBA1C 5.9 (H) 01/16/2016   Lab Results  Component Value Date   INSULIN 22.8 11/26/2017   INSULIN 19.3 08/05/2017   INSULIN 18.2 04/15/2017   CBC    Component Value Date/Time   WBC 7.2 04/15/2017 0932   WBC 12.7 (H) 03/10/2016 0909   RBC 4.79 04/15/2017 0932   RBC 4.80 03/10/2016 0909   HGB 14.2  04/15/2017 0932   HCT 41.8 04/15/2017 0932   PLT 223 02/07/2017 1157   MCV 87 04/15/2017 0932   MCH 29.6 04/15/2017 0932   MCH 29.2 03/10/2016 0909   MCHC 34.0 04/15/2017 0932   MCHC 34.6 03/10/2016 0909   RDW 13.9 04/15/2017 0932   LYMPHSABS 2.5 04/15/2017 0932   MONOABS 1.0 03/10/2016 0909   EOSABS 0.1 04/15/2017 0932   BASOSABS 0.0 04/15/2017 0932   Iron/TIBC/Ferritin/ %Sat    Component Value Date/Time   FERRITIN 16 06/29/2009   Lipid Panel     Component Value Date/Time   CHOL 162 11/26/2017 0838   TRIG 165 (H) 11/26/2017 0838   HDL 46 11/26/2017 0838   CHOLHDL 3.5 11/26/2017 0838   CHOLHDL 3.5 01/31/2015 1129   VLDL 31 (H) 01/31/2015 1129   LDLCALC 83 11/26/2017 0838   Hepatic Function Panel     Component Value Date/Time   PROT 6.8 11/26/2017 0838   ALBUMIN 4.2 11/26/2017 0838   AST 16 11/26/2017 0838   ALT 32 11/26/2017 0838   ALKPHOS 87 11/26/2017 0838   BILITOT 0.4 11/26/2017 0838   BILIDIR 0.2 03/10/2016 0909   IBILI 0.7 03/10/2016 0909      Component Value Date/Time   TSH 2.430 04/15/2017 0932   TSH 2.710 02/07/2017 1157   TSH 3.310 01/16/2016 1141      I, Trixie Dredge, am acting as transcriptionist for Ilene Qua, MD  I have reviewed the above documentation for accuracy and completeness, and I agree with the above. - Ilene Qua, MD

## 2018-07-13 ENCOUNTER — Encounter (INDEPENDENT_AMBULATORY_CARE_PROVIDER_SITE_OTHER): Payer: Self-pay | Admitting: Family Medicine

## 2018-07-13 ENCOUNTER — Telehealth (INDEPENDENT_AMBULATORY_CARE_PROVIDER_SITE_OTHER): Payer: BC Managed Care – PPO | Admitting: Family Medicine

## 2018-07-13 ENCOUNTER — Other Ambulatory Visit: Payer: Self-pay

## 2018-07-13 DIAGNOSIS — I10 Essential (primary) hypertension: Secondary | ICD-10-CM | POA: Diagnosis not present

## 2018-07-13 DIAGNOSIS — R7303 Prediabetes: Secondary | ICD-10-CM

## 2018-07-13 DIAGNOSIS — Z6841 Body Mass Index (BMI) 40.0 and over, adult: Secondary | ICD-10-CM | POA: Diagnosis not present

## 2018-07-13 MED ORDER — METFORMIN HCL 500 MG PO TABS: 500.0000 mg | ORAL_TABLET | Freq: Every day | ORAL | 0 refills | Status: DC

## 2018-07-14 NOTE — Progress Notes (Signed)
Office: [phone]  /  Fax: [phone] TeleHealth Visit:  Catherine Trevino has verbally consented to this TeleHealth visit today. The patient is located at home, the provider is located at the [facility] office. The participants in this visit include the listed provider and patient and patient's husband. The visit was conducted today via webex.  HPI:   Chief Complaint: OBESITY Catherine Trevino is here to discuss her progress with her obesity treatment plan. She is on the Category 3 plan and is following her eating plan approximately 95 % of the time. She states she is doing home improvement and renovations for 5-6 hours 7 times per week. Catherine Trevino has been doing some significant home improvements and renovations in the past few weeks. She has been working on getting all the food in, but she is noticing decrease in appetite with increased heat.  We were unable to weigh the patient today for this TeleHealth visit. She feels as if she has 1 lb since her last visit. She has lost 1-2 lbs since starting treatment with [place].  Pre-Diabetes Catherine Trevino has a diagnosis of pre-diabetes based on her elevated Hgb A1c and was informed this puts her at greater risk of developing diabetes. She denies carbohydrate cravings or GI side effects of metformin. She continues to work on diet and exercise to decrease risk of diabetes. She denies hypoglycemia.  Hypertension Catherine Trevino is a 58 y.o. female with hypertension. Catherine Trevino's blood pressure is controlled. She denies chest pain, chest pressure, or headaches. She is working on weight loss to help control her blood pressure with the goal of decreasing her risk of heart attack and stroke.   ASSESSMENT AND PLAN:  Class 3 severe obesity with serious comorbidity and body mass index (BMI) of 40.0 to 44.9 in adult, unspecified obesity type ([place])  Prediabetes - Plan: metFORMIN (GLUCOPHAGE) 500 MG tablet  Essential hypertension  PLAN:  Pre-Diabetes  Catherine Trevino will continue to work on weight loss, exercise, and decreasing simple carbohydrates in her diet to help decrease the risk of diabetes. We dicussed metformin including benefits and risks. She was informed that eating too many simple carbohydrates or too many calories at one sitting increases the likelihood of GI side effects. Shemiah agrees to continue taking metformin 500 mg PO q AM #30 and we will refill for 1 month. Catherine Trevino agrees to follow up with our clinic in 2 weeks as directed to monitor her progress.  Hypertension We discussed sodium restriction, working on healthy weight loss, and a regular exercise program as the means to achieve improved blood pressure control. Catherine Trevino agreed with this plan and agreed to follow up as directed. We will continue to monitor her blood pressure as well as her progress with the above lifestyle modifications. Catherine Trevino agrees to continue her current medications and will watch for signs of hypotension as she continues her lifestyle modifications. We will follow up on her blood pressure at next appointment. Catherine Trevino agrees to follow up with our clinic in 2 weeks.  Obesity Catherine Trevino is currently in the action stage of change. As such, her goal is to continue with weight loss efforts She has agreed to follow the Category 3 plan Catherine Trevino has been instructed to work up to a goal of 150 minutes of combined cardio and strengthening exercise per week for weight loss and overall health benefits. We discussed the following Behavioral Modification Strategies today: increasing lean protein intake, increasing vegetables and work on meal planning and easy cooking plans, keeping healthy  foods in the home, and planning for success   Catherine Trevino has agreed to follow up with our clinic in 2 weeks. She was informed of the importance of frequent follow up visits to maximize her success with intensive lifestyle modifications for her multiple health conditions.  ALLERGIES: No  Known Allergies  MEDICATIONS: Current Outpatient Medications on File Prior to Visit  Medication Sig Dispense Refill  . buPROPion (WELLBUTRIN XL) 300 MG 24 hr tablet Take 1 tablet (300 mg total) by mouth daily. 30 tablet 0  . lisinopril-hydrochlorothiazide (PRINZIDE,ZESTORETIC) 10-12.5 MG tablet TAKE 1 TABLET DAILY. 90 tablet 3  . Multiple Vitamins-Minerals (MULTIVITAMIN WOMEN 50+ PO) Take 1 tablet by mouth daily.    Marland Kitchen omega-3 acid ethyl esters (LOVAZA) 1 g capsule Take by mouth daily.    Marland Kitchen OVER THE COUNTER MEDICATION Take 1 capsule by mouth daily. Tumeric/Curcumin 500 mg taking     . polyethylene glycol powder (GLYCOLAX/MIRALAX) powder Take 17 g by mouth daily. Take every 12 hours 3350 g 0  . traZODone (DESYREL) 50 MG tablet Take 50 mg by mouth at bedtime.    . Vitamin D, Ergocalciferol, (DRISDOL) 1.25 MG (50000 UT) CAPS capsule Take 1 capsule (50,000 Units total) by mouth every 7 (seven) days. 4 capsule 0   No current facility-administered medications on file prior to visit.     PAST MEDICAL HISTORY: Past Medical History:  Diagnosis Date  . Anemia   . 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 Diverticulitis   . GERD (gastroesophageal reflux disease)   . Hx of adenomatous colonic polyps 12/29/2014  . Hypertension   . Obesity   . OSA (obstructive sleep apnea) 01/06/2016   Mild. CPAP discontinued 12/2016 following significant weight loss.   . OSA on CPAP 01/06/2016    PAST SURGICAL HISTORY: Past Surgical History:  Procedure Laterality Date  . ABDOMINAL HYSTERECTOMY    . COLONOSCOPY    . TUBAL LIGATION      SOCIAL HISTORY: Social History   Tobacco Use  . Smoking status: Never Smoker  . Smokeless tobacco: Never Used  Substance Use Topics  . Alcohol use: No    Alcohol/week: 0.0 standard drinks  . Drug use: No     FAMILY HISTORY: Family History  Problem Relation Age of Onset  . Hypertension Father   . Emphysema Maternal Grandmother   . Cancer Maternal Grandfather   . Hypertension Paternal Grandmother   . Cancer Paternal Grandmother   . Diabetes Paternal Grandmother   . Hyperlipidemia Paternal Grandmother   . Hypertension Paternal Grandfather   . Diabetes Paternal Grandfather   . Hyperlipidemia Paternal Grandfather   . Colon cancer Neg Hx     ROS: Review of Systems  Constitutional: Positive for weight loss.  Cardiovascular: Negative for chest pain.       Negative chest pressure  Neurological: Negative for headaches.  Endo/Heme/Allergies:       Negative hypoglycemia    PHYSICAL EXAM: Pt in no acute distress  RECENT LABS AND TESTS: BMET    Component Value Date/Time   NA 141 11/26/2017 0838   K 3.8 11/26/2017 0838   CL 106 11/26/2017 0838   CO2 21 11/26/2017 0838   GLUCOSE 117 (H) 11/26/2017 0838   GLUCOSE 138 (H) 03/10/2016 0919   BUN 21 11/26/2017 0838   CREATININE 1.15 (H)  11/26/2017 0838   CREATININE 0.86 01/31/2015 1129   CALCIUM 9.2 11/26/2017 0838   GFRNONAA 53 (L) 11/26/2017 0838   GFRAA 61 11/26/2017 0838   Lab Results  Component Value Date   HGBA1C 5.5 11/26/2017   HGBA1C 5.7 (H) 08/05/2017   HGBA1C 5.7 (H) 04/15/2017   HGBA1C 5.6 02/07/2017   HGBA1C 5.9 (H) 01/16/2016   Lab Results  Component Value Date   INSULIN 22.8 11/26/2017   INSULIN 19.3 08/05/2017   INSULIN 18.2 04/15/2017   CBC    Component Value Date/Time   WBC 7.2 04/15/2017 0932   WBC 12.7 (H) 03/10/2016 0909   RBC 4.79 04/15/2017 0932   RBC 4.80 03/10/2016 0909   HGB 14.2 04/15/2017 0932   HCT 41.8 04/15/2017 0932   PLT 223 02/07/2017 1157   MCV 87 04/15/2017 0932   MCH 29.6 04/15/2017 0932   MCH 29.2 03/10/2016 0909   MCHC 34.0 04/15/2017 0932   MCHC 34.6 03/10/2016 0909   RDW 13.9 04/15/2017 0932   LYMPHSABS 2.5 04/15/2017 0932   MONOABS 1.0 03/10/2016 0909   EOSABS 0.1  04/15/2017 0932   BASOSABS 0.0 04/15/2017 0932   Iron/TIBC/Ferritin/ %Sat    Component Value Date/Time   FERRITIN 16 06/29/2009   Lipid Panel     Component Value Date/Time   CHOL 162 11/26/2017 0838   TRIG 165 (H) 11/26/2017 0838   HDL 46 11/26/2017 0838   CHOLHDL 3.5 11/26/2017 0838   CHOLHDL 3.5 01/31/2015 1129   VLDL 31 (H) 01/31/2015 1129   LDLCALC 83 11/26/2017 0838   Hepatic Function Panel     Component Value Date/Time   PROT 6.8 11/26/2017 0838   ALBUMIN 4.2 11/26/2017 0838   AST 16 11/26/2017 0838   ALT 32 11/26/2017 0838   ALKPHOS 87 11/26/2017 0838   BILITOT 0.4 11/26/2017 0838   BILIDIR 0.2 03/10/2016 0909   IBILI 0.7 03/10/2016 0909      Component Value Date/Time   TSH 2.430 04/15/2017 0932   TSH 2.710 02/07/2017 1157   TSH 3.310 01/16/2016 1141      I, Trixie Dredge, am acting as transcriptionist for Ilene Qua, MD  I have reviewed the above documentation for accuracy and completeness, and I agree with the above. - Ilene Qua, MD

## 2018-07-24 ENCOUNTER — Encounter (INDEPENDENT_AMBULATORY_CARE_PROVIDER_SITE_OTHER): Payer: Self-pay | Admitting: Family Medicine

## 2018-07-27 ENCOUNTER — Ambulatory Visit (INDEPENDENT_AMBULATORY_CARE_PROVIDER_SITE_OTHER): Payer: BC Managed Care – PPO | Admitting: Family Medicine

## 2018-07-27 ENCOUNTER — Other Ambulatory Visit: Payer: Self-pay

## 2018-07-27 ENCOUNTER — Encounter (INDEPENDENT_AMBULATORY_CARE_PROVIDER_SITE_OTHER): Payer: Self-pay | Admitting: Family Medicine

## 2018-07-27 MED ORDER — VITAMIN D (ERGOCALCIFEROL) 1.25 MG (50000 UNIT) PO CAPS: 50000.0000 [IU] | ORAL_CAPSULE | ORAL | 0 refills | Status: DC

## 2018-08-10 ENCOUNTER — Encounter (INDEPENDENT_AMBULATORY_CARE_PROVIDER_SITE_OTHER): Payer: Self-pay | Admitting: Family Medicine

## 2018-08-10 ENCOUNTER — Other Ambulatory Visit: Payer: Self-pay

## 2018-08-10 ENCOUNTER — Ambulatory Visit (INDEPENDENT_AMBULATORY_CARE_PROVIDER_SITE_OTHER): Payer: BC Managed Care – PPO | Admitting: Family Medicine

## 2018-08-10 DIAGNOSIS — F3289 Other specified depressive episodes: Secondary | ICD-10-CM

## 2018-08-10 DIAGNOSIS — R7303 Prediabetes: Secondary | ICD-10-CM

## 2018-08-10 DIAGNOSIS — E559 Vitamin D deficiency, unspecified: Secondary | ICD-10-CM | POA: Diagnosis not present

## 2018-08-10 DIAGNOSIS — Z6841 Body Mass Index (BMI) 40.0 and over, adult: Secondary | ICD-10-CM

## 2018-08-10 MED ORDER — VITAMIN D (ERGOCALCIFEROL) 1.25 MG (50000 UNIT) PO CAPS: 50000.0000 [IU] | ORAL_CAPSULE | ORAL | 0 refills | Status: DC

## 2018-08-10 MED ORDER — BUPROPION HCL ER (XL) 300 MG PO TB24: 300.0000 mg | ORAL_TABLET | Freq: Every day | ORAL | 0 refills | Status: DC

## 2018-08-10 MED ORDER — METFORMIN HCL 500 MG PO TABS: 500.0000 mg | ORAL_TABLET | Freq: Every day | ORAL | 0 refills | Status: DC

## 2018-08-11 NOTE — Progress Notes (Signed)
Office: [phone]  /  Fax: [phone] TeleHealth Visit:  Catherine Trevino has verbally consented to this TeleHealth visit today. The patient is located at home, the provider is located at the [facility] office. The participants in this visit include the listed provider and patient and any and all parties involved. The visit was conducted today via WebEx.  HPI:   Chief Complaint: OBESITY Catherine Trevino is here to discuss her progress with her obesity treatment plan. She is on the Category 3 plan and is following her eating plan approximately 80 % of the time. She states she is working outside and around the house. Catherine Trevino did have a vacation to the beach during the last few weeks. She is still struggling with the dinner meal, secondary to eating with family who doesn't want to eat what patient is eating. She has a weight of 227 pounds today. We were unable to weigh the patient today for this TeleHealth visit. She feels as if she has lost weight since her last visit. She has lost 0 lbs since starting treatment with [place].  Vitamin D deficiency Catherine Trevino has a diagnosis of vitamin D deficiency. She is currently taking vit D. Catherine Trevino admits fatigue and denies nausea, vomiting or muscle weakness.  Pre-Diabetes Catherine Trevino has a diagnosis of prediabetes based on her elevated Hgb A1c and was informed this puts her at greater risk of developing diabetes. Her last A1c was at 5.7 and last insulin level was at 22.8 She denies any GI side effects of metformin. Catherine Trevino continues to work on diet and exercise to decrease risk of diabetes. She denies nausea or hypoglycemia.  Depression with emotional eating behaviors Catherine Trevino struggles with emotional eating and using food for comfort to the extent that it is negatively impacting her health. She often snacks when she is not hungry. Catherine Trevino sometimes feels she is out of control and then feels guilty that she made poor food choices. Her symptoms are  better controlled on the meal plan. She has been working on behavior modification techniques to help reduce her emotional eating and has been somewhat successful. She shows no sign of suicidal or homicidal ideations.  ASSESSMENT AND PLAN:  Vitamin D deficiency - Plan: Vitamin D, Ergocalciferol, (DRISDOL) 1.25 MG (50000 UT) CAPS capsule  Other depression - with emotional eating  - Plan: buPROPion (WELLBUTRIN XL) 300 MG 24 hr tablet  Prediabetes - Plan: metFORMIN (GLUCOPHAGE) 500 MG tablet  Class 3 severe obesity with serious comorbidity and body mass index (BMI) of 40.0 to 44.9 in adult, unspecified obesity type ([place])  PLAN:  Vitamin D Deficiency Catherine Trevino was informed that low vitamin D levels contributes to fatigue and are associated with obesity, breast, and colon cancer. She agrees to continue to take prescription Vit D @50 ,000 IU every week #4 with no refills and will follow up for routine testing of vitamin D, at least 2-3 times per year. She was informed of the risk of over-replacement of vitamin D and agrees to not increase her dose unless she discusses this with [place] first. Catherine Trevino agrees to follow up with our clinic in 2 weeks.  Pre-Diabetes Catherine Trevino will continue to work on weight loss, exercise, and decreasing simple carbohydrates in her diet to help decrease the risk of diabetes. We dicussed metformin including benefits and risks. She was informed that eating too many simple carbohydrates or too many calories at one sitting increases the likelihood of GI side effects. Catherine Trevino agrees to continue metformin 500 mg daily with  breakfast #30 with no refills and follow up with [place] as directed to monitor her progress.  Depression with Emotional Eating Behaviors We discussed behavior modification techniques today to help Catherine Trevino deal with her emotional eating and depression. She has agreed to continue Bupropion (Wellbutrin XL) 300 mg qAM #30 with no refills and follow up as  directed.  Obesity Catherine Trevino is currently in the action stage of change. As such, her goal is to continue with weight loss efforts She has agreed to follow the Category 3 plan Catherine Trevino has been instructed to work up to a goal of 150 minutes of combined cardio and strengthening exercise per week for weight loss and overall health benefits. We discussed the following Behavioral Modification Strategies today: increasing lean protein intake, increasing vegetables and work on meal planning and easy cooking plans  Catherine Trevino has agreed to follow up with our clinic in 2 weeks. She was informed of the importance of frequent follow up visits to maximize her success with intensive lifestyle modifications for her multiple health conditions.  ALLERGIES: No Known Allergies  MEDICATIONS: Current Outpatient Medications on File Prior to Visit  Medication Sig Dispense Refill   lisinopril-hydrochlorothiazide (PRINZIDE,ZESTORETIC) 10-12.5 MG tablet TAKE 1 TABLET DAILY. 90 tablet 3   Multiple Vitamins-Minerals (MULTIVITAMIN WOMEN 50+ PO) Take 1 tablet by mouth daily.     omega-3 acid ethyl esters (LOVAZA) 1 g capsule Take by mouth daily.     OVER THE COUNTER MEDICATION Take 1 capsule by mouth daily. Tumeric/Curcumin 500 mg taking      polyethylene glycol powder (GLYCOLAX/MIRALAX) powder Take 17 g by mouth daily. Take every 12 hours 3350 g 0   traZODone (DESYREL) 50 MG tablet Take 50 mg by mouth at bedtime.     No current facility-administered medications on file prior to visit.     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 CPAP discontinued 12/2016 following significant weight loss.    OSA on CPAP 01/06/2016    PAST SURGICAL HISTORY: Past Surgical History:  Procedure Laterality Date   ABDOMINAL HYSTERECTOMY     COLONOSCOPY     TUBAL LIGATION      SOCIAL HISTORY: Social History   Tobacco Use   Smoking status: Never Smoker   Smokeless tobacco: Never Used  Substance Use Topics   Alcohol use: No    Alcohol/week: 0.0 standard drinks   Drug use: No    FAMILY HISTORY: Family History  Problem Relation Age of Onset   Hypertension Father    Emphysema Maternal Grandmother    Cancer Maternal Grandfather    Hypertension Paternal Grandmother    Cancer Paternal Grandmother    Diabetes Paternal Grandmother    Hyperlipidemia Paternal Grandmother    Hypertension Paternal Grandfather    Diabetes Paternal Grandfather    Hyperlipidemia Paternal Grandfather    Colon cancer Neg Hx     ROS: Review of Systems  Constitutional: Positive for malaise/fatigue and weight loss.  Gastrointestinal: Negative for diarrhea, nausea and vomiting.  Musculoskeletal:       Negative  for muscle weakness  Psychiatric/Behavioral: Positive for depression. Negative for suicidal ideas.    PHYSICAL EXAM: Pt in no acute distress  RECENT LABS AND TESTS: BMET    Component Value Date/Time   NA 141 11/26/2017 0838   K 3.8 11/26/2017 0838   CL 106 11/26/2017 0838   CO2 21 11/26/2017 0838   GLUCOSE 117 (H) 11/26/2017 0838   GLUCOSE 138 (H) 03/10/2016 0919   BUN 21 11/26/2017 0838   CREATININE 1.15 (H) 11/26/2017 0838   CREATININE 0.86 01/31/2015 1129   CALCIUM 9.2 11/26/2017 0838   GFRNONAA 53 (L) 11/26/2017 0838   GFRAA 61 11/26/2017 0838   Lab Results  Component Value Date   HGBA1C 5.5 11/26/2017   HGBA1C 5.7 (H) 08/05/2017   HGBA1C 5.7 (H) 04/15/2017   HGBA1C 5.6 02/07/2017   HGBA1C 5.9 (H) 01/16/2016   Lab Results  Component Value Date   INSULIN  22.8 11/26/2017   INSULIN 19.3 08/05/2017   INSULIN 18.2 04/15/2017   CBC    Component Value Date/Time   WBC 7.2 04/15/2017 0932   WBC 12.7 (H) 03/10/2016 0909   RBC 4.79 04/15/2017 0932   RBC 4.80 03/10/2016 0909   HGB 14.2 04/15/2017 0932   HCT 41.8 04/15/2017 0932   PLT 223 02/07/2017 1157   MCV 87 04/15/2017 0932   MCH 29.6 04/15/2017 0932   MCH 29.2 03/10/2016 0909   MCHC 34.0 04/15/2017 0932   MCHC 34.6 03/10/2016 0909   RDW 13.9 04/15/2017 0932   LYMPHSABS 2.5 04/15/2017 0932   MONOABS 1.0 03/10/2016 0909   EOSABS 0.1 04/15/2017 0932   BASOSABS 0.0 04/15/2017 0932   Iron/TIBC/Ferritin/ %Sat    Component Value Date/Time   FERRITIN 16 06/29/2009   Lipid Panel     Component Value Date/Time   CHOL 162 11/26/2017 0838   TRIG 165 (H) 11/26/2017 0838   HDL 46 11/26/2017 0838   CHOLHDL 3.5 11/26/2017 0838   CHOLHDL 3.5 01/31/2015 1129   VLDL 31 (H) 01/31/2015 1129   LDLCALC 83 11/26/2017 0838   Hepatic Function Panel     Component Value Date/Time   PROT 6.8 11/26/2017 0838   ALBUMIN 4.2 11/26/2017 0838   AST 16 11/26/2017 0838   ALT 32 11/26/2017 0838   ALKPHOS 87 11/26/2017 0838   BILITOT 0.4 11/26/2017 0838   BILIDIR 0.2 03/10/2016 0909   IBILI 0.7 03/10/2016 0909      Component Value Date/Time   TSH 2.430 04/15/2017 0932   TSH 2.710 02/07/2017 1157   TSH 3.310 01/16/2016 1141     Ref. Range 11/26/2017 08:38  Vitamin D, 25-Hydroxy Latest Ref Range: 30.0 - 100.0 ng/mL 37.1    I, Doreene Nest, am acting as Location manager for Eber Jones, MD  I have reviewed the above documentation for accuracy and completeness, and I agree with the above. - Ilene Qua, MD

## 2018-12-20 ENCOUNTER — Other Ambulatory Visit: Payer: Self-pay | Admitting: Physician Assistant

## 2018-12-20 DIAGNOSIS — I10 Essential (primary) hypertension: Secondary | ICD-10-CM

## 2018-12-21 NOTE — Telephone Encounter (Signed)
Requested medication (s) are due for refill today: yes  Requested medication (s) are on the active medication list: yes  Last refill: 08/03/2018  Future visit scheduled: no  Notes to clinic:  Overdue for office visit    Requested Prescriptions  Pending Prescriptions Disp Refills   lisinopril-hydrochlorothiazide (ZESTORETIC) 10-12.5 MG tablet [Pharmacy Med Name: LISINOP/HCTZ TAB 10-12.5] 90 tablet 3    Sig: TAKE 1 TABLET DAILY.     Cardiovascular:  ACEI + Diuretic Combos Failed - 12/20/2018  8:42 AM      Failed - Na in normal range and within 180 days    Sodium  Date Value Ref Range Status  11/26/2017 141 134 - 144 mmol/L Final         Failed - K in normal range and within 180 days    Potassium  Date Value Ref Range Status  11/26/2017 3.8 3.5 - 5.2 mmol/L Final         Failed - Cr in normal range and within 180 days    Creat  Date Value Ref Range Status  01/31/2015 0.86 0.50 - 1.05 mg/dL Final   Creatinine, Ser  Date Value Ref Range Status  11/26/2017 1.15 (H) 0.57 - 1.00 mg/dL Final         Failed - Ca in normal range and within 180 days    Calcium  Date Value Ref Range Status  11/26/2017 9.2 8.7 - 10.2 mg/dL Final         Failed - Valid encounter within last 6 months    Recent Outpatient Visits          1 year ago Essential hypertension   Primary [facility] at [facility], Gelene Mink, PA-C   1 year ago Annual physical exam   Primary Care at [place], [place], [place]   2 years ago Annual physical exam   Primary Care at [place], [place], [place]   3 years ago Prediabetes   Primary Care at [place], [place], [place]   4 years ago Annual physical exam   Primary Care at [place], [place], [place] - Patient is not pregnant      Passed - Last BP in normal range    BP Readings from Last 1 Encounters:  03/18/18 133/83

## 2019-12-16 ENCOUNTER — Other Ambulatory Visit: Payer: Self-pay | Admitting: Physician Assistant

## 2019-12-16 DIAGNOSIS — Z1231 Encounter for screening mammogram for malignant neoplasm of breast: Secondary | ICD-10-CM

## 2019-12-20 DIAGNOSIS — Z1231 Encounter for screening mammogram for malignant neoplasm of breast: Secondary | ICD-10-CM

## 2020-01-28 ENCOUNTER — Ambulatory Visit
Admission: RE | Admit: 2020-01-28 | Discharge: 2020-01-28 | Disposition: A | Discharge status: Home / Self Care | Payer: BC Managed Care – PPO | Source: Ambulatory Visit | Attending: Physician Assistant | Admitting: Physician Assistant

## 2020-01-28 ENCOUNTER — Other Ambulatory Visit: Payer: Self-pay

## 2020-01-28 DIAGNOSIS — Z1231 Encounter for screening mammogram for malignant neoplasm of breast: Secondary | ICD-10-CM

## 2020-04-26 ENCOUNTER — Encounter: Payer: Self-pay | Admitting: Internal Medicine

## 2020-05-02 ENCOUNTER — Encounter: Payer: Self-pay | Admitting: Internal Medicine

## 2020-07-06 ENCOUNTER — Other Ambulatory Visit: Payer: Self-pay

## 2020-07-06 ENCOUNTER — Ambulatory Visit (AMBULATORY_SURGERY_CENTER): Payer: BC Managed Care – PPO | Admitting: *Deleted

## 2020-07-06 VITALS — Ht 64.0 in | Wt 244.0 lb

## 2020-07-06 DIAGNOSIS — Z86010 Personal history of colonic polyps: Secondary | ICD-10-CM

## 2020-07-06 NOTE — Progress Notes (Signed)

## 2020-07-19 ENCOUNTER — Encounter: Payer: Self-pay | Admitting: Internal Medicine

## 2020-07-20 ENCOUNTER — Ambulatory Visit (AMBULATORY_SURGERY_CENTER): Payer: BC Managed Care – PPO | Admitting: Internal Medicine

## 2020-07-20 ENCOUNTER — Encounter: Payer: Self-pay | Admitting: Internal Medicine

## 2020-07-20 ENCOUNTER — Other Ambulatory Visit: Payer: Self-pay

## 2020-07-20 VITALS — BP 112/64 | HR 76 | Temp 97.3°F | Resp 14 | Ht 64.0 in | Wt 244.0 lb

## 2020-07-20 DIAGNOSIS — Z86010 Personal history of colonic polyps: Secondary | ICD-10-CM

## 2020-07-20 DIAGNOSIS — D128 Benign neoplasm of rectum: Secondary | ICD-10-CM

## 2020-07-20 HISTORY — PX: COLONOSCOPY: SHX174

## 2020-07-20 MED ORDER — SODIUM CHLORIDE 0.9 % IV SOLN: 500.0000 mL | Freq: Once | INTRAVENOUS | Status: DC

## 2020-07-20 NOTE — Progress Notes (Signed)
Called to room to assist during endoscopic procedure.  Patient ID and intended procedure confirmed with present staff. Received instructions for my participation in the procedure from the performing physician.  

## 2020-07-20 NOTE — Op Note (Signed)
[facility] Patient Name: Catherine Trevino Procedure Date: 07/20/2020 11:25 AM MRN: [id number] Endoscopist: Gatha Mayer , MD Age: 60 Referring MD:  Date of Birth: [birth year] Gender: Female Account #: [id number] Procedure:                Colonoscopy Indications:              Surveillance: Personal history of adenomatous                            polyps on last colonoscopy > 5 years ago, Last                            colonoscopy: 2016 Medicines:                Propofol per Anesthesia, Monitored Anesthesia Care Procedure:                Pre-Anesthesia Assessment:                           - Prior to the procedure, a History and Physical                            was performed, and patient medications and                            allergies were reviewed. The patient's tolerance of                            previous anesthesia was also reviewed. The risks                            and benefits of the procedure and the sedation                            options and risks were discussed with the patient.                            All questions were answered, and informed consent                            was obtained. Prior Anticoagulants: The patient has                            taken no previous anticoagulant or antiplatelet                            agents. ASA Grade Assessment: III - A patient with                            severe systemic disease. After reviewing the risks                            and benefits, the patient was deemed in  satisfactory condition to undergo the procedure.                           After obtaining informed consent, the colonoscope                            was passed under direct vision. Throughout the                            procedure, the patient's blood pressure, pulse, and                            oxygen saturations were monitored continuously. The                            Olympus CF-HQ190L  [id number]) Colonoscope was                            introduced through the anus and advanced to the the                            cecum, identified by appendiceal orifice and                            ileocecal valve. The colonoscopy was performed                            without difficulty. The patient tolerated the                            procedure well. The quality of the bowel                            preparation was good. The ileocecal valve,                            appendiceal orifice, and rectum were photographed.                            The bowel preparation used was Miralax via split                            dose instruction. Scope In: 11:29:55 AM Scope Out: 11:40:17 AM Scope Withdrawal Time: 0 hours 8 minutes 46 seconds  Total Procedure Duration: 0 hours 10 minutes 22 seconds  Findings:                 The perianal and digital rectal examinations were                            normal.                           A diminutive polyp was found in the rectum. The  polyp was sessile. The polyp was removed with a                            cold snare. Resection and retrieval were complete.                            Verification of patient identification for the                            specimen was done. Estimated blood loss was minimal.                           The exam was otherwise without abnormality on                            direct and retroflexion views. Complications:            No immediate complications. Estimated Blood Loss:     Estimated blood loss was minimal. Impression:               - One diminutive polyp in the rectum, removed with                            a cold snare. Resected and retrieved. Not                            photographed                           - The examination was otherwise normal on direct                            and retroflexion views.                           - Personal history of  colonic polyps. 2 diminutive                            adenoms 2016 Recommendation:           - Patient has a contact number available for                            emergencies. The signs and symptoms of potential                            delayed complications were discussed with the                            patient. Return to normal activities tomorrow.                            Written discharge instructions were provided to the                            patient.                           -  Resume previous diet.                           - Continue present medications.                           - Repeat colonoscopy is recommended. The                            colonoscopy date will be determined after pathology                            results from today's exam become available for                            review. Gatha Mayer, MD 07/20/2020 11:44:36 AM This report has been signed electronically.

## 2020-07-20 NOTE — Patient Instructions (Addendum)
I found and removed one tiny polyp. I will let you know pathology results and when to have another routine colonoscopy by mail and/or My Chart.  I appreciate the opportunity to care for you. Gatha Mayer, MD, [facility]  Handout given for polyps.  YOU HAD AN ENDOSCOPIC PROCEDURE TODAY AT [facility]:   Refer to the procedure report that was given to you for any specific questions about what was found during the examination.  If the procedure report does not answer your questions, please call your gastroenterologist to clarify.  If you requested that your care partner not be given the details of your procedure findings, then the procedure report has been included in a sealed envelope for you to review at your convenience later.  YOU SHOULD EXPECT: Some feelings of bloating in the abdomen. Passage of more gas than usual.  Walking can help get rid of the air that was put into your GI tract during the procedure and reduce the bloating. If you had a lower endoscopy (such as a colonoscopy or flexible sigmoidoscopy) you may notice spotting of blood in your stool or on the toilet paper. If you underwent a bowel prep for your procedure, you may not have a normal bowel movement for a few days.  Please Note:  You might notice some irritation and congestion in your nose or some drainage.  This is from the oxygen used during your procedure.  There is no need for concern and it should clear up in a day or so.  SYMPTOMS TO REPORT IMMEDIATELY:  Following lower endoscopy (colonoscopy or flexible sigmoidoscopy):  Excessive amounts of blood in the stool  Significant tenderness or worsening of abdominal pains  Swelling of the abdomen that is new, acute  Fever of 100F or higher  For urgent or emergent issues, a gastroenterologist can be reached at any hour by calling [phone]. Do not use MyChart messaging for urgent concerns.    DIET:  We do recommend a small meal at first, but then you may  proceed to your regular diet.  Drink plenty of fluids but you should avoid alcoholic beverages for 24 hours.  ACTIVITY:  You should plan to take it easy for the rest of today and you should NOT DRIVE or use heavy machinery until tomorrow (because of the sedation medicines used during the test).    FOLLOW UP: Our staff will call the number listed on your records 48-72 hours following your procedure to check on you and address any questions or concerns that you may have regarding the information given to you following your procedure. If we do not reach you, we will leave a message.  We will attempt to reach you two times.  During this call, we will ask if you have developed any symptoms of COVID 19. If you develop any symptoms (ie: fever, flu-like symptoms, shortness of breath, cough etc.) before then, please call [phone].  If you test positive for Covid 19 in the 2 weeks post procedure, please call and report this information to [place].    If any biopsies were taken you will be contacted by phone or by letter within the next 1-3 weeks.  Please call us at [phone] if you have not heard about the biopsies in 3 weeks.    SIGNATURES/CONFIDENTIALITY: You and/or your care partner have signed paperwork which will be entered into your electronic medical record.  These signatures attest to the fact that that the information above on  your After Visit Summary has been reviewed and is understood.  Full responsibility of the confidentiality of this discharge information lies with you and/or your care-partner.

## 2020-07-20 NOTE — Progress Notes (Signed)
Pt's states no medical or surgical changes since previsit or office visit.  ° °Vitals CW °

## 2020-07-20 NOTE — Progress Notes (Signed)
A/ox3, pleased with MAC, report to RN 

## 2020-07-24 ENCOUNTER — Telehealth: Payer: Self-pay

## 2020-07-24 NOTE — Telephone Encounter (Signed)
LVM

## 2020-07-24 NOTE — Telephone Encounter (Signed)
Left message on follow up call. 

## 2020-08-07 ENCOUNTER — Encounter: Payer: Self-pay | Admitting: Internal Medicine

## 2020-09-19 IMAGING — MG DIGITAL SCREENING BILATERAL MAMMOGRAM WITH TOMO AND CAD
8 series · 8 of 24 positions shown · non-contrast
Comparison: Previous exam(s).

CLINICAL DATA: Screening.

EXAM:
DIGITAL SCREENING BILATERAL MAMMOGRAM WITH TOMO AND CAD

[R MLO synth-2D]
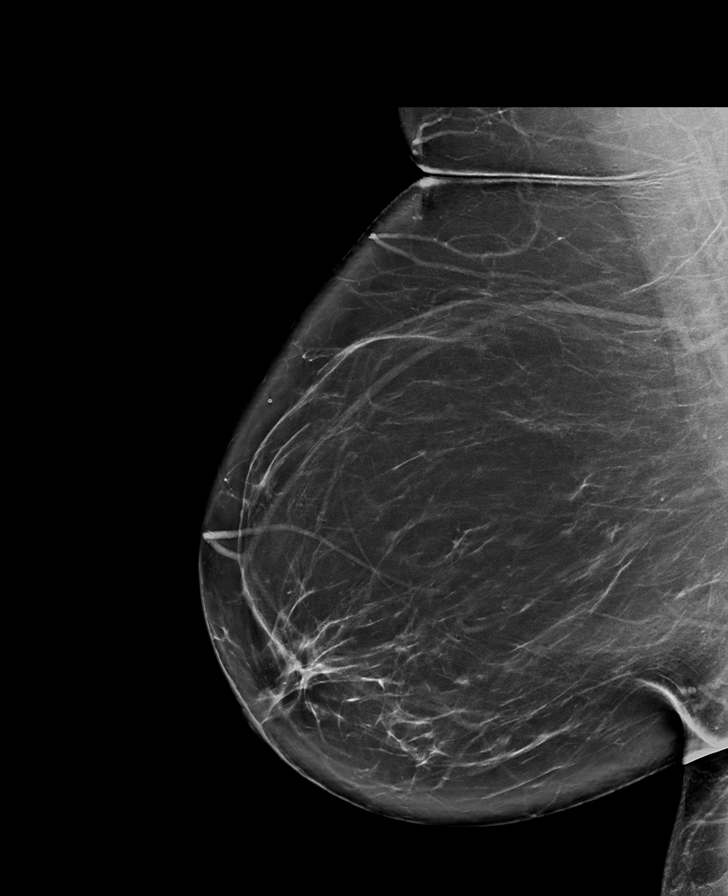

[L CC synth-2D]
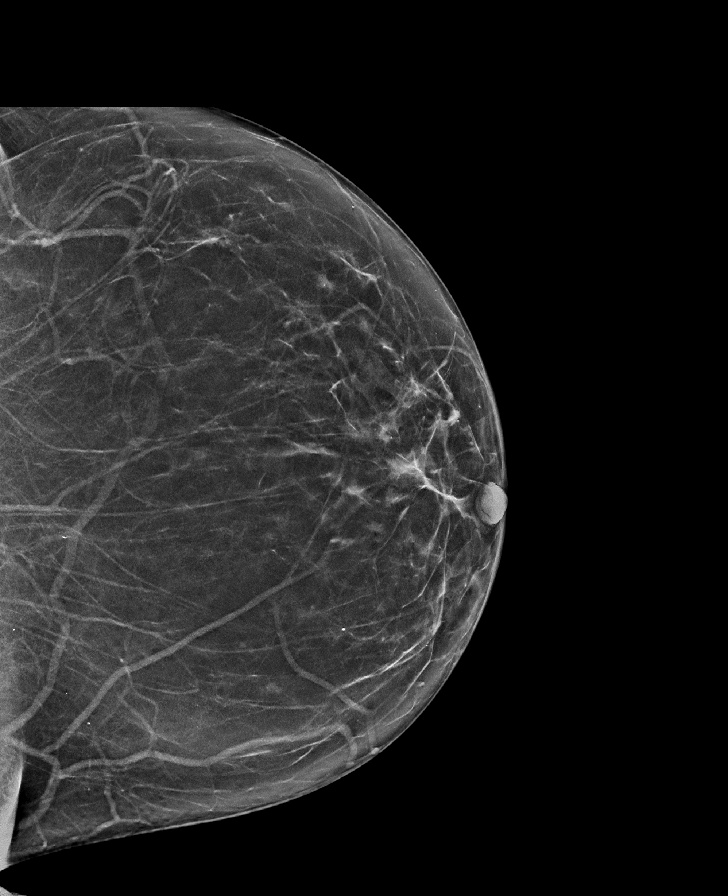

[L MLO synth-2D]
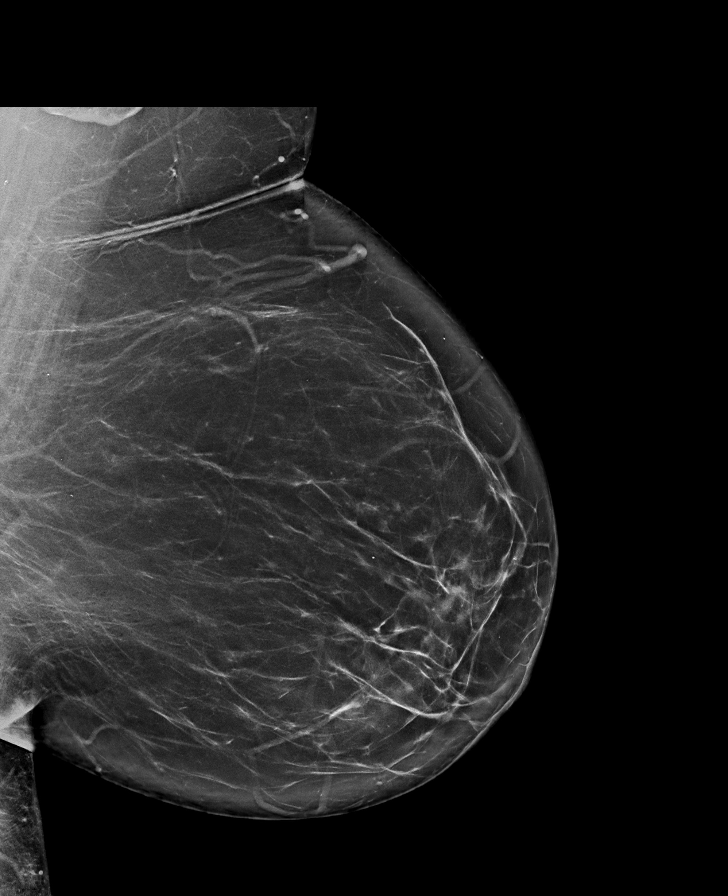

[R CC synth-2D]
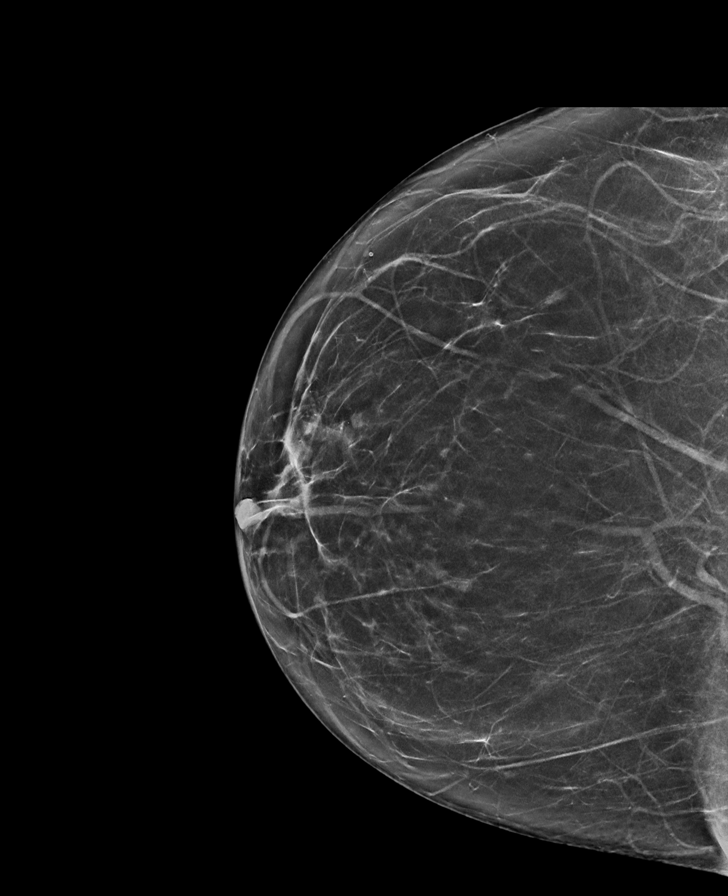

[R MLO tomo · tomo slice 49/98.0]
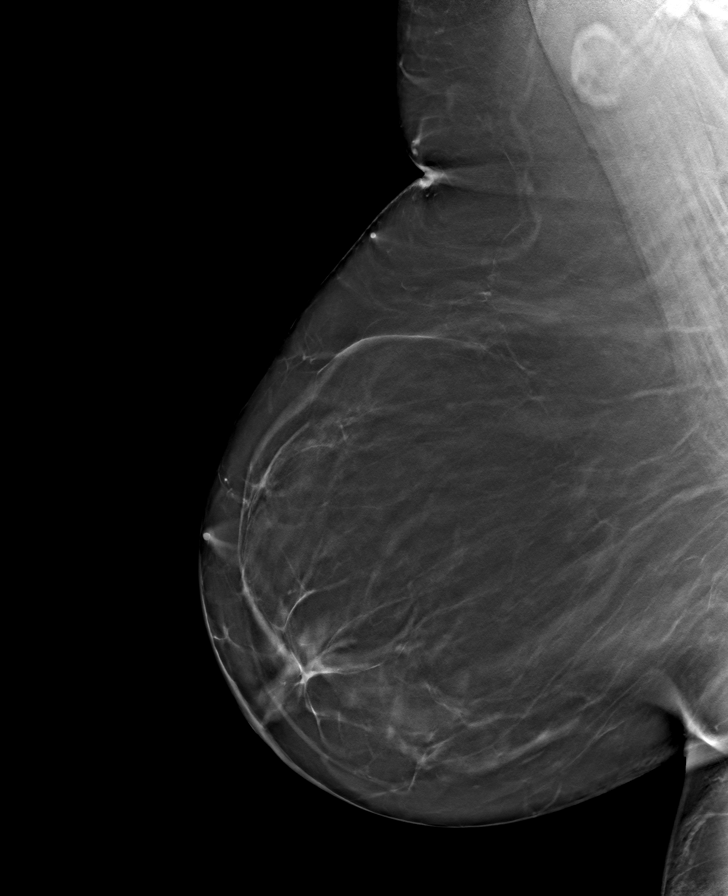

[R CC tomo · tomo slice 39/76.0]
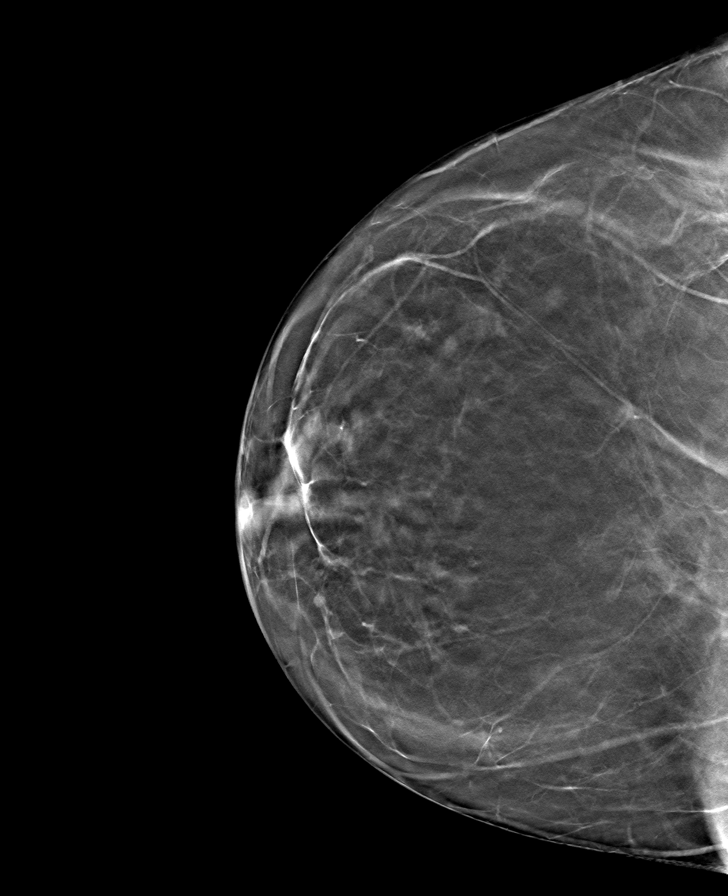

[L MLO tomo · tomo slice 49/96.0]
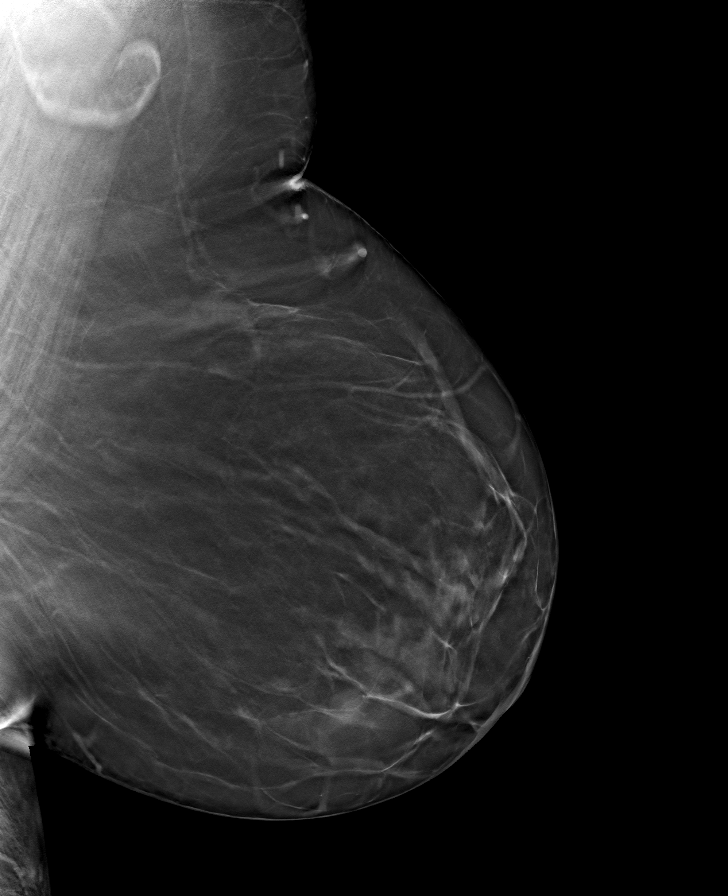

[L CC tomo · tomo slice 37/74.0]
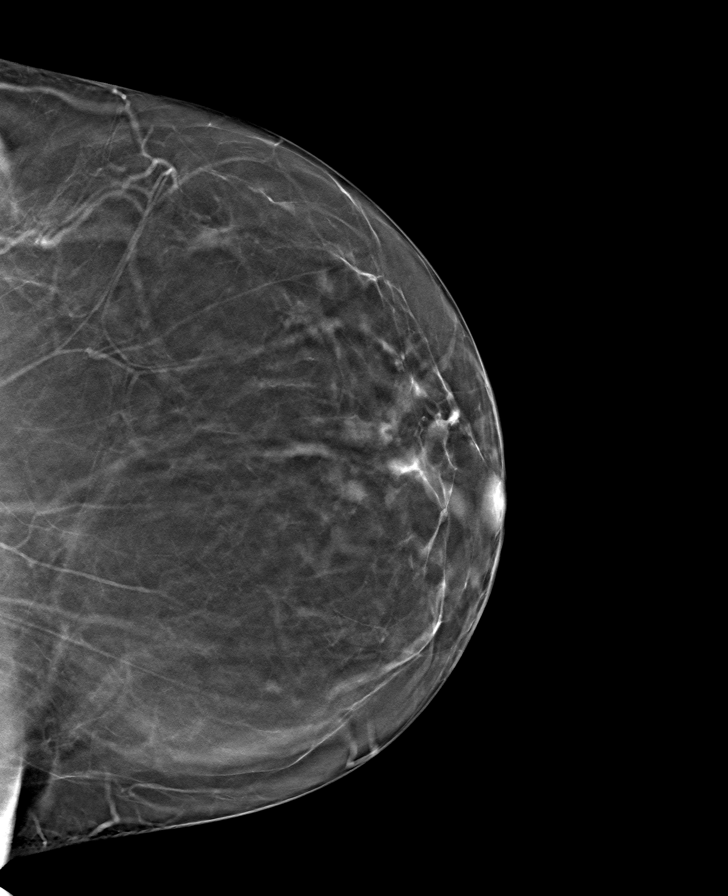

[8 of 24 positions shown; findings below may reference images not displayed]

ACR Breast Density Category b: There are scattered areas of
fibroglandular density.
FINDINGS: There are no findings suspicious for malignancy. Images were
processed with CAD.
IMPRESSION: No mammographic evidence of malignancy. A result letter of this
screening mammogram will be mailed directly to the patient.

RECOMMENDATION:
Screening mammogram in one year. (Code:[id number])

BI-RADS CATEGORY  1: Negative.

## 2021-11-27 ENCOUNTER — Emergency Department (HOSPITAL_BASED_OUTPATIENT_CLINIC_OR_DEPARTMENT_OTHER): Payer: BC Managed Care – PPO

## 2021-11-27 ENCOUNTER — Encounter (HOSPITAL_BASED_OUTPATIENT_CLINIC_OR_DEPARTMENT_OTHER): Payer: Self-pay | Admitting: Emergency Medicine

## 2021-11-27 ENCOUNTER — Inpatient Hospital Stay (HOSPITAL_BASED_OUTPATIENT_CLINIC_OR_DEPARTMENT_OTHER)
Admission: EM | Admit: 2021-11-27 | Discharge: 2021-11-29 | DRG: 121 | Disposition: A | Discharge status: Other Acute Inpatient Hospital | Payer: BC Managed Care – PPO | Attending: Internal Medicine | Admitting: Internal Medicine

## 2021-11-27 ENCOUNTER — Encounter (HOSPITAL_COMMUNITY): Payer: Self-pay

## 2021-11-27 ENCOUNTER — Other Ambulatory Visit: Payer: Self-pay

## 2021-11-27 DIAGNOSIS — E785 Hyperlipidemia, unspecified: Secondary | ICD-10-CM | POA: Diagnosis not present

## 2021-11-27 DIAGNOSIS — I10 Essential (primary) hypertension: Secondary | ICD-10-CM | POA: Diagnosis present

## 2021-11-27 DIAGNOSIS — F909 Attention-deficit hyperactivity disorder, unspecified type: Secondary | ICD-10-CM | POA: Diagnosis present

## 2021-11-27 DIAGNOSIS — E1169 Type 2 diabetes mellitus with other specified complication: Secondary | ICD-10-CM | POA: Diagnosis present

## 2021-11-27 DIAGNOSIS — K219 Gastro-esophageal reflux disease without esophagitis: Secondary | ICD-10-CM | POA: Diagnosis present

## 2021-11-27 DIAGNOSIS — Z79899 Other long term (current) drug therapy: Secondary | ICD-10-CM | POA: Diagnosis not present

## 2021-11-27 DIAGNOSIS — E669 Obesity, unspecified: Secondary | ICD-10-CM | POA: Diagnosis present

## 2021-11-27 DIAGNOSIS — Z91048 Other nonmedicinal substance allergy status: Secondary | ICD-10-CM | POA: Diagnosis not present

## 2021-11-27 DIAGNOSIS — H538 Other visual disturbances: Secondary | ICD-10-CM | POA: Diagnosis present

## 2021-11-27 DIAGNOSIS — E559 Vitamin D deficiency, unspecified: Secondary | ICD-10-CM | POA: Diagnosis present

## 2021-11-27 DIAGNOSIS — Z833 Family history of diabetes mellitus: Secondary | ICD-10-CM

## 2021-11-27 DIAGNOSIS — G4733 Obstructive sleep apnea (adult) (pediatric): Secondary | ICD-10-CM

## 2021-11-27 DIAGNOSIS — E119 Type 2 diabetes mellitus without complications: Secondary | ICD-10-CM | POA: Diagnosis present

## 2021-11-27 DIAGNOSIS — H05012 Cellulitis of left orbit: Principal | ICD-10-CM | POA: Diagnosis present

## 2021-11-27 DIAGNOSIS — H11422 Conjunctival edema, left eye: Secondary | ICD-10-CM | POA: Diagnosis present

## 2021-11-27 DIAGNOSIS — Z6839 Body mass index (BMI) 39.0-39.9, adult: Secondary | ICD-10-CM

## 2021-11-27 DIAGNOSIS — L03213 Periorbital cellulitis: Secondary | ICD-10-CM | POA: Diagnosis present

## 2021-11-27 DIAGNOSIS — Z6837 Body mass index (BMI) 37.0-37.9, adult: Secondary | ICD-10-CM | POA: Diagnosis not present

## 2021-11-27 DIAGNOSIS — Z86010 Personal history of colonic polyps: Secondary | ICD-10-CM

## 2021-11-27 DIAGNOSIS — Z8249 Family history of ischemic heart disease and other diseases of the circulatory system: Secondary | ICD-10-CM

## 2021-11-27 DIAGNOSIS — Z7984 Long term (current) use of oral hypoglycemic drugs: Secondary | ICD-10-CM | POA: Diagnosis not present

## 2021-11-27 DIAGNOSIS — Z90710 Acquired absence of both cervix and uterus: Secondary | ICD-10-CM

## 2021-11-27 LAB — CBC WITH DIFFERENTIAL/PLATELET
Abs Immature Granulocytes: 0.03 10*3/uL (ref 0.00–0.07)
Basophils Absolute: 0 10*3/uL (ref 0.0–0.1)
Basophils Relative: 0 %
Eosinophils Absolute: 0.1 10*3/uL (ref 0.0–0.5)
Eosinophils Relative: 1 %
HCT: 39.7 % (ref 36.0–46.0)
Hemoglobin: 13.8 g/dL (ref 12.0–15.0)
Immature Granulocytes: 0 %
Lymphocytes Relative: 22 %
Lymphs Abs: 2.3 10*3/uL (ref 0.7–4.0)
MCH: 28.6 pg (ref 26.0–34.0)
MCHC: 34.8 g/dL (ref 30.0–36.0)
MCV: 82.4 fL (ref 80.0–100.0)
Monocytes Absolute: 0.5 10*3/uL (ref 0.1–1.0)
Monocytes Relative: 5 %
Neutro Abs: 7.4 10*3/uL (ref 1.7–7.7)
Neutrophils Relative %: 72 %
Platelets: 220 10*3/uL (ref 150–400)
RBC: 4.82 MIL/uL (ref 3.87–5.11)
RDW: 12.9 % (ref 11.5–15.5)
WBC: 10.4 10*3/uL (ref 4.0–10.5)
nRBC: 0 % (ref 0.0–0.2)

## 2021-11-27 LAB — COMPREHENSIVE METABOLIC PANEL
ALT: 25 U/L (ref 0–44)
AST: 20 U/L (ref 15–41)
Albumin: 3.7 g/dL (ref 3.5–5.0)
Alkaline Phosphatase: 81 U/L (ref 38–126)
Anion gap: 9 (ref 5–15)
BUN: 15 mg/dL (ref 8–23)
CO2: 25 mmol/L (ref 22–32)
Calcium: 9.1 mg/dL (ref 8.9–10.3)
Chloride: 104 mmol/L (ref 98–111)
Creatinine, Ser: 0.87 mg/dL (ref 0.44–1.00)
GFR, Estimated: >60 mL/min (ref >60)
Glucose, Bld: 136 mg/dL — ABNORMAL HIGH (ref 70–99)
Potassium: 4.1 mmol/L (ref 3.5–5.1)
Sodium: 138 mmol/L (ref 135–145)
Total Bilirubin: 0.4 mg/dL (ref 0.3–1.2)
Total Protein: 7.3 g/dL (ref 6.5–8.1)

## 2021-11-27 LAB — GLUCOSE, CAPILLARY: Glucose-Capillary: 110 mg/dL — ABNORMAL HIGH (ref 70–99)

## 2021-11-27 LAB — SEDIMENTATION RATE: Sed Rate: 17 mm/hr (ref 0–22)

## 2021-11-27 MED ORDER — KETOROLAC TROMETHAMINE 15 MG/ML IJ SOLN
15.0000 mg | Freq: Once | INTRAMUSCULAR | Status: AC
  Administered 2021-11-27: 15 mg via INTRAVENOUS
  Filled 2021-11-27: qty 1

## 2021-11-27 MED ORDER — IOHEXOL 300 MG/ML  SOLN
75.0000 mL | Freq: Once | INTRAMUSCULAR | Status: AC | PRN
  Administered 2021-11-27: 75 mL via INTRAVENOUS

## 2021-11-27 MED ORDER — VANCOMYCIN HCL 1250 MG/250ML IV SOLN
1250.0000 mg | INTRAVENOUS | Status: DC
  Administered 2021-11-28 – 2021-11-29 (×2): 1250 mg via INTRAVENOUS
  Filled 2021-11-27 (×3): qty 250

## 2021-11-27 MED ORDER — SODIUM CHLORIDE 0.9 % IV SOLN
2.0000 g | INTRAVENOUS | Status: DC
  Administered 2021-11-28 – 2021-11-29 (×2): 2 g via INTRAVENOUS
  Filled 2021-11-27 (×2): qty 20

## 2021-11-27 MED ORDER — LACTATED RINGERS IV SOLN: INTRAVENOUS | Status: DC

## 2021-11-27 MED ORDER — HYDRALAZINE HCL 20 MG/ML IJ SOLN: 5.0000 mg | INTRAMUSCULAR | Status: DC | PRN

## 2021-11-27 MED ORDER — ACETAMINOPHEN 325 MG PO TABS
650.0000 mg | ORAL_TABLET | Freq: Four times a day (QID) | ORAL | Status: DC | PRN
  Administered 2021-11-27 – 2021-11-29 (×5): 650 mg via ORAL
  Filled 2021-11-27 (×5): qty 2

## 2021-11-27 MED ORDER — ENOXAPARIN SODIUM 40 MG/0.4ML IJ SOSY
40.0000 mg | PREFILLED_SYRINGE | Freq: Every day | INTRAMUSCULAR | Status: DC
  Administered 2021-11-28 – 2021-11-29 (×2): 40 mg via SUBCUTANEOUS
  Filled 2021-11-27 (×2): qty 0.4

## 2021-11-27 MED ORDER — ACETAMINOPHEN 650 MG RE SUPP: 650.0000 mg | Freq: Four times a day (QID) | RECTAL | Status: DC | PRN

## 2021-11-27 MED ORDER — TETRACAINE HCL 0.5 % OP SOLN
1.0000 [drp] | Freq: Once | OPHTHALMIC | Status: AC
  Administered 2021-11-27: 1 [drp] via OPHTHALMIC
  Filled 2021-11-27: qty 4

## 2021-11-27 MED ORDER — DEXTROSE 5 % IV SOLN
750.0000 mg | Freq: Three times a day (TID) | INTRAVENOUS | Status: DC
  Administered 2021-11-27 – 2021-11-29 (×6): 750 mg via INTRAVENOUS
  Filled 2021-11-27 (×10): qty 15

## 2021-11-27 MED ORDER — ONDANSETRON HCL 4 MG PO TABS
4.0000 mg | ORAL_TABLET | Freq: Four times a day (QID) | ORAL | Status: DC | PRN
  Administered 2021-11-27: 4 mg via ORAL
  Filled 2021-11-27 (×2): qty 1

## 2021-11-27 MED ORDER — OXYCODONE HCL 5 MG PO TABS
5.0000 mg | ORAL_TABLET | ORAL | Status: DC | PRN
  Administered 2021-11-27 – 2021-11-29 (×5): 5 mg via ORAL
  Filled 2021-11-27 (×5): qty 1

## 2021-11-27 MED ORDER — INSULIN ASPART 100 UNIT/ML IJ SOLN: 0.0000 [IU] | Freq: Every day | INTRAMUSCULAR | Status: DC

## 2021-11-27 MED ORDER — VANCOMYCIN HCL IN DEXTROSE 1-5 GM/200ML-% IV SOLN
1000.0000 mg | Freq: Once | INTRAVENOUS | Status: AC
  Administered 2021-11-27: 1000 mg via INTRAVENOUS
  Filled 2021-11-27: qty 200

## 2021-11-27 MED ORDER — BISACODYL 5 MG PO TBEC: 5.0000 mg | DELAYED_RELEASE_TABLET | Freq: Every day | ORAL | Status: DC | PRN

## 2021-11-27 MED ORDER — FLUORESCEIN SODIUM 1 MG OP STRP
1.0000 | ORAL_STRIP | Freq: Once | OPHTHALMIC | Status: AC
  Administered 2021-11-27: 1 via OPHTHALMIC
  Filled 2021-11-27: qty 1

## 2021-11-27 MED ORDER — MELATONIN 5 MG PO TABS
10.0000 mg | ORAL_TABLET | Freq: Every evening | ORAL | Status: DC | PRN
  Administered 2021-11-27: 10 mg via ORAL
  Filled 2021-11-27: qty 2

## 2021-11-27 MED ORDER — MORPHINE SULFATE (PF) 2 MG/ML IV SOLN
2.0000 mg | INTRAVENOUS | Status: DC | PRN
  Administered 2021-11-27 – 2021-11-29 (×6): 2 mg via INTRAVENOUS
  Filled 2021-11-27 (×6): qty 1

## 2021-11-27 MED ORDER — VANCOMYCIN HCL 1500 MG/300ML IV SOLN: 1500.0000 mg | Freq: Once | INTRAVENOUS | Status: DC

## 2021-11-27 MED ORDER — INSULIN ASPART 100 UNIT/ML IJ SOLN
0.0000 [IU] | Freq: Three times a day (TID) | INTRAMUSCULAR | Status: DC
  Administered 2021-11-28: 2 [IU] via SUBCUTANEOUS
  Administered 2021-11-29 (×2): 3 [IU] via SUBCUTANEOUS
  Administered 2021-11-29: 5 [IU] via SUBCUTANEOUS

## 2021-11-27 MED ORDER — POLYETHYLENE GLYCOL 3350 17 G PO PACK: 17.0000 g | PACK | Freq: Every day | ORAL | Status: DC | PRN

## 2021-11-27 MED ORDER — SODIUM CHLORIDE 0.9 % IV SOLN
2.0000 g | Freq: Two times a day (BID) | INTRAVENOUS | Status: DC
  Administered 2021-11-27: 2 g via INTRAVENOUS
  Filled 2021-11-27: qty 20

## 2021-11-27 MED ORDER — ONDANSETRON HCL 4 MG/2ML IJ SOLN
4.0000 mg | Freq: Four times a day (QID) | INTRAMUSCULAR | Status: DC | PRN
  Administered 2021-11-27 – 2021-11-29 (×5): 4 mg via INTRAVENOUS
  Filled 2021-11-27 (×5): qty 2

## 2021-11-27 MED ORDER — DOCUSATE SODIUM 100 MG PO CAPS
100.0000 mg | ORAL_CAPSULE | Freq: Two times a day (BID) | ORAL | Status: DC
  Administered 2021-11-27 – 2021-11-29 (×4): 100 mg via ORAL
  Filled 2021-11-27 (×4): qty 1

## 2021-11-27 NOTE — ED Notes (Signed)
Vital signs stable. 

## 2021-11-27 NOTE — Assessment & Plan Note (Signed)
-  Hold Vyvanse - she will not be performing tasks that require attention and focus as an inpatient

## 2021-11-27 NOTE — Assessment & Plan Note (Signed)
-  Will check A1c -hold Glucophage, Trulicity -Cover with moderate-scale SSI

## 2021-11-27 NOTE — Assessment & Plan Note (Addendum)
-  Patient presenting with edema surrounding the left eye, some mild visual impairment -CT with orbital involvement involving the muscles and fat -Patient was given Rocephin/Vanc in the ER; will continue -Ophthalmology has been consulted and Dr. Posey Pronto will see the patient upon arrival if called -Will admit, as it appears the patient will need several days of treatment -There is no evidence of sepsis at this time  -Blood cultures are pending -Lovenox has been ordered and the patient has been ordered a diet due to concern low suspicion that an operative procedure will be needed   Cannot rule out herpes ophthalmicus. Will start IV acyclovir until pt is evaluated by ophthalmology Also, give the chemosis of left eye, will check TSH and FT4 to evaluate for thyroid disease.

## 2021-11-27 NOTE — ED Notes (Signed)
Pt. Is still NPO

## 2021-11-27 NOTE — Plan of Care (Signed)

## 2021-11-27 NOTE — Progress Notes (Signed)
Pt refused to wear CPAP tonight. ?

## 2021-11-27 NOTE — Progress Notes (Signed)
Pharmacy Antimicrobial Note  Catherine Trevino is a 61 y.o. 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  Pharmacy has been consulted for acyclovir dosing.  Plan: Acyclovir '750mg'$  IV Q8H. LR 125 ml/hr for renal protection.  Height: '5\' 4"'$  (162.6 cm) Weight: 99.8 kg (220 lb) IBW/kg (Calculated) : 54.7  Temp (24hrs), Avg:98 F (36.7 C), Min:97.6 F (36.4 C), Max:98.7 F (37.1 C)  Recent Labs  Lab 11/27/21 0653  WBC 10.4  CREATININE 0.87    Estimated Creatinine Clearance: 77.9 mL/min (by C-G formula based on SCr of 0.87 mg/dL).    Allergies  Allergen Reactions   Tape Hives, Dermatitis and Rash    Pt gets petechiae-like redness to areas where adhesive touches, USE PAPER TAPE    Thank you for allowing pharmacy to be a part of this patient's care.  Wynona Neat, PharmD, BCPS  11/27/2021 10:55 PM

## 2021-11-27 NOTE — ED Notes (Signed)
ED TO INPATIENT HANDOFF REPORT  ED Nurse Name and Phone #: [place] [phone]  S Name/Age/Gender Catherine Trevino 61 y.o. female Room/Bed: MH07/MH07  Code Status   Code Status: Full Code  Home/SNF/Other Home Patient oriented to: self, place, time, and situation Is this baseline? Yes   Triage Complete: Triage complete  Chief Complaint Orbital cellulitis on left [S85.462]  Triage Note Patient endorses headache that started last night with bilateral eye irritation.  Patient reports patient's left eye started swelling last night and she developed cold sweats and vomiting.  Patient's left eye swollen.     Allergies Allergies  Allergen Reactions   Tape Hives, Dermatitis and Rash    Pt gets petechiae-like redness to areas where adhesive touches, USE PAPER TAPE    Level of Care/Admitting Diagnosis ED Disposition     ED Disposition  Admit   Condition  --   [place]: [facility] [[id number]]  Level of Care: Med-Surg [16]  May admit patient to [facility] or [facility] if equivalent level of care is available:: Yes  Interfacility transfer: Yes  Covid Evaluation: Asymptomatic - no recent exposure (last 10 days) testing not required  Diagnosis: Orbital cellulitis on left [703500]  Admitting Physician: Karmen Bongo [[id number]]  Attending Physician: Karmen Bongo [[id number]]  Certification:: I certify this patient will need inpatient services for at least 2 midnights  Estimated Length of Stay: 3          B Medical/Surgery History Past Medical History:  Diagnosis Date   Anemia    ANGIODYSPLASIA, INTESTINE, 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 CPAP discontinued 12/2016 following significant weight loss.    OSA on CPAP 01/06/2016   Past Surgical History:  Procedure Laterality Date   ABDOMINAL HYSTERECTOMY     COLONOSCOPY  07/20/2020   Carlean Purl, 2016- polyps   POLYPECTOMY     TUBAL LIGATION       A IV Location/Drains/Wounds Patient Lines/Drains/Airways Status     Active Line/Drains/Airways     Name Placement date Placement time Site Days   Peripheral IV 11/27/21 20 G 1" Right Antecubital 11/27/21  0653  Antecubital  less than 1            Intake/Output Last 24 hours  Intake/Output Summary (Last 24 hours) at 11/27/2021 1756 Last data filed at 11/27/2021 1148 Gross per 24 hour  Intake 579.4 ml  Output --  Net 579.4 ml    Labs/Imaging Results for orders placed or performed during the hospital encounter of 11/27/21 (from the past 48 hour(s))  CBC with Differential     Status: None   Collection Time: 11/27/21  6:53 AM  Result Value Ref Range   WBC 10.4 4.0 - 10.5 K/uL   RBC 4.82 3.87 - 5.11 MIL/uL   Hemoglobin 13.8 12.0 - 15.0 g/dL   HCT 39.7 36.0 - 46.0 %   MCV 82.4 80.0 - 100.0 fL   MCH 28.6 26.0 - 34.0 pg   MCHC 34.8 30.0 - 36.0 g/dL   RDW 12.9 11.5 - 15.5 %   Platelets 220 150 - 400 K/uL  nRBC 0.0 0.0 - 0.2 %   Neutrophils Relative % 72 %   Neutro Abs 7.4 1.7 - 7.7 K/uL   Lymphocytes Relative 22 %   Lymphs Abs 2.3 0.7 - 4.0 K/uL   Monocytes Relative 5 %   Monocytes Absolute 0.5 0.1 - 1.0 K/uL   Eosinophils Relative 1 %   Eosinophils Absolute 0.1 0.0 - 0.5 K/uL   Basophils Relative 0 %   Basophils Absolute 0.0 0.0 - 0.1 K/uL   Immature Granulocytes 0 %   Abs Immature Granulocytes 0.03 0.00 - 0.07 K/uL    Comment: Performed at [facility], [address] [place], [place] [address]  Comprehensive metabolic panel     Status: Abnormal   Collection Time: 11/27/21  6:53 AM  Result Value Ref Range   Sodium 138 135 - 145 mmol/L   Potassium 4.1 3.5 - 5.1 mmol/L    Chloride 104 98 - 111 mmol/L   CO2 25 22 - 32 mmol/L   Glucose, Bld 136 (H) 70 - 99 mg/dL    Comment: Glucose reference range applies only to samples taken after fasting for at least 8 hours.   BUN 15 8 - 23 mg/dL   Creatinine, Ser 0.87 0.44 - 1.00 mg/dL   Calcium 9.1 8.9 - 10.3 mg/dL   Total Protein 7.3 6.5 - 8.1 g/dL   Albumin 3.7 3.5 - 5.0 g/dL   AST 20 15 - 41 U/L   ALT 25 0 - 44 U/L   Alkaline Phosphatase 81 38 - 126 U/L   Total Bilirubin 0.4 0.3 - 1.2 mg/dL   GFR, Estimated >60 >60 mL/min    Comment: (NOTE) Calculated using the CKD-EPI Creatinine Equation (2021)    Anion gap 9 5 - 15    Comment: Performed at [facility], [address] [place], [place] [address]  Sedimentation rate     Status: None   Collection Time: 11/27/21  6:53 AM  Result Value Ref Range   Sed Rate 17 0 - 22 mm/hr    Comment: Performed at [facility], [address] [place], [place] [address]   CT Orbits W Contrast  Result Date: 11/27/2021 CLINICAL DATA:  Periorbital cellulitis. Question orbital cellulitis. Headache and bilateral eye irritation beginning last night. EXAM: CT ORBITS WITH CONTRAST TECHNIQUE: Multidetector CT images was performed according to the standard protocol following intravenous contrast administration. RADIATION DOSE REDUCTION: This exam was performed according to the departmental dose-optimization program which includes automated exposure control, adjustment of the mA and/or kV according to patient size and/or use of iterative reconstruction technique. CONTRAST:  79m OMNIPAQUE IOHEXOL 300 MG/ML  SOLN COMPARISON:  None Available. FINDINGS: Orbits: Both globes appear normal except for mild axial myopia. Optic nerves are normal. No evidence of inflammatory change of the right orbit. Extra-ocular muscles and orbital fat appear normal on the right. On the left, there is mild enlargement, edema and enhancement of the lateral rectus muscle and there is mild edema the  postseptal orbital fat. These findings could be due to infectious inflammation or other processes such as thyroid eye disease or orbital pseudotumor. There is mild periorbital soft tissue swelling, left more than right. Visible paranasal sinuses: Clear. No acute or chronic inflammatory disease. Soft tissues: Other soft tissues of the region appear normal. Osseous: No abnormal osseous finding. Limited intracranial: Normal IMPRESSION: Mild periorbital soft tissue swelling, left more than right. Mild enlargement, edema and enhancement of the left  lateral rectus muscle and mild edema of the postseptal orbital fat on the left. These findings could be due to infectious inflammation or other processes such as thyroid eye disease or orbital pseudotumor. Electronically Signed   By: Nelson Chimes M.D.   On: 11/27/2021 08:07    Pending Labs Unresulted Labs (From admission, onward)     Start     Ordered   11/28/21 0277  Basic metabolic panel  Tomorrow morning,   R        11/27/21 1019   11/28/21 0500  CBC  Tomorrow morning,   R        11/27/21 1019   11/27/21 1019  Hemoglobin A1c  (Glycemic Control (SSI)  Q 4 Hours / Glycemic Control (SSI)  AC +/- HS)  Once,   URGENT       Comments: To assess prior glycemic control    11/27/21 1019   11/27/21 1017  HIV Antibody (routine testing w rflx)  (HIV Antibody (Routine testing w reflex) panel)  Once,   URGENT        11/27/21 1019   11/27/21 0842  Blood culture (routine x 2)  BLOOD CULTURE X 2,   STAT      11/27/21 0841            Vitals/Pain Today's Vitals   11/27/21 1253 11/27/21 1354 11/27/21 1500 11/27/21 1700  BP:   125/72 118/74  Pulse:   90 89  Resp:   18   Temp:  97.8 F (36.6 C)    TempSrc:  Oral    SpO2:   99% 94%  Weight:      Height:      PainSc: 3        Isolation Precautions No active isolations  Medications Medications  enoxaparin (LOVENOX) injection 40 mg (has no administration in time range)  lactated ringers infusion (  Intravenous New Bag/Given 11/27/21 1150)  acetaminophen (TYLENOL) tablet 650 mg (has no administration in time range)    Or  acetaminophen (TYLENOL) suppository 650 mg (has no administration in time range)  oxyCODONE (Oxy IR/ROXICODONE) immediate release tablet 5 mg (has no administration in time range)  morphine (PF) 2 MG/ML injection 2 mg (2 mg Intravenous Given 11/27/21 1156)  docusate sodium (COLACE) capsule 100 mg (has no administration in time range)  polyethylene glycol (MIRALAX / GLYCOLAX) packet 17 g (has no administration in time range)  bisacodyl (DULCOLAX) EC tablet 5 mg (has no administration in time range)  ondansetron (ZOFRAN) tablet 4 mg ( Oral See Alternative 11/27/21 1153)    Or  ondansetron (ZOFRAN) injection 4 mg (4 mg Intravenous Given 11/27/21 1153)  hydrALAZINE (APRESOLINE) injection 5 mg (has no administration in time range)  insulin aspart (novoLOG) injection 0-15 Units (has no administration in time range)  insulin aspart (novoLOG) injection 0-5 Units (has no administration in time range)  vancomycin (VANCOREADY) IVPB 1250 mg/250 mL (has no administration in time range)  cefTRIAXone (ROCEPHIN) 2 g in sodium chloride 0.9 % 100 mL IVPB (has no administration in time range)  tetracaine (PONTOCAINE) 0.5 % ophthalmic solution 1 drop (1 drop Left Eye Given 11/27/21 0733)  fluorescein ophthalmic strip 1 strip (1 strip Left Eye Given 11/27/21 0733)  ketorolac (TORADOL) 15 MG/ML injection 15 mg (15 mg Intravenous Given 11/27/21 0756)  iohexol (OMNIPAQUE) 300 MG/ML solution 75 mL (75 mLs Intravenous Contrast Given 11/27/21 0757)  vancomycin (VANCOCIN) IVPB 1000 mg/200 mL premix (0 mg Intravenous Stopped 11/27/21 1148)  And  vancomycin (VANCOCIN) IVPB 1000 mg/200 mL premix (0 mg Intravenous Stopped 11/27/21 1148)    Mobility walks Low fall risk   Focused Assessments   Eye Problem   R Recommendations: See Admitting Provider Note  Report given to:   Additional  Notes:

## 2021-11-27 NOTE — Assessment & Plan Note (Addendum)
-  Continue atorvastatin; resume upon arrival at destination hospital -Will hold Lovaza given limited inpatient utility

## 2021-11-27 NOTE — Assessment & Plan Note (Addendum)
Patient states she no longer uses CPAP.

## 2021-11-27 NOTE — ED Notes (Signed)
Pt. L eye is still noted with redness and edema.  Offered cool compress to Pt.

## 2021-11-27 NOTE — Assessment & Plan Note (Signed)
-  Continue Zestoretic; resume upon arrival at destination hospital

## 2021-11-27 NOTE — ED Notes (Signed)
Lab agreed to add on sedimentation rate to purple top lab tube.

## 2021-11-27 NOTE — ED Notes (Signed)
Family updated as to patient's status.

## 2021-11-27 NOTE — ED Provider Notes (Signed)
[facility] EMERGENCY DEPARTMENT Provider Note   CSN: [id number] Arrival date & time: 11/27/21  6333     History Chief Complaint  Patient presents with   Eye Problem    HPI Catherine Trevino is a 61 y.o. female presenting for eye pain and swelling.  She states that she had a headache throughout the day yesterday and rapidly progressive eye swelling throughout the night last night.  She denies fevers or chills, nausea vomiting, syncope or shortness of breath.  She did have chills throughout the night last night.  She endorses some blurring of the vision..   Patient's recorded medical, surgical, social, medication list and allergies were reviewed in the Snapshot window as part of the initial history.   Review of Systems   Review of Systems  Constitutional:  Negative for chills and fever.  HENT:  Negative for ear pain and sore throat.   Eyes:  Positive for pain and visual disturbance.  Respiratory:  Negative for cough and shortness of breath.   Cardiovascular:  Negative for chest pain and palpitations.  Gastrointestinal:  Negative for abdominal pain and vomiting.  Genitourinary:  Negative for dysuria and hematuria.  Musculoskeletal:  Negative for arthralgias and back pain.  Skin:  Negative for color change and rash.  Neurological:  Negative for seizures and syncope.  All other systems reviewed and are negative.   Physical Exam Updated Vital Signs BP (!) 148/80   Pulse 83   Temp 97.8 F (36.6 C) (Oral)   Resp 17   Ht _0  (1.626 m)   Wt 99.8 kg   SpO2 99%   BMI 37.76 kg/m  Physical Exam Vitals and nursing note reviewed.  Constitutional:      General: She is not in acute distress.    Appearance: She is well-developed.  HENT:     Head: Normocephalic and atraumatic.     Left Ear: Tympanic membrane, ear canal and external ear normal.  Eyes:     Conjunctiva/sclera: Conjunctivae normal.     Comments: Visual Acuity   L: 20:25  R:20:20  IOP  L:15:95%   External Exam  L: NAA  Fluoroscein  L: No uptake     Cardiovascular:     Rate and Rhythm: Normal rate and regular rhythm.     Heart sounds: No murmur heard. Pulmonary:     Effort: Pulmonary effort is normal. No respiratory distress.     Breath sounds: Normal breath sounds.  Abdominal:     General: There is no distension.     Palpations: Abdomen is soft.     Tenderness: There is no abdominal tenderness. There is no right CVA tenderness or left CVA tenderness.  Musculoskeletal:        General: No swelling or tenderness. Normal range of motion.     Cervical back: Neck supple.  Skin:    General: Skin is warm and dry.  Neurological:     General: No focal deficit present.     Mental Status: She is alert and oriented to person, place, and time. Mental status is at baseline.     Cranial Nerves: No cranial nerve deficit.           ED Course/ Medical Decision Making/ A&P    Procedures .Critical Care  Performed by: Tretha Sciara, MD Authorized by: Tretha Sciara, MD   Critical care provider statement:    Critical care time (minutes):  30   Critical care was time spent personally by me on  the following activities:  Development of treatment plan with patient or surrogate, discussions with consultants, evaluation of patient's response to treatment, examination of patient, ordering and review of laboratory studies, ordering and review of radiographic studies, ordering and performing treatments and interventions, pulse oximetry, re-evaluation of patient's condition and review of old charts    Medications Ordered in ED Medications  enoxaparin (LOVENOX) injection 40 mg (has no administration in time range)  lactated ringers infusion ( Intravenous New Bag/Given 11/27/21 1150)  acetaminophen (TYLENOL) tablet 650 mg (has no administration in time range)    Or  acetaminophen (TYLENOL) suppository 650 mg (has no administration in time range)  oxyCODONE (Oxy IR/ROXICODONE)  immediate release tablet 5 mg (has no administration in time range)  morphine (PF) 2 MG/ML injection 2 mg (2 mg Intravenous Given 11/27/21 1156)  docusate sodium (COLACE) capsule 100 mg (has no administration in time range)  polyethylene glycol (MIRALAX / GLYCOLAX) packet 17 g (has no administration in time range)  bisacodyl (DULCOLAX) EC tablet 5 mg (has no administration in time range)  ondansetron (ZOFRAN) tablet 4 mg ( Oral See Alternative 11/27/21 1153)    Or  ondansetron (ZOFRAN) injection 4 mg (4 mg Intravenous Given 11/27/21 1153)  hydrALAZINE (APRESOLINE) injection 5 mg (has no administration in time range)  insulin aspart (novoLOG) injection 0-15 Units (has no administration in time range)  insulin aspart (novoLOG) injection 0-5 Units (has no administration in time range)  vancomycin (VANCOREADY) IVPB 1250 mg/250 mL (has no administration in time range)  cefTRIAXone (ROCEPHIN) 2 g in sodium chloride 0.9 % 100 mL IVPB (has no administration in time range)  tetracaine (PONTOCAINE) 0.5 % ophthalmic solution 1 drop (1 drop Left Eye Given 11/27/21 0733)  fluorescein ophthalmic strip 1 strip (1 strip Left Eye Given 11/27/21 0733)  ketorolac (TORADOL) 15 MG/ML injection 15 mg (15 mg Intravenous Given 11/27/21 0756)  iohexol (OMNIPAQUE) 300 MG/ML solution 75 mL (75 mLs Intravenous Contrast Given 11/27/21 0757)  vancomycin (VANCOCIN) IVPB 1000 mg/200 mL premix (0 mg Intravenous Stopped 11/27/21 1148)    And  vancomycin (VANCOCIN) IVPB 1000 mg/200 mL premix (0 mg Intravenous Stopped 11/27/21 1148)   Medical Decision Making:    Catherine Trevino  is a 61 y.o.  who presented to the ED today with visual disturbance detailed above.    This is most consistent with an acute potentially threatening illness complicated by underlying chronic conditions.  Patient's presentation is complicated by their history of DM, age.  Patient placed on continuous vitals and telemetry monitoring while in ED which  was reviewed periodically.  Complete initial physical exam performed, notably the patient  was hemodynamically stable in no acute distress.    Notably, patient's eye exam is as above     Patient placed onto telemetry monitor while in ED which was reviewed.   Reviewed and confirmed nursing documentation for past medical history, family history, social history.    Assessment:   Patient's history of present illness and physical exam findings findings do not reveal any acute eye pathology.  Considered orbital cellulitis, acute glaucoma however patient's eyes are with normal pressures bilaterally, HSV orbital infection however patient's eyes are without reticular staining pattern, corneal abrasion/ulcer however patient's eyes are without fluorescein uptake or visual corneal abnormality on slit-lamp exam, open globe injury however patient's history of present illness and physical exam findings are grossly inconsistent.  Additionally, we considered giant cell arteritis.  Plan:  We will proceed with significant evaluation for the above  with lab work including CBC, CMP, ESR and CT orbits.   Results: ESR reassuring, CT orbits demonstrating postseptal fat inflammation.  Given pain with extraocular motions, rapid progression of symptoms, I am concerned for developing orbital cellulitis.  I called Dr. Posey Pronto with ophthalmology who agreed that the symptomology sounds consistent with developing orbital cellulitis.  He agreed with need for admission and IV antibiotics.  He stated that after completion of antibiotic course, patient could be evaluated outpatient by ophthalmology. Disposition:   Based on the above findings, I believe this patient is stable for admission.    Patient/family educated about specific findings on our evaluation and explained exact reasons for admission.  Patient/family educated about clinical situation and time was allowed to answer questions.   Admission team communicated with and  agreed with need for admission. Patient admitted. Patient  ready to move at this time.     Emergency Department Medication Summary:   Medications  enoxaparin (LOVENOX) injection 40 mg (has no administration in time range)  lactated ringers infusion ( Intravenous New Bag/Given 11/27/21 1150)  acetaminophen (TYLENOL) tablet 650 mg (has no administration in time range)    Or  acetaminophen (TYLENOL) suppository 650 mg (has no administration in time range)  oxyCODONE (Oxy IR/ROXICODONE) immediate release tablet 5 mg (has no administration in time range)  morphine (PF) 2 MG/ML injection 2 mg (2 mg Intravenous Given 11/27/21 1156)  docusate sodium (COLACE) capsule 100 mg (has no administration in time range)  polyethylene glycol (MIRALAX / GLYCOLAX) packet 17 g (has no administration in time range)  bisacodyl (DULCOLAX) EC tablet 5 mg (has no administration in time range)  ondansetron (ZOFRAN) tablet 4 mg ( Oral See Alternative 11/27/21 1153)    Or  ondansetron (ZOFRAN) injection 4 mg (4 mg Intravenous Given 11/27/21 1153)  hydrALAZINE (APRESOLINE) injection 5 mg (has no administration in time range)  insulin aspart (novoLOG) injection 0-15 Units (has no administration in time range)  insulin aspart (novoLOG) injection 0-5 Units (has no administration in time range)  vancomycin (VANCOREADY) IVPB 1250 mg/250 mL (has no administration in time range)  cefTRIAXone (ROCEPHIN) 2 g in sodium chloride 0.9 % 100 mL IVPB (has no administration in time range)  tetracaine (PONTOCAINE) 0.5 % ophthalmic solution 1 drop (1 drop Left Eye Given 11/27/21 0733)  fluorescein ophthalmic strip 1 strip (1 strip Left Eye Given 11/27/21 0733)  ketorolac (TORADOL) 15 MG/ML injection 15 mg (15 mg Intravenous Given 11/27/21 0756)  iohexol (OMNIPAQUE) 300 MG/ML solution 75 mL (75 mLs Intravenous Contrast Given 11/27/21 0757)  vancomycin (VANCOCIN) IVPB 1000 mg/200 mL premix (0 mg Intravenous Stopped 11/27/21 1148)    And   vancomycin (VANCOCIN) IVPB 1000 mg/200 mL premix (0 mg Intravenous Stopped 11/27/21 1148)        Clinical Impression:  1. Cellulitis of left orbital region      Admit   Final Clinical Impression(s) / ED Diagnoses Final diagnoses:  Cellulitis of left orbital region    Rx / DC Orders ED Discharge Orders     None         Tretha Sciara, MD 11/28/21 [phone]

## 2021-11-27 NOTE — Subjective & Objective (Signed)
CC: left eyelid swelling, pain, chills HPI: Catherine Trevino is a 61 y.o. female with past medical history of DM, HTN, class 2 obesity, and OSA on CPAP presenting with L periorbital swelling.   She states that she has been out of work conference over the weekend in [place].  She first noticed some pain behind her left eye on Saturday.  By Sunday night, she started having more severe pain in her left eye.  By early Tuesday morning, her eye had swollen shut.  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  She had a headache in her eye and she took Tylenol without improvement.    About 0430 Tuesday morning, she had nausea and cold sweats.  Denies injury.  Vision is blurry, 20/25 in that eye.  It is watery.   She came to ER for evaluation.  CT orbit showed enlargement, edema enhancement of the left lateral rectus muscle and edema of the postseptal periorbital fat.  Patient started on antibiotics with Rocephin and vancomycin.  Patient transferred to [facility] for further care.  EDP contacted Dr. Posey Pronto with ophthalmology.

## 2021-11-27 NOTE — ED Notes (Signed)
Patient's daughter called and stated that the patient's husband was on the way for a transfer to [facility].

## 2021-11-27 NOTE — H&P (Addendum)
History and Physical    Catherine Trevino YDX:[id number] DOB: 1960/04/07 DOA: 11/27/2021  DOS: the patient was seen and examined on 11/27/2021  PCP: Beverley Fiedler, FNP   Patient coming from: Home  I have personally briefly reviewed patient's old medical records in [place]  CC: left eyelid swelling, pain, chills HPI: Catherine Trevino is a 61 y.o. female with past medical history of DM, HTN, class 2 obesity, and OSA on CPAP presenting with L periorbital swelling.   She states that she has been out of work conference over the weekend in [place].  She first noticed some pain behind her left eye on Saturday.  By Sunday night, she started having more severe pain in her left eye.  By early Tuesday morning, her eye had swollen shut.  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  She had a headache in her eye and she took Tylenol without improvement.    About 0430 Tuesday morning, she had nausea and cold sweats.  Denies injury.  Vision is blurry, 20/25 in that eye.  It is watery.   She came to ER for evaluation.  CT orbit showed enlargement, edema enhancement of the left lateral rectus muscle and edema of the postseptal periorbital fat.  Patient started on antibiotics with Rocephin and vancomycin.  Patient transferred to [facility] for further care.  EDP contacted Dr. Posey Pronto with ophthalmology.   ED Course: CT orbit showed edema of the left lateral rectus muscle and postseptal orbital fat  Review of Systems:  Review of Systems  Constitutional:  Positive for chills.  HENT: Negative.    Eyes:  Positive for pain and redness.  Respiratory: Negative.    Cardiovascular: Negative.   Gastrointestinal: Negative.   Genitourinary: Negative.   Musculoskeletal: Negative.   Skin: Negative.   Neurological: Negative.   Endo/Heme/Allergies: Negative.   Psychiatric/Behavioral: Negative.    All other systems reviewed and are negative.   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 CPAP discontinued 12/2016 following significant weight loss.    OSA on CPAP 01/06/2016    Past Surgical History:  Procedure Laterality Date   ABDOMINAL HYSTERECTOMY     COLONOSCOPY  07/20/2020   Carlean Purl, 2016- polyps   POLYPECTOMY     TUBAL LIGATION       reports that she has never smoked. She has never used smokeless tobacco. She reports that she does not drink alcohol and does not use drugs.  Allergies  Allergen Reactions   Tape Hives, Dermatitis and Rash    Pt gets petechiae-like redness to areas where adhesive touches, USE PAPER TAPE    Family History  Problem Relation Age of Onset   Hypertension Father    Emphysema Maternal Grandmother    Cancer Maternal Grandfather    Hypertension Paternal Grandmother    Cancer Paternal Grandmother    Diabetes Paternal Grandmother    Hyperlipidemia Paternal Grandmother    Hypertension Paternal Grandfather    Diabetes Paternal Grandfather    Hyperlipidemia Paternal  Grandfather    Colon cancer Neg Hx    Colon polyps Neg Hx    Esophageal cancer Neg Hx    Rectal cancer Neg Hx    Stomach cancer Neg Hx     Prior to Admission medications   Medication Sig Start Date End Date Taking? Authorizing Provider  atorvastatin (LIPITOR) 10 MG tablet Take 10 mg by mouth daily. 05/15/20  Yes [provider]  ibuprofen (ADVIL) 100 MG tablet Take 400 mg by mouth every 6 (six) hours as needed for fever.   Yes [provider]  lisdexamfetamine (VYVANSE) 70 MG capsule Take 70 mg by mouth daily. 06/13/20  Yes  [provider]  lisinopril-hydrochlorothiazide (PRINZIDE,ZESTORETIC) 10-12.5 MG tablet TAKE 1 TABLET DAILY. Patient taking differently: Take 1 tablet by mouth daily. 11/19/17  Yes McVey, Gelene Mink, PA-C  metFORMIN (GLUCOPHAGE) 500 MG tablet Take 1 tablet (500 mg total) by mouth daily with breakfast. Patient taking differently: Take 500 mg by mouth 2 (two) times daily with a meal. 08/10/18  Yes Laqueta Linden, MD  Multiple Vitamins-Minerals (MULTIVITAMIN WOMEN 50+ PO) Take 1 tablet by mouth daily.   Yes [provider]  omega-3 acid ethyl esters (LOVAZA) 1 g capsule Take by mouth daily. Patient not taking: Reported on 11/27/2021    [provider]  RYBELSUS 14 MG TABS Take 1 tablet by mouth daily. Patient not taking: Reported on 11/27/2021 11/19/21   [provider]    Physical Exam: Vitals:   11/27/21 1700 11/27/21 1800 11/27/21 1816 11/27/21 1937  BP: 118/74 128/77 137/70 (!) 150/99  Pulse: 89 80 80 80  Resp:  '16 18 18  '$ Temp:  97.8 F (36.6 C) 98.1 F (36.7 C) 98.7 F (37.1 C)  TempSrc:  Oral Oral Oral  SpO2: 94% 94% 96% 97%  Weight:      Height:        Physical Exam Vitals and nursing note reviewed.  Constitutional:      General: She is not in acute distress.    Appearance: She is obese. She is not ill-appearing, toxic-appearing or diaphoretic.  HENT:     Head: Normocephalic and atraumatic.     Comments: Mild rosacea overlying her zygomatic arches.  She states that this is normal.  No vesicular lesions of her nose, forehead or preauricular area on the left side.    Nose: Nose normal.  Eyes:     Comments: Chemosis of the left eye.  See pictures.  Cardiovascular:     Rate and Rhythm: Normal rate and regular rhythm.     Pulses: Normal pulses.  Pulmonary:     Effort: Pulmonary effort is normal. No respiratory distress.     Breath sounds: Normal breath sounds. No wheezing or rales.  Abdominal:     General: Bowel sounds are  normal. There is no distension.     Palpations: Abdomen is soft.     Tenderness: There is no abdominal tenderness. There is no guarding.  Musculoskeletal:     Right lower leg: No edema.     Left lower leg: No edema.  Skin:    General: Skin is warm and dry.     Capillary Refill: Capillary refill takes less than 2 seconds.  Neurological:     General: No focal deficit present.     Mental Status: She is alert and oriented to person, place, and time.           Labs on Admission: I have personally reviewed following  labs and imaging studies  CBC: Recent Labs  Lab 11/27/21 0653  WBC 10.4  NEUTROABS 7.4  HGB 13.8  HCT 39.7  MCV 82.4  PLT 086   Basic Metabolic Panel: Recent Labs  Lab 11/27/21 0653  NA 138  K 4.1  CL 104  CO2 25  GLUCOSE 136*  BUN 15  CREATININE 0.87  CALCIUM 9.1   GFR: Estimated Creatinine Clearance: 77.9 mL/min (by C-G formula based on SCr of 0.87 mg/dL). Liver Function Tests: Recent Labs  Lab 11/27/21 0653  AST 20  ALT 25  ALKPHOS 81  BILITOT 0.4  PROT 7.3  ALBUMIN 3.7   No results for input(s): "LIPASE", "AMYLASE" in the last 168 hours. No results for input(s): "AMMONIA" in the last 168 hours. Coagulation Profile: No results for input(s): "INR", "PROTIME" in the last 168 hours. Cardiac Enzymes: No results for input(s): "CKTOTAL", "CKMB", "CKMBINDEX", "TROPONINI", "TROPONINIHS" in the last 168 hours. BNP (last 3 results) No results for input(s): "PROBNP" in the last 8760 hours. HbA1C: No results for input(s): "HGBA1C" in the last 72 hours. CBG: Recent Labs  Lab 11/27/21 1933  GLUCAP 110*   Lipid Profile: No results for input(s): "CHOL", "HDL", "LDLCALC", "TRIG", "CHOLHDL", "LDLDIRECT" in the last 72 hours. Thyroid Function Tests: No results for input(s): "TSH", "T4TOTAL", "FREET4", "T3FREE", "THYROIDAB" in the last 72 hours. Anemia Panel: No results for input(s): "VITAMINB12", "FOLATE", "FERRITIN", "TIBC", "IRON", "RETICCTPCT"  in the last 72 hours. Urine analysis:    Component Value Date/Time   COLORURINE YELLOW 03/10/2016 1050   APPEARANCEUR Clear 02/07/2017 1154   LABSPEC 1.006 03/10/2016 1050   PHURINE 8.0 03/10/2016 1050   GLUCOSEU Negative 02/07/2017 1154   HGBUR NEGATIVE 03/10/2016 1050   BILIRUBINUR Negative 02/07/2017 1154   KETONESUR NEGATIVE 03/10/2016 1050   PROTEINUR Negative 02/07/2017 1154   PROTEINUR NEGATIVE 03/10/2016 1050   UROBILINOGEN 0.2 01/16/2016 1459   UROBILINOGEN 0.2 12/18/2009 1209   NITRITE Negative 02/07/2017 1154   NITRITE NEGATIVE 03/10/2016 1050   LEUKOCYTESUR Negative 02/07/2017 1154    Radiological Exams on Admission: I have personally reviewed images CT Orbits W Contrast  Result Date: 11/27/2021 CLINICAL DATA:  Periorbital cellulitis. Question orbital cellulitis. Headache and bilateral eye irritation beginning last night. EXAM: CT ORBITS WITH CONTRAST TECHNIQUE: Multidetector CT images was performed according to the standard protocol following intravenous contrast administration. RADIATION DOSE REDUCTION: This exam was performed according to the departmental dose-optimization program which includes automated exposure control, adjustment of the mA and/or kV according to patient size and/or use of iterative reconstruction technique. CONTRAST:  23m OMNIPAQUE IOHEXOL 300 MG/ML  SOLN COMPARISON:  None Available. FINDINGS: Orbits: Both globes appear normal except for mild axial myopia. Optic nerves are normal. No evidence of inflammatory change of the right orbit. Extra-ocular muscles and orbital fat appear normal on the right. On the left, there is mild enlargement, edema and enhancement of the lateral rectus muscle and there is mild edema the postseptal orbital fat. These findings could be due to infectious inflammation or other processes such as thyroid eye disease or orbital pseudotumor. There is mild periorbital soft tissue swelling, left more than right. Visible paranasal  sinuses: Clear. No acute or chronic inflammatory disease. Soft tissues: Other soft tissues of the region appear normal. Osseous: No abnormal osseous finding. Limited intracranial: Normal IMPRESSION: Mild periorbital soft tissue swelling, left more than right. Mild enlargement, edema and enhancement of the left lateral rectus muscle and mild edema of the postseptal orbital fat on the left.  These findings could be due to infectious inflammation or other processes such as thyroid eye disease or orbital pseudotumor. Electronically Signed   By: Nelson Chimes M.D.   On: 11/27/2021 08:07    EKG: My personal interpretation of EKG shows: no EKG  Assessment/Plan Principal Problem:   Orbital cellulitis on left Active Problems:   BMI 39.0-39.9,adult   Diabetes mellitus type 2 in obese (HCC)   OSA on CPAP   Essential hypertension   Dyslipidemia   ADHD    Assessment and Plan: * Orbital cellulitis on left -Patient presenting with edema surrounding the left eye, some mild visual impairment -CT with orbital involvement involving the muscles and fat -Patient was given Rocephin/Vanc in the ER; will continue -Ophthalmology has been consulted and Dr. Posey Pronto will see the patient upon arrival if called -Will admit, as it appears the patient will need several days of treatment -There is no evidence of sepsis at this time  -Blood cultures are pending -Lovenox has been ordered and the patient has been ordered a diet due to concern low suspicion that an operative procedure will be needed   Cannot rule out herpes ophthalmicus. Will start IV acyclovir until pt is evaluated by ophthalmology Also, give the chemosis of left eye, will check TSH and FT4 to evaluate for thyroid disease.  ADHD -Hold Vyvanse - she will not be performing tasks that require attention and focus as an inpatient   Dyslipidemia -Continue atorvastatin; resume upon arrival at destination hospital -Will hold Lovaza given limited inpatient  utility  Essential hypertension -Continue Zestoretic; resume upon arrival at destination hospital  OSA on CPAP Patient states she no longer uses CPAP.  Diabetes mellitus type 2 in obese ([place]) -Will check A1c -hold Glucophage, Trulicity -Cover with moderate-scale SSI    DVT prophylaxis: SQ Heparin Code Status: Full Code Family Communication: no family at bedside  Disposition Plan: return home  Consults called: EDP has contacted Dr. Posey Pronto with ophthalmology  Admission status: Inpatient, Med-Surg   Kristopher Oppenheim, DO [facility] 11/27/2021, 10:58 PM

## 2021-11-27 NOTE — Consult Note (Signed)
Initial Consultation Note - Televisit   Patient: Catherine Trevino [id number]:[id number] DOB: April 21, 1960 PCP: Beverley Fiedler, FNP DOA: 11/27/2021 DOS: the patient was seen and examined on 11/27/2021 Primary service: Tretha Sciara, MD  Referring physician: Oswald Hillock - ED Reason for consult: L eye pain and swelling, blurriness.  Periorbital swelling, pain with EOMI.  CT with postseptal swelling and fat involvement.  No obvious systemic symptoms.  Dr. Posey Pronto agrees with orbital cellulitis, will consult but usually needs outpatient f/u.  On Vanc and Rocephin.    Assessment and Plan: * Orbital cellulitis on left -Patient presenting with edema surrounding the left eye, some mild visual impairment -CT with orbital involvement involving the muscles and fat -Patient was given Rocephin/Vanc in the ER; will continue -Ophthalmology has been consulted and Dr. Posey Pronto will see the patient upon arrival if called -Will admit, as it appears the patient will need several days of treatment -There is no evidence of sepsis at this time  -Blood cultures are pending -Lovenox has been ordered and the patient has been ordered a diet due to concern low suspicion that an operative procedure will be needed   ADHD -Hold Vyvanse - she will not be performing tasks that require attention and focus as an inpatient   Dyslipidemia -Continue atorvastatin; resume upon arrival at destination hospital -Will hold Lovaza given limited inpatient utility  Essential hypertension -Continue Zestoretic; resume upon arrival at destination hospital  OSA on CPAP -Continue CPAP  Diabetes mellitus type 2 in obese ([place]) -Will check A1c -hold Glucophage, Trulicity -Cover with moderate-scale SSI       HPI: Catherine Trevino is a 61 y.o. female with past medical history of DM, HTN, class 2 obesity, and OSA on CPAP presenting with L periorbital swelling.   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   She had a headache in her eye and she took Tylenol without improvement.  She awoke at [place] and it was swollen shut.  About 0430, she had nausea and cold sweats.  Denies injury.  Vision is blurry, 20/25 in that eye.  It is watery.    Review of Systems: As mentioned in the history of present illness. All other systems reviewed and are negative. 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 CPAP discontinued 12/2016 following significant weight loss.    OSA on CPAP 01/06/2016   Past Surgical History:  Procedure Laterality Date   ABDOMINAL HYSTERECTOMY     COLONOSCOPY  07/20/2020   Carlean Purl, 2016- polyps   POLYPECTOMY     TUBAL LIGATION     Social History:  reports that she has never smoked. She has never used smokeless tobacco. She reports that she does not drink alcohol and does not use drugs.  Allergies  Allergen Reactions   Tape Hives, Dermatitis and Rash    Pt gets petechiae-like redness to areas where adhesive touches, USE PAPER TAPE    Family History  Problem Relation Age of Onset   Hypertension Father    Emphysema Maternal Grandmother  Cancer Maternal Grandfather    Hypertension Paternal Grandmother    Cancer Paternal Grandmother    Diabetes Paternal Grandmother    Hyperlipidemia Paternal Grandmother    Hypertension Paternal Grandfather    Diabetes Paternal Grandfather    Hyperlipidemia Paternal Grandfather    Colon cancer Neg Hx    Colon polyps Neg Hx    Esophageal cancer Neg Hx    Rectal cancer Neg Hx    Stomach cancer Neg Hx     Prior to Admission medications   Medication Sig  Start Date End Date Taking? Authorizing Provider  atorvastatin (LIPITOR) 40 MG tablet Take 1 tablet by mouth daily. 05/15/20   [provider]  lisinopril-hydrochlorothiazide (PRINZIDE,ZESTORETIC) 10-12.5 MG tablet TAKE 1 TABLET DAILY. 11/19/17   McVey, Gelene Mink, PA-C  metFORMIN (GLUCOPHAGE) 500 MG tablet Take 1 tablet (500 mg total) by mouth daily with breakfast. 08/10/18   Laqueta Linden, MD  Multiple Vitamins-Minerals (MULTIVITAMIN WOMEN 50+ PO) Take 1 tablet by mouth daily.    [provider]  omega-3 acid ethyl esters (LOVAZA) 1 g capsule Take by mouth daily.    [provider]  traZODone (DESYREL) 50 MG tablet Take 50 mg by mouth at bedtime. Patient not taking: No sig reported    [provider]  TRULICITY 3.87 FI/4.3PI SOPN Inject into the skin. 07/03/20   [provider]  Vitamin D, Ergocalciferol, (DRISDOL) 1.25 MG (50000 UT) CAPS capsule Take 1 capsule (50,000 Units total) by mouth every 7 (seven) days. 08/10/18   Laqueta Linden, MD  VYVANSE 50 MG capsule Take 50 mg by mouth daily. 06/13/20   [provider]    Physical Exam: Vitals:   11/27/21 9518 11/27/21 0624 11/27/21 0715 11/27/21 0730  BP: (!) 144/86  134/77 136/74  Pulse: 83  73 77  Resp:  18 17   Temp: 97.6 F (36.4 C)     SpO2: 97%  94% 97%  Weight:  99.8 kg    Height:  _0  (1.626 m)     Physical exam performed by RN with virtual observation via FaceTime   General:  Appears calm and comfortable and is in NAD Eyes:  L eye with periorbital edema, watering, mild conjuctival injection        ENT:  grossly normal hearing, lips & tongue, mmm; appropriate dentition Neck:  no LAD, masses or thyromegaly per RN Cardiovascular:  RRR per RN. No LE edema.  Respiratory:   CTA bilaterally with no wheezes/rales/rhonchi per RN.  Normal respiratory effort. Abdomen:  soft, NT, ND per RN Skin:  no rash or induration seen on limited exam Musculoskeletal:   grossly normal tone BUE/BLE, good ROM, no bony abnormality Psychiatric:  grossly normal mood and affect, speech fluent and appropriate, AOx3 Neurologic:  CN 2-12 grossly intact other than limitations of L eye, moves all extremities in coordinated fashion    Radiological Exams on Admission: Independently reviewed - see discussion in A/P where applicable  CT Orbits W Contrast  Result Date: 11/27/2021 CLINICAL DATA:  Periorbital cellulitis. Question orbital cellulitis. Headache and bilateral eye irritation beginning last night. EXAM: CT ORBITS WITH CONTRAST TECHNIQUE: Multidetector CT images was performed according to the standard protocol following intravenous contrast administration. RADIATION DOSE REDUCTION: This exam was performed according to the departmental dose-optimization program which includes automated exposure control, adjustment of the mA and/or kV according to patient size and/or use of iterative reconstruction technique. CONTRAST:  39m OMNIPAQUE IOHEXOL 300 MG/ML  SOLN COMPARISON:  None Available. FINDINGS: Orbits: Both globes appear normal except for mild axial myopia. Optic nerves are normal. No evidence of inflammatory change of the right orbit. Extra-ocular muscles and orbital fat appear normal on the right. On the left, there is mild enlargement, edema and enhancement of the lateral rectus muscle and there is mild edema the postseptal orbital fat. These findings could be due to infectious inflammation or other processes such as thyroid eye disease or orbital pseudotumor. There is mild periorbital soft tissue swelling, left more than right. Visible paranasal sinuses: Clear. No acute or chronic inflammatory disease. Soft tissues: Other soft tissues of the region appear normal. Osseous: No abnormal osseous finding. Limited intracranial: Normal IMPRESSION: Mild periorbital soft tissue swelling, left more than right. Mild enlargement, edema and enhancement of the left lateral rectus muscle  and mild edema of the postseptal orbital fat on the left. These findings could be due to infectious inflammation or other processes such as thyroid eye disease or orbital pseudotumor. Electronically Signed   By: Nelson Chimes M.D.   On: 11/27/2021 08:07    EKG: not done   Labs on Admission: I have personally reviewed the available labs and imaging studies at the time of the admission.  Pertinent labs:    Glucose 136 Normal CBC ESR 17   Family Communication: None present Primary team communication: Patient was d/w the EDP at the time of consult and televisit was performed  Thank you very much for involving [place] in the care of your patient.  Author: Karmen Bongo, MD 11/27/2021 10:29 AM  For on call review [email or web address].

## 2021-11-27 NOTE — Progress Notes (Signed)
Pharmacy Antibiotic Note  Catherine Trevino is a 61 y.o. female admitted on 11/27/2021 with cellulitis. Pharmacy has been consulted for vancomycin dosing. Pt is afebrile and WBC is WNL. Scr is WNL.   Plan: Vancomycin 2gm IV x 1 then '1250mg'$  IV Q24H F/u renal fxn, C&S, clinical status and peak/trough at SS  Height: '5\' 4"'$  (162.6 cm) Weight: 99.8 kg (220 lb) IBW/kg (Calculated) : 54.7  Temp (24hrs), Avg:97.6 F (36.4 C), Min:97.6 F (36.4 C), Max:97.6 F (36.4 C)  Recent Labs  Lab 11/27/21 0653  WBC 10.4  CREATININE 0.87    Estimated Creatinine Clearance: 77.9 mL/min (by C-G formula based on SCr of 0.87 mg/dL).    Allergies  Allergen Reactions   Tape Hives, Dermatitis and Rash    Pt gets petechiae-like redness to areas where adhesive touches, USE PAPER TAPE    Antimicrobials this admission: Vanc 11/14>>  Dose adjustments this admission: N/A  Microbiology results: Pending  Thank you for allowing pharmacy to be a part of this patient's care.  Reagan Behlke, Rande Lawman 11/27/2021 10:27 AM

## 2021-11-27 NOTE — ED Provider Notes (Signed)
Emergency Medicine Provider Triage Evaluation Note  Catherine Trevino , a 61 y.o. female  was evaluated in triage.  Pt complains of left eye pain.  Review of Systems  Positive: Left eye pain, blurry vision, left sided headache Negative: Vision loss or trauma  Physical Exam  BP (!) 144/86 (BP Location: Left Arm)   Pulse 83   Temp 97.6 F (36.4 C)   Resp 18   Ht '5\' 4"'$  (1.626 m)   Wt 99.8 kg   SpO2 97%   BMI 37.76 kg/m  Gen:   Awake, no distress   Resp:  Normal effort  MSK:   Moves extremities without difficulty  Other:  Erythema and edema around left eye. EOMI, difficult to tell if there's proptosis or not. Pupil reactive. Mild subconjunctival edema.   Medical Decision Making  Medically screening exam initiated at 6:45 AM.  Appropriate orders placed.  Catherine Trevino was informed that the remainder of the evaluation will be completed by another provider, this initial triage assessment does not replace that evaluation, and the importance of remaining in the ED until their evaluation is complete.    Catherine Trevino, Catherine Cornea, MD 11/27/21 [phone]

## 2021-11-27 NOTE — ED Triage Notes (Signed)
Patient endorses headache that started last night with bilateral eye irritation.  Patient reports patient's left eye started swelling last night and she developed cold sweats and vomiting.  Patient's left eye swollen.

## 2021-11-28 ENCOUNTER — Inpatient Hospital Stay (HOSPITAL_COMMUNITY): Payer: BC Managed Care – PPO

## 2021-11-28 DIAGNOSIS — H05012 Cellulitis of left orbit: Secondary | ICD-10-CM | POA: Diagnosis not present

## 2021-11-28 LAB — HEMOGLOBIN A1C
Hgb A1c MFr Bld: 5.5 % (ref 4.8–5.6)
Mean Plasma Glucose: 111.15 mg/dL

## 2021-11-28 LAB — BASIC METABOLIC PANEL
Anion gap: 8 (ref 5–15)
BUN: 9 mg/dL (ref 8–23)
CO2: 27 mmol/L (ref 22–32)
Calcium: 8.8 mg/dL — ABNORMAL LOW (ref 8.9–10.3)
Chloride: 105 mmol/L (ref 98–111)
Creatinine, Ser: 0.82 mg/dL (ref 0.44–1.00)
GFR, Estimated: >60 mL/min (ref >60)
Glucose, Bld: 123 mg/dL — ABNORMAL HIGH (ref 70–99)
Potassium: 4.1 mmol/L (ref 3.5–5.1)
Sodium: 140 mmol/L (ref 135–145)

## 2021-11-28 LAB — T4, FREE: Free T4: 0.91 ng/dL (ref 0.61–1.12)

## 2021-11-28 LAB — CBC
HCT: 35.4 % — ABNORMAL LOW (ref 36.0–46.0)
Hemoglobin: 12.4 g/dL (ref 12.0–15.0)
MCH: 29.2 pg (ref 26.0–34.0)
MCHC: 35 g/dL (ref 30.0–36.0)
MCV: 83.5 fL (ref 80.0–100.0)
Platelets: 208 10*3/uL (ref 150–400)
RBC: 4.24 MIL/uL (ref 3.87–5.11)
RDW: 12.8 % (ref 11.5–15.5)
WBC: 10.6 10*3/uL — ABNORMAL HIGH (ref 4.0–10.5)
nRBC: 0 % (ref 0.0–0.2)

## 2021-11-28 LAB — GLUCOSE, CAPILLARY
Glucose-Capillary: 101 mg/dL — ABNORMAL HIGH (ref 70–99)
Glucose-Capillary: 115 mg/dL — ABNORMAL HIGH (ref 70–99)
Glucose-Capillary: 99 mg/dL (ref 70–99)
Glucose-Capillary: 141 mg/dL — ABNORMAL HIGH (ref 70–99)
Glucose-Capillary: 110 mg/dL — ABNORMAL HIGH (ref 70–99)

## 2021-11-28 LAB — TSH: TSH: 2.125 u[IU]/mL (ref 0.350–4.500)

## 2021-11-28 MED ORDER — SODIUM CHLORIDE 0.9 % IV SOLN: 6.2500 mg | Freq: Four times a day (QID) | INTRAVENOUS | Status: DC | PRN

## 2021-11-28 MED ORDER — SODIUM CHLORIDE 0.9 % IV SOLN: INTRAVENOUS | Status: DC

## 2021-11-28 NOTE — Progress Notes (Signed)
PROGRESS NOTE    Catherine Trevino  TFT:[id number] DOB: [birth year] DOA: 11/27/2021 PCP: Beverley Fiedler, FNP   Brief Narrative: 61 year old with past medical history significant for diabetes, hypertension, class II obesity, OSA on CPAP presents with left periorbital swelling.  She report noticing left eye pain that is started on Saturday.  By Sunday night she started to have severe pain in her left eye, she is a her left eye was swollen.  She reports headache went from the her eye forehead to the back.  She reports some blurry vision  CT orbits show enlargement, edema and enhancement of the lateral rectus muscle and edema of the posterior septal periorbital fat.  She was started on IV vancomycin and Rocephin.  She was transferred to [facility] for further care.  ED physician contacted Dr. Posey Pronto with ophthalmology.  I have contacted Dr. Posey Pronto for formal consult.    Assessment & Plan:   Principal Problem:   Orbital cellulitis on left Active Problems:   BMI 39.0-39.9,adult   Diabetes mellitus type 2 in obese (HCC)   OSA on CPAP   Essential hypertension   Dyslipidemia   ADHD  1-Periorbital cellulitis; left  CT orbit: Mild periorbital soft tissue swelling, left more than right.  Mild enlargement, edema and enhancement of the left lateral rectus muscle and mild edema of the posterior septal orbital fat on the left.  This finding could be due to infectious inflammation or other process such as thyroid disease. -Continue with IV vancomycin and ceftriaxone -Continue with IV acyclovir. -I have contacted Dr. Posey Pronto and informing that patient is currently at [facility].  He will come to evaluate patient. -TSH, Free T 4 normal.   ADHD: Hold Vyvanse while inpatient  Hypertension: Holding lisinopril /HCTZ BP stable 120 PRN hydralazine.   OSA on CPAP Is not longer using CPAP  Diabetes type 2 Continue with sliding scale insulin Holding metformin, semaglutide.      Estimated body  mass index is 37.76 kg/m as calculated from the following:   Height as of this encounter: '5\' 4"'$  (1.626 m).   Weight as of this encounter: 99.8 kg.   DVT prophylaxis: Lovenox Code Status: Full code Family Communication: care discussed with patient.  Disposition Plan:  Status is: Inpatient Remains inpatient appropriate because: remain inpatient for IV antibiotics.     Consultants:  Dr Posey Pronto  Procedures:    Antimicrobials:    Subjective: She report left forehead, sinus and eye pain radiates to back of head.  She thinks left periorbital swelling is worse. She is able to open a little better left eye.    Objective: Vitals:   11/27/21 1937 11/28/21 0456 11/28/21 0600 11/28/21 0942  BP: (!) 150/99 (!) 141/83  128/77  Pulse: 80 70  68  Resp: '18 16  18  '$ Temp: 98.7 F (37.1 C)  97.7 F (36.5 C) 99.2 F (37.3 C)  TempSrc: Oral  Oral Oral  SpO2: 97% 97%  94%  Weight:      Height:        Intake/Output Summary (Last 24 hours) at 11/28/2021 1510 Last data filed at 11/27/2021 1817 Gross per 24 hour  Intake 443.73 ml  Output --  Net 443.73 ml   Filed Weights   11/27/21 0624  Weight: 99.8 kg    Examination:  General exam: Appears calm and comfortable  Respiratory system: Clear to auscultation. Respiratory effort normal. Cardiovascular system: S1 & S2 heard, RRR. No JVD, murmurs, rubs, gallops or clicks.  No pedal edema. Gastrointestinal system: Abdomen is nondistended, soft and nontender. No organomegaly or masses felt. Normal bowel sounds heard. Central nervous system: Alert and oriented. No focal neurological deficits. Extremities: Symmetric 5 x 5 power. Skin: No rashes, lesions or ulcers Psychiatry: Judgement and insight appear normal. Mood & affect appropriate.     Data Reviewed: I have personally reviewed following labs and imaging studies  CBC: Recent Labs  Lab 11/27/21 0653 11/28/21 1001  WBC 10.4 10.6*  NEUTROABS 7.4  --   HGB 13.8 12.4  HCT 39.7  35.4*  MCV 82.4 83.5  PLT 220 326   Basic Metabolic Panel: Recent Labs  Lab 11/27/21 0653 11/28/21 1001  NA 138 140  K 4.1 4.1  CL 104 105  CO2 25 27  GLUCOSE 136* 123*  BUN 15 9  CREATININE 0.87 0.82  CALCIUM 9.1 8.8*   GFR: Estimated Creatinine Clearance: 82.7 mL/min (by C-G formula based on SCr of 0.82 mg/dL). Liver Function Tests: Recent Labs  Lab 11/27/21 0653  AST 20  ALT 25  ALKPHOS 81  BILITOT 0.4  PROT 7.3  ALBUMIN 3.7   No results for input(s): "LIPASE", "AMYLASE" in the last 168 hours. No results for input(s): "AMMONIA" in the last 168 hours. Coagulation Profile: No results for input(s): "INR", "PROTIME" in the last 168 hours. Cardiac Enzymes: No results for input(s): "CKTOTAL", "CKMB", "CKMBINDEX", "TROPONINI" in the last 168 hours. BNP (last 3 results) No results for input(s): "PROBNP" in the last 8760 hours. HbA1C: No results for input(s): "HGBA1C" in the last 72 hours. CBG: Recent Labs  Lab 11/27/21 1933 11/28/21 0625 11/28/21 0943 11/28/21 1201  GLUCAP 110* 101* 115* 99   Lipid Profile: No results for input(s): "CHOL", "HDL", "LDLCALC", "TRIG", "CHOLHDL", "LDLDIRECT" in the last 72 hours. Thyroid Function Tests: Recent Labs    11/28/21 0558  TSH 2.125  FREET4 0.91   Anemia Panel: No results for input(s): "VITAMINB12", "FOLATE", "FERRITIN", "TIBC", "IRON", "RETICCTPCT" in the last 72 hours. Sepsis Labs: No results for input(s): "PROCALCITON", "LATICACIDVEN" in the last 168 hours.  Recent Results (from the past 240 hour(s))  Blood culture (routine x 2)     Status: None (Preliminary result)   Collection Time: 11/27/21  8:40 AM   Specimen: BLOOD  Result Value Ref Range Status   Specimen Description   Final    BLOOD LEFT ANTECUBITAL Performed at [facility], [address] [place], [place] [address]    Special Requests   Final    BOTTLES DRAWN AEROBIC AND ANAEROBIC Blood Culture adequate volume Performed at [facility], [address] [place], [place] [address]    Culture   Final    NO GROWTH < 24 HOURS Performed at [facility], [place] [address], [place], [place] [address]    Report Status PENDING  Incomplete         Radiology Studies: CT Orbits W Contrast  Result Date: 11/27/2021 CLINICAL DATA:  Periorbital cellulitis. Question orbital cellulitis. Headache and bilateral eye irritation beginning last night. EXAM: CT ORBITS WITH CONTRAST TECHNIQUE: Multidetector CT images was performed according to the standard protocol following intravenous contrast administration. RADIATION DOSE REDUCTION: This exam was performed according to the departmental dose-optimization program which includes automated exposure control, adjustment of the mA and/or kV according to patient size and/or use of iterative reconstruction technique. CONTRAST:  20m OMNIPAQUE IOHEXOL 300 MG/ML  SOLN COMPARISON:  None Available. FINDINGS: Orbits: Both globes appear normal  except for mild axial myopia. Optic nerves are normal. No evidence of inflammatory change of the right orbit. Extra-ocular muscles and orbital fat appear normal on the right. On the left, there is mild enlargement, edema and enhancement of the lateral rectus muscle and there is mild edema the postseptal orbital fat. These findings could be due to infectious inflammation or other processes such as thyroid eye disease or orbital pseudotumor. There is mild periorbital soft tissue swelling, left more than right. Visible paranasal sinuses: Clear. No acute or chronic inflammatory disease. Soft tissues: Other soft tissues of the region appear normal. Osseous: No abnormal osseous finding. Limited intracranial: Normal IMPRESSION: Mild periorbital soft tissue swelling, left more than right. Mild enlargement, edema and enhancement of the left lateral rectus muscle and mild edema of the postseptal orbital fat on the left. These findings could be due to infectious  inflammation or other processes such as thyroid eye disease or orbital pseudotumor. Electronically Signed   By: Nelson Chimes M.D.   On: 11/27/2021 08:07        Scheduled Meds:  docusate sodium  100 mg Oral BID   enoxaparin (LOVENOX) injection  40 mg Subcutaneous Daily   insulin aspart  0-15 Units Subcutaneous TID WC   insulin aspart  0-5 Units Subcutaneous QHS   Continuous Infusions:  sodium chloride     acyclovir 750 mg (11/28/21 6333)   cefTRIAXone (ROCEPHIN)  IV 2 g (11/28/21 1008)   promethazine (PHENERGAN) injection (IM or IVPB)     vancomycin 1,250 mg (11/28/21 1119)     LOS: 1 day    Time spent: 35 minutes.     Elmarie Shiley, MD [facility]   If 7PM-7AM, please contact night-coverage [email or web address]  11/28/2021, 3:10 PM

## 2021-11-28 NOTE — Consult Note (Signed)
Catherine Trevino                                                                               11/28/2021                                               Ophthalmology Consultation                                         Consult requested by: Dr. Frederic Jericho  Reason for consultation:  Orbital Cellulitis and eye pain with movement  HPI: Patient presented to ED after noticing eye swelling (left) and pain.  She has pain with eye movement and persistent orbital edema without improvement on IV antibiotics.  She denies scratching her eyes or history of broken skin or stye/chalazion.  She denies any other problems.  Pertinent Medical History:   Active Ambulatory Problems    Diagnosis Date Noted   GERD (gastroesophageal reflux disease) 07/03/2012   BMI 39.0-39.9,adult 10/25/2014   Diabetes mellitus type 2 in obese ([place]) 10/29/2014   Hx of adenomatous colonic polyps 12/29/2014   Rosacea 01/31/2015   Lateral epicondylitis of left elbow 01/31/2015   OSA on CPAP 62/83/1517   Metabolic syndrome 61/60/7371   Hypertriglyceridemia 02/20/2016   Elevated liver enzymes 02/20/2016   Shortness of breath on exertion 04/15/2017   Other fatigue 04/15/2017   Essential hypertension 04/15/2017   Vitamin D deficiency 06/24/2017   Resolved Ambulatory Problems    Diagnosis Date Noted   ANEMIA-IRON DEFICIENCY 08/16/2009   ANGIODYSPLASIA, INTESTINE, 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      ibuprofen (ADVIL) 100 MG tablet Take 400 mg by mouth every 6 (six) hours as needed for fever.     lisdexamfetamine (VYVANSE) 70 MG capsule Take 70 mg by mouth daily.     lisinopril-hydrochlorothiazide (PRINZIDE,ZESTORETIC) 10-12.5 MG tablet TAKE 1 TABLET DAILY. (Patient taking differently: Take 1 tablet by mouth daily.) 90 tablet 3   metFORMIN (GLUCOPHAGE) 500 MG tablet Take 1 tablet (500 mg total) by mouth daily with breakfast. (Patient taking differently: Take 500 mg by mouth 2 (two) times daily with a meal.) 30 tablet 0   Multiple Vitamins-Minerals (MULTIVITAMIN WOMEN 50+ PO) Take 1 tablet by mouth daily.     omega-3 acid ethyl esters (LOVAZA) 1 g capsule Take by mouth daily. (Patient not taking: Reported on 11/27/2021)     RYBELSUS 14  MG TABS Take 1 tablet by mouth daily. (Patient not taking: Reported on 11/27/2021)          ROS:     Review of Systems  Constitutional:  Positive for chills (noted on admission - none currently).  HENT: Negative.    Eyes:  Positive for pain with eye movement and swelling Respiratory: Negative.    Cardiovascular: Negative.   Gastrointestinal: Negative.   Genitourinary: Negative.   Musculoskeletal: Negative.   Skin: Negative.   Neurological: Negative.   Endo/Heme/Allergies: Negative.   Psychiatric/Behavioral: Negative.    All other systems reviewed and are negative.  Visual Fields: FTC OU    Extraocular movments:   significant restriction and diplopia in all fields of gaze - painful ophthalmoplegia   Pupils:  no APD - Left eye dilated after evaluation      Near acuity:   J 2        J 2  TA:     Normal to palpation OU       Dilation:  left eye        Medication used [  x]Tropicamide   External:   OD:  Normal       OS:  Edema, mild eyelid erythema, chemosis   Anterior segment exam:  By penlight       Conjunctiva:  OD:  Quiet      OS:  chemosis, 1+ injection    Cornea:    OD: Clear, no fluorescein stain       OS: Clear, no fluorescein  stain      Anterior Chamber:   OD:  Deep/quiet      OS:  Deep/quiet     Iris:    OD:  Normal       OS:  Normal      Lens:    OD:  Clear         OS:  Clear          Optic disc:      OS:  Flat, sharp, pink, healthy      Central retina--examined with indirect ophthalmoscope:   OS:  Macula and vessels normal; media clear      Peripheral retina--examined with indirect ophthalmoscope with lid speculum and scleral depression:     OS:  Normal to ora 360 degrees      Impression:     Painful ophthalmoplegia with orbital edema - CT showed LR edema and post-septal edema  - consider significant orbital process (malignancy, pseudotumor, orbital inflammation, orbital cellulitis, Graves ophthalmopathy, or vascular etiology)   Recommendations/Plan:    MRI of Brain and Orbits  Immediate consult/transfer to [facility] for continued care.  Patient requires close management by oculoplastics attending.     I've discussed these findings with the nurse and PA covering the patient.  Jalene Mullet, MD

## 2021-11-28 NOTE — Progress Notes (Signed)
Patient declined CPAP. States she hasn't been wearing one at home for years. No unit in room at this time.

## 2021-11-29 DIAGNOSIS — H05012 Cellulitis of left orbit: Secondary | ICD-10-CM | POA: Diagnosis not present

## 2021-11-29 LAB — CBC
HCT: 36.4 % (ref 36.0–46.0)
Hemoglobin: 12.7 g/dL (ref 12.0–15.0)
MCH: 29 pg (ref 26.0–34.0)
MCHC: 34.9 g/dL (ref 30.0–36.0)
MCV: 83.1 fL (ref 80.0–100.0)
Platelets: 220 10*3/uL (ref 150–400)
RBC: 4.38 MIL/uL (ref 3.87–5.11)
RDW: 12.6 % (ref 11.5–15.5)
WBC: 10.1 10*3/uL (ref 4.0–10.5)
nRBC: 0 % (ref 0.0–0.2)

## 2021-11-29 LAB — BASIC METABOLIC PANEL
Anion gap: 8 (ref 5–15)
BUN: 6 mg/dL — ABNORMAL LOW (ref 8–23)
CO2: 25 mmol/L (ref 22–32)
Calcium: 8.6 mg/dL — ABNORMAL LOW (ref 8.9–10.3)
Chloride: 104 mmol/L (ref 98–111)
Creatinine, Ser: 0.79 mg/dL (ref 0.44–1.00)
GFR, Estimated: >60 mL/min (ref >60)
Glucose, Bld: 162 mg/dL — ABNORMAL HIGH (ref 70–99)
Potassium: 3.8 mmol/L (ref 3.5–5.1)
Sodium: 137 mmol/L (ref 135–145)

## 2021-11-29 LAB — GLUCOSE, CAPILLARY
Glucose-Capillary: 154 mg/dL — ABNORMAL HIGH (ref 70–99)
Glucose-Capillary: 212 mg/dL — ABNORMAL HIGH (ref 70–99)
Glucose-Capillary: 175 mg/dL — ABNORMAL HIGH (ref 70–99)

## 2021-11-29 MED ORDER — PREDNISONE 20 MG PO TABS: 30.0000 mg | ORAL_TABLET | Freq: Every day | ORAL | Status: DC

## 2021-11-29 MED ORDER — METHYLPREDNISOLONE SODIUM SUCC 125 MG IJ SOLR: 100.0000 mg | INTRAMUSCULAR | 0 refills | Status: AC

## 2021-11-29 MED ORDER — PREDNISONE 20 MG PO TABS: 60.0000 mg | ORAL_TABLET | Freq: Every day | ORAL | Status: DC

## 2021-11-29 MED ORDER — DEXTROSE 5 % IV SOLN: 750.0000 mg | Freq: Three times a day (TID) | INTRAVENOUS | 0 refills | Status: AC

## 2021-11-29 MED ORDER — PREDNISONE 20 MG PO TABS: 50.0000 mg | ORAL_TABLET | Freq: Every day | ORAL | Status: DC

## 2021-11-29 MED ORDER — PREDNISONE 20 MG PO TABS: 20.0000 mg | ORAL_TABLET | Freq: Every day | ORAL | Status: DC

## 2021-11-29 MED ORDER — METHYLPREDNISOLONE SODIUM SUCC 125 MG IJ SOLR
100.0000 mg | INTRAMUSCULAR | Status: DC
  Administered 2021-11-29: 100 mg via INTRAVENOUS
  Filled 2021-11-29: qty 2

## 2021-11-29 MED ORDER — METRONIDAZOLE 500 MG/100ML IV SOLN
500.0000 mg | Freq: Two times a day (BID) | INTRAVENOUS | Status: DC
  Administered 2021-11-29: 500 mg via INTRAVENOUS
  Filled 2021-11-29: qty 100

## 2021-11-29 MED ORDER — GADOBUTROL 1 MMOL/ML IV SOLN
10.0000 mL | Freq: Once | INTRAVENOUS | Status: AC | PRN
  Administered 2021-11-29: 10 mL via INTRAVENOUS

## 2021-11-29 MED ORDER — HYDRALAZINE HCL 20 MG/ML IJ SOLN: 5.0000 mg | INTRAMUSCULAR | Status: DC | PRN

## 2021-11-29 MED ORDER — PREDNISONE 10 MG PO TABS: 10.0000 mg | ORAL_TABLET | Freq: Every day | ORAL | Status: DC

## 2021-11-29 MED ORDER — PREDNISONE 20 MG PO TABS: 40.0000 mg | ORAL_TABLET | Freq: Every day | ORAL | Status: DC

## 2021-11-29 MED ORDER — VANCOMYCIN HCL 1250 MG/250ML IV SOLN: 1250.0000 mg | INTRAVENOUS | 0 refills | Status: AC

## 2021-11-29 MED ORDER — SODIUM CHLORIDE 0.9 % IV SOLN: 2.0000 g | INTRAVENOUS | 0 refills | Status: AC

## 2021-11-29 NOTE — Consults (Signed)
 RRT Consult start time: 2230 Consult Trigger: Consult ordered Reason for Consult: Other OSH admission O2 Saturation at start of the RRT Consult: >95% O2 Saturation at end of the RRT Consult: >95% Interventions: Plan of care reviewed Pt OSH admission.  Alert and oriented.  On room air.  IV access already established.  No concerns or needs at this time from Primary RN.  Will re consult should further needs arise. Patient outcome: Stayed in room Patient unit location at the end of the RRT Consult: 8 East Consult End time: 2235

## 2021-11-29 NOTE — Discharge Summary (Addendum)
Physician Discharge Summary   Patient: STEELE Trevino MRN: [id number] DOB: 1961-01-09  Admit date:     11/27/2021  Discharge date: 11/29/21  Discharge Physician: Elmarie Shiley   PCP: Beverley Fiedler, FNP   Recommendations at discharge:   Patient to be transfer to [facility] center for Oculoplastic care.   Discharge Diagnoses: Principal Problem:   Orbital cellulitis on left Active Problems:   BMI 39.0-39.9,adult   Diabetes mellitus type 2 in obese (HCC)   OSA on CPAP   Essential hypertension   Dyslipidemia   ADHD  Resolved Problems:   HTN (hypertension)  Hospital Course: 61 year old with past medical history significant for diabetes, hypertension, class II obesity, OSA on CPAP presents with left periorbital swelling.  She report noticing left eye pain that is started on Saturday.  By 'Sunday night she started to have severe pain in her left eye, she is a her left eye was swollen.  She reports headache went from the her eye forehead to the back.  She reports some blurry vision   CT orbits show enlargement, edema and enhancement of the lateral rectus muscle and edema of the posterior septal periorbital fat.  She was started on IV vancomycin and Rocephin.  She was transferred to [place] for further care.  ED physician contacted Dr. Patel with ophthalmology.  I have contacted Dr. Patel for formal consult.       Assessment and Plan: 1-Painful Opthalmoplegia  with Edema.  Orbital cellulitis; left vs Pseudotumor vs Vascular etiology CT orbit: Mild periorbital soft tissue swelling, left more than right.  Mild enlargement, edema and enhancement of the left lateral rectus muscle and mild edema of the posterior septal orbital fat on the left.  This finding could be due to infectious inflammation or other process such as thyroid disease. -Continue with IV vancomycin and ceftriaxone, will add flagyl.  -Continue with IV acyclovir. -Dr Patel is recommending for patient to be  transfer to [facility] for oculoplastic evaluation.  -TSH, Free T 4 normal.  -MRI brain, orbit: Edema and enhancement of the left lateral rectus muscle with edema of the surrounding orbital fat and mild enhancement of the left lacrimal gland. 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 Normal MRI of the brain. Started on IV steroids. 100 mg IV methylprednisolone Q 24 hours for 3 days the prednisone taper.,  I have contacted [facility], still waiting for bed.  I tried  [facility] today, declined due to capacity.  Dr Patel, asked for patient to be evaluated for mucor, HR , evaluate for any oral lesion. I check mouth, I dint see any ulcer or black plaque. I have asked ID to evaluate patient as well.   ADHD: Hold Vyvanse while inpatient   Hypertension: BP stable 120 PRN hydralazine.    OSA on CPAP Is not longer using CPAP   Diabetes type 2 Continue with sliding scale insulin.  Holding metformin, semaglutide.  Might need long acting insulin depending on CBG, suspect will increase on IV steroids.          Consultants: Dr Patel, Ophthalmology  Procedures performed: None Disposition:  transfer to tertiary center.  Diet recommendation:  Discharge Diet Orders (From admission, onward)     Start     Ordered   11/29/21 0000  Diet - low sodium heart healthy        11'$ /16/23 0747           Carb modified diet DISCHARGE MEDICATION: Allergies as of 11/29/2021  Reactions   Tape Hives, Dermatitis, Rash   Pt gets petechiae-like redness to areas where adhesive touches, USE PAPER TAPE        Medication List     STOP taking these medications    ibuprofen 100 MG tablet Commonly known as: ADVIL   lisdexamfetamine 70 MG capsule Commonly known as: VYVANSE   lisinopril-hydrochlorothiazide 10-12.5 MG tablet Commonly known as: ZESTORETIC   metFORMIN 500 MG tablet Commonly known as: GLUCOPHAGE   omega-3 acid ethyl esters 1 g capsule Commonly known as: LOVAZA   Rybelsus 14 MG  Tabs Generic drug: Semaglutide       TAKE these medications    acyclovir 750 mg in dextrose 5 % 250 mL Inject 750 mg into the vein every 8 (eight) hours.   atorvastatin 10 MG tablet Commonly known as: LIPITOR Take 10 mg by mouth daily.   cefTRIAXone 2 g in sodium chloride 0.9 % 100 mL Inject 2 g into the vein daily.   methylPREDNISolone sodium succinate 125 mg/2 mL injection Commonly known as: SOLU-MEDROL Inject 1.6 mLs (100 mg total) into the vein daily. Start taking on: November 30, 2021   MULTIVITAMIN WOMEN 50+ PO Take 1 tablet by mouth daily.   vancomycin 1250 MG/250ML Soln Commonly known as: VANCOREADY Inject 250 mLs (1,250 mg total) into the vein daily.        Follow-up Information     Beverley Fiedler, FNP Follow up.   Specialty: Endocrinology Contact information: [place] [address] [phone]                Discharge Exam: Catherine Trevino Weights   11/27/21 0624 11/29/21 0555  Weight: 99.8 kg 104.7 kg   General; alert; left periorbital edema, mild redness.  Patient continue to complaint of pain left eye, with movement. Some improvement of headaches.   Condition at discharge: stable  The results of significant diagnostics from this hospitalization (including imaging, microbiology, ancillary and laboratory) are listed below for reference.   Imaging Studies: MR BRAIN W WO CONTRAST  Result Date: 11/29/2021 CLINICAL DATA:  Left periorbital swelling EXAM: MRI HEAD AND ORBITS WITHOUT AND WITH CONTRAST TECHNIQUE: Multiplanar, multiecho pulse sequences of the brain and surrounding structures were obtained without and with intravenous contrast. Multiplanar, multiecho pulse sequences of the orbits and surrounding structures were obtained including fat saturation techniques, before and after intravenous contrast administration. CONTRAST:  69m GADAVIST GADOBUTROL 1 MMOL/ML IV SOLN COMPARISON:  None Available. FINDINGS: MRI HEAD FINDINGS  Brain: No acute infarct, mass effect or extra-axial collection. No acute or chronic hemorrhage. Normal white matter signal, parenchymal volume and CSF spaces. The midline structures are normal. Vascular: Major flow voids are preserved. Skull and upper cervical spine: Normal calvarium and skull base. Visualized upper cervical spine and soft tissues are normal. MRI ORBITS FINDINGS Orbits: The left lateral rectus muscle is edematous and hyperenhancing with edema of the surrounding orbital fat. There is mild nasal deviation of the left optic nerve. Mild enhancement of the left lacrimal gland. The right orbit is normal. Visualized sinuses: Normal Soft tissues: There is moderate left periorbital soft tissue swelling. IMPRESSION: 1. Edema and enhancement of the left lateral rectus muscle with edema of the surrounding orbital fat and mild enhancement of the left lacrimal gland. 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 2. Normal MRI of the brain. Electronically Signed   By: KUlyses JarredM.D.   On: 11/29/2021 01:17   MR ORBITS W WO CONTRAST  Result Date: 11/29/2021  CLINICAL DATA:  Left periorbital swelling EXAM: MRI HEAD AND ORBITS WITHOUT AND WITH CONTRAST TECHNIQUE: Multiplanar, multiecho pulse sequences of the brain and surrounding structures were obtained without and with intravenous contrast. Multiplanar, multiecho pulse sequences of the orbits and surrounding structures were obtained including fat saturation techniques, before and after intravenous contrast administration. CONTRAST:  64m GADAVIST GADOBUTROL 1 MMOL/ML IV SOLN COMPARISON:  None Available. FINDINGS: MRI HEAD FINDINGS Brain: No acute infarct, mass effect or extra-axial collection. No acute or chronic hemorrhage. Normal white matter signal, parenchymal volume and CSF spaces. The midline structures are normal. Vascular: Major flow voids are preserved. Skull and upper cervical spine: Normal calvarium and skull base. Visualized upper cervical  spine and soft tissues are normal. MRI ORBITS FINDINGS Orbits: The left lateral rectus muscle is edematous and hyperenhancing with edema of the surrounding orbital fat. There is mild nasal deviation of the left optic nerve. Mild enhancement of the left lacrimal gland. The right orbit is normal. Visualized sinuses: Normal Soft tissues: There is moderate left periorbital soft tissue swelling. IMPRESSION: 1. Edema and enhancement of the left lateral rectus muscle with edema of the surrounding orbital fat and mild enhancement of the left lacrimal gland. 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 2. Normal MRI of the brain. Electronically Signed   By: KUlyses JarredM.D.   On: 11/29/2021 01:17   CT Orbits W Contrast  Result Date: 11/27/2021 CLINICAL DATA:  Periorbital cellulitis. Question orbital cellulitis. Headache and bilateral eye irritation beginning last night. EXAM: CT ORBITS WITH CONTRAST TECHNIQUE: Multidetector CT images was performed according to the standard protocol following intravenous contrast administration. RADIATION DOSE REDUCTION: This exam was performed according to the departmental dose-optimization program which includes automated exposure control, adjustment of the mA and/or kV according to patient size and/or use of iterative reconstruction technique. CONTRAST:  762mOMNIPAQUE IOHEXOL 300 MG/ML  SOLN COMPARISON:  None Available. FINDINGS: Orbits: Both globes appear normal except for mild axial myopia. Optic nerves are normal. No evidence of inflammatory change of the right orbit. Extra-ocular muscles and orbital fat appear normal on the right. On the left, there is mild enlargement, edema and enhancement of the lateral rectus muscle and there is mild edema the postseptal orbital fat. These findings could be due to infectious inflammation or other processes such as thyroid eye disease or orbital pseudotumor. There is mild periorbital soft tissue swelling, left more than right. Visible  paranasal sinuses: Clear. No acute or chronic inflammatory disease. Soft tissues: Other soft tissues of the region appear normal. Osseous: No abnormal osseous finding. Limited intracranial: Normal IMPRESSION: Mild periorbital soft tissue swelling, left more than right. Mild enlargement, edema and enhancement of the left lateral rectus muscle and mild edema of the postseptal orbital fat on the left. These findings could be due to infectious inflammation or other processes such as thyroid eye disease or orbital pseudotumor. Electronically Signed   By: MaNelson Chimes.D.   On: 11/27/2021 08:07    Microbiology: Results for orders placed or performed during the hospital encounter of 11/27/21  Blood culture (routine x 2)     Status: None (Preliminary result)   Collection Time: 11/27/21  8:40 AM   Specimen: BLOOD  Result Value Ref Range Status   Specimen Description   Final    BLOOD LEFT ANTECUBITAL [facility]formed at [facility] [id number]  Special Requests   Final    BOTTLES DRAWN AEROBIC AND ANAEROBIC  Blood Culture adequate volume Performed at [facility], [address] [place], [place] [address]    Culture   Final    NO GROWTH < 24 HOURS Performed at [facility], [place] [address], [place], [place] [address]    Report Status PENDING  Incomplete    Labs: CBC: Recent Labs  Lab 11/27/21 0653 11/28/21 1001  WBC 10.4 10.6*  NEUTROABS 7.4  --   HGB 13.8 12.4  HCT 39.7 35.4*  MCV 82.4 83.5  PLT 220 979   Basic Metabolic Panel: Recent Labs  Lab 11/27/21 0653 11/28/21 1001  NA 138 140  K 4.1 4.1  CL 104 105  CO2 25 27  GLUCOSE 136* 123*  BUN 15 9  CREATININE 0.87 0.82  CALCIUM 9.1 8.8*   Liver Function Tests: Recent Labs  Lab 11/27/21 0653  AST 20  ALT 25  ALKPHOS 81  BILITOT 0.4  PROT 7.3  ALBUMIN 3.7   CBG: Recent Labs  Lab 11/28/21 0625 11/28/21 0943 11/28/21 1201 11/28/21 1601 11/28/21 1922  GLUCAP  101* 115* 99 141* 110*    Discharge time spent: greater than 30 minutes.  Signed: Elmarie Shiley, MD [facility] 11/29/2021

## 2021-11-29 NOTE — Progress Notes (Incomplete)
Physician Discharge Summary  Catherine Trevino HFW:[id number] DOB: [birth year] DOA: 11/27/2021  PCP: Beverley Fiedler, FNP  Admit date: 11/27/2021 Discharge date: 11/29/2021  Admitted From: Home  Disposition:  Transfer to [facility]  Discharge Condition: Stable CODE STATUS: Full  Brief/Interim Summary: 61 year old  female with past medical history significant for diabetes, hypertension, class II obesity, OSA on CPAP presents with painful left periorbital swelling and chills.    She report noticing left eye pain that is started on Saturday.  By Sunday night she started to have severe pain in her left eye and swelling.  She reports experiencing headache extending from her eye to her forehead to the back of her head.  She also reports some blurry vision   CT orbit impression: Mild periorbital soft tissue swelling, left more than right.  Mild enlargement, edema and enhancement of the left lateral rectus muscle and mild edema of the posterior septal orbital fat on the left.  This finding could be due to infectious inflammation or other process such as thyroid disease.    TSH, free T4 normal.  WBC 10.6.  Treatment for possible orbital cellulitis versus herpes ophthalmicus was started. Patient has been receiving IV vancomycin, ceftriaxone, and Aciclovir.  Dr. Posey Pronto (ophthalmologist) consulted for Painful ophthalmoplegia with orbital edema. Extraocular movements are significantly restricted and diplopia is noted in all fields of gaze - painful ophthalmoplegia.  External eye with edema, mild eyelid erythema, and chemosis. Despite IV antibiotic use, patient has pain with eye movement and persistent orbital edema without improvements. She denies scratching her eyes or history of broken skin or stye/chalazion.    Dr. Posey Pronto recommends additional MRI imaging and transferring patient [facility] for continued care and possible oculoplastic consult.  MRI of brain and orbits with and without contrast  reveal Edema and enhancement of the left lateral rectus muscle with edema of the surrounding orbital fat and mild enhancement of the left lacrimal gland. 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  [facility] is unable to accept patient due to patient capacity.  However, patient has been accepted to [facility] by Dr. Durenda Age.  Patient will be admitted under hospitalist as ophthalmologist is unable to do direct admissions.  Discharge Diagnoses:  Principal Problem:   Orbital cellulitis on left Active Problems:   BMI 39.0-39.9,adult   Diabetes mellitus type 2 in obese (HCC)   OSA on CPAP   Essential hypertension   Dyslipidemia   ADHD    Discharge Instructions   Allergies as of 11/29/2021       Reactions   Tape Hives, Dermatitis, Rash   Pt gets petechiae-like redness to areas where adhesive touches, USE PAPER TAPE     Med Rec must be completed prior to using this [facility]***       Follow-up Information     Beverley Fiedler, FNP Follow up.   Specialty: Endocrinology Contact information: [address] [place] [address] [phone]                Allergies  Allergen Reactions   Tape Hives, Dermatitis and Rash    Pt gets petechiae-like redness to areas where adhesive touches, USE PAPER TAPE    Consultations: ***Specify Physician/Group   Procedures/Studies: MR BRAIN W WO CONTRAST  Result Date: 11/29/2021 CLINICAL DATA:  Left periorbital swelling EXAM: MRI HEAD AND ORBITS WITHOUT AND WITH CONTRAST TECHNIQUE: Multiplanar, multiecho pulse sequences of the brain and surrounding structures were obtained without and with intravenous contrast. Multiplanar, multiecho pulse  sequences of the orbits and surrounding structures were obtained including fat saturation techniques, before and after intravenous contrast administration. CONTRAST:  3m GADAVIST GADOBUTROL 1 MMOL/ML IV SOLN COMPARISON:  None Available. FINDINGS: MRI HEAD FINDINGS Brain:  No acute infarct, mass effect or extra-axial collection. No acute or chronic hemorrhage. Normal white matter signal, parenchymal volume and CSF spaces. The midline structures are normal. Vascular: Major flow voids are preserved. Skull and upper cervical spine: Normal calvarium and skull base. Visualized upper cervical spine and soft tissues are normal. MRI ORBITS FINDINGS Orbits: The left lateral rectus muscle is edematous and hyperenhancing with edema of the surrounding orbital fat. There is mild nasal deviation of the left optic nerve. Mild enhancement of the left lacrimal gland. The right orbit is normal. Visualized sinuses: Normal Soft tissues: There is moderate left periorbital soft tissue swelling. IMPRESSION: 1. Edema and enhancement of the left lateral rectus muscle with edema of the surrounding orbital fat and mild enhancement of the left lacrimal gland. 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 2. Normal MRI of the brain. Electronically Signed   By: KUlyses JarredM.D.   On: 11/29/2021 01:17   MR ORBITS W WO CONTRAST  Result Date: 11/29/2021 CLINICAL DATA:  Left periorbital swelling EXAM: MRI HEAD AND ORBITS WITHOUT AND WITH CONTRAST TECHNIQUE: Multiplanar, multiecho pulse sequences of the brain and surrounding structures were obtained without and with intravenous contrast. Multiplanar, multiecho pulse sequences of the orbits and surrounding structures were obtained including fat saturation techniques, before and after intravenous contrast administration. CONTRAST:  160mGADAVIST GADOBUTROL 1 MMOL/ML IV SOLN COMPARISON:  None Available. FINDINGS: MRI HEAD FINDINGS Brain: No acute infarct, mass effect or extra-axial collection. No acute or chronic hemorrhage. Normal white matter signal, parenchymal volume and CSF spaces. The midline structures are normal. Vascular: Major flow voids are preserved. Skull and upper cervical spine: Normal calvarium and skull base. Visualized upper cervical spine  and soft tissues are normal. MRI ORBITS FINDINGS Orbits: The left lateral rectus muscle is edematous and hyperenhancing with edema of the surrounding orbital fat. There is mild nasal deviation of the left optic nerve. Mild enhancement of the left lacrimal gland. The right orbit is normal. Visualized sinuses: Normal Soft tissues: There is moderate left periorbital soft tissue swelling. IMPRESSION: 1. Edema and enhancement of the left lateral rectus muscle with edema of the surrounding orbital fat and mild enhancement of the left lacrimal gland. 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 2. Normal MRI of the brain. Electronically Signed   By: KeUlyses Jarred.D.   On: 11/29/2021 01:17   CT Orbits W Contrast  Result Date: 11/27/2021 CLINICAL DATA:  Periorbital cellulitis. Question orbital cellulitis. Headache and bilateral eye irritation beginning last night. EXAM: CT ORBITS WITH CONTRAST TECHNIQUE: Multidetector CT images was performed according to the standard protocol following intravenous contrast administration. RADIATION DOSE REDUCTION: This exam was performed according to the departmental dose-optimization program which includes automated exposure control, adjustment of the mA and/or kV according to patient size and/or use of iterative reconstruction technique. CONTRAST:  7576mMNIPAQUE IOHEXOL 300 MG/ML  SOLN COMPARISON:  None Available. FINDINGS: Orbits: Both globes appear normal except for mild axial myopia. Optic nerves are normal. No evidence of inflammatory change of the right orbit. Extra-ocular muscles and orbital fat appear normal on the right. On the left, there is mild enlargement, edema and enhancement of the lateral rectus muscle and there is mild edema the postseptal orbital fat. These findings could be due to  infectious inflammation or other processes such as thyroid eye disease or orbital pseudotumor. There is mild periorbital soft tissue swelling, left more than right. Visible  paranasal sinuses: Clear. No acute or chronic inflammatory disease. Soft tissues: Other soft tissues of the region appear normal. Osseous: No abnormal osseous finding. Limited intracranial: Normal IMPRESSION: Mild periorbital soft tissue swelling, left more than right. Mild enlargement, edema and enhancement of the left lateral rectus muscle and mild edema of the postseptal orbital fat on the left. These findings could be due to infectious inflammation or other processes such as thyroid eye disease or orbital pseudotumor. Electronically Signed   By: Nelson Chimes M.D.   On: 11/27/2021 08:07   (Echo, Carotid, EGD, Colonoscopy, ERCP)    Subjective:   Discharge Exam: Vitals:   11/28/21 0942 11/28/21 1937  BP: 128/77 (!) 154/79  Pulse: 68 68  Resp: 18 16  Temp: 99.2 F (37.3 C) 98.4 F (36.9 C)  SpO2: 94% 98%   Vitals:   11/28/21 0456 11/28/21 0600 11/28/21 0942 11/28/21 1937  BP: (!) 141/83  128/77 (!) 154/79  Pulse: 70  68 68  Resp: '16  18 16  '$ Temp:  97.7 F (36.5 C) 99.2 F (37.3 C) 98.4 F (36.9 C)  TempSrc:  Oral Oral Oral  SpO2: 97%  94% 98%  Weight:      Height:        General: Pt is alert, awake, not in acute distress Cardiovascular: RRR, S1/S2 +, no rubs, no gallops Respiratory: CTA bilaterally, no wheezing, no rhonchi Abdominal: Soft, NT, ND, bowel sounds + Extremities: no edema, no cyanosis    The results of significant diagnostics from this hospitalization (including imaging, microbiology, ancillary and laboratory) are listed below for reference.     Microbiology: Recent Results (from the past 240 hour(s))  Blood culture (routine x 2)     Status: None (Preliminary result)   Collection Time: 11/27/21  8:40 AM   Specimen: BLOOD  Result Value Ref Range Status   Specimen Description   Final    BLOOD LEFT ANTECUBITAL Performed at [facility], [address] [place], [place] [address]    Special Requests   Final    BOTTLES DRAWN AEROBIC AND  ANAEROBIC Blood Culture adequate volume Performed at [facility], [address] [place], [place] [address]    Culture   Final    NO GROWTH < 24 HOURS Performed at [facility], [place] [address], [place], [place] [address]    Report Status PENDING  Incomplete     Labs: BNP (last 3 results) No results for input(s): "BNP" in the last 8760 hours. Basic Metabolic Panel: Recent Labs  Lab 11/27/21 0653 11/28/21 1001  NA 138 140  K 4.1 4.1  CL 104 105  CO2 25 27  GLUCOSE 136* 123*  BUN 15 9  CREATININE 0.87 0.82  CALCIUM 9.1 8.8*   Liver Function Tests: Recent Labs  Lab 11/27/21 0653  AST 20  ALT 25  ALKPHOS 81  BILITOT 0.4  PROT 7.3  ALBUMIN 3.7   No results for input(s): "LIPASE", "AMYLASE" in the last 168 hours. No results for input(s): "AMMONIA" in the last 168 hours. CBC: Recent Labs  Lab 11/27/21 0653 11/28/21 1001  WBC 10.4 10.6*  NEUTROABS 7.4  --   HGB 13.8 12.4  HCT 39.7 35.4*  MCV 82.4 83.5  PLT 220 208   Cardiac Enzymes: No results for input(s): "CKTOTAL", "  CKMB", "CKMBINDEX", "TROPONINI" in the last 168 hours. BNP: Invalid input(s): "POCBNP" CBG: Recent Labs  Lab 11/28/21 0625 11/28/21 0943 11/28/21 1201 11/28/21 1601 11/28/21 1922  GLUCAP 101* 115* 99 141* 110*   D-Dimer No results for input(s): "DDIMER" in the last 72 hours. Hgb A1c No results for input(s): "HGBA1C" in the last 72 hours. Lipid Profile No results for input(s): "CHOL", "HDL", "LDLCALC", "TRIG", "CHOLHDL", "LDLDIRECT" in the last 72 hours. Thyroid function studies Recent Labs    11/28/21 0558  TSH 2.125   Anemia work up No results for input(s): "VITAMINB12", "FOLATE", "FERRITIN", "TIBC", "IRON", "RETICCTPCT" in the last 72 hours. Urinalysis    Component Value Date/Time   COLORURINE YELLOW 03/10/2016 1050   APPEARANCEUR Clear 02/07/2017 1154   LABSPEC 1.006 03/10/2016 1050   PHURINE 8.0 03/10/2016 1050   GLUCOSEU Negative 02/07/2017 1154    HGBUR NEGATIVE 03/10/2016 1050   BILIRUBINUR Negative 02/07/2017 1154   KETONESUR NEGATIVE 03/10/2016 1050   PROTEINUR Negative 02/07/2017 1154   PROTEINUR NEGATIVE 03/10/2016 1050   UROBILINOGEN 0.2 01/16/2016 1459   UROBILINOGEN 0.2 12/18/2009 1209   NITRITE Negative 02/07/2017 1154   NITRITE NEGATIVE 03/10/2016 1050   LEUKOCYTESUR Negative 02/07/2017 1154   Sepsis Labs Recent Labs  Lab 11/27/21 0653 11/28/21 1001  WBC 10.4 10.6*   Microbiology Recent Results (from the past 240 hour(s))  Blood culture (routine x 2)     Status: None (Preliminary result)   Collection Time: 11/27/21  8:40 AM   Specimen: BLOOD  Result Value Ref Range Status   Specimen Description   Final    BLOOD LEFT ANTECUBITAL Performed at [facility], [address] [place], [place] [address]    Special Requests   Final    BOTTLES DRAWN AEROBIC AND ANAEROBIC Blood Culture adequate volume Performed at [facility], [address] [place], [place] [address]    Culture   Final    NO GROWTH < 24 HOURS Performed at [facility], [place] [address], [place], [place] [address]    Report Status PENDING  Incomplete     Time coordinating discharge: Over 30 minutes  SIGNED:   Raenette Rover, DNP, [place] 11/29/2021, 5:22 AM   If 7PM-7AM, please contact night-coverage [email or web address] Password [email or web address]

## 2021-11-29 NOTE — Hospital Course (Signed)
61 year old  female with past medical history significant for diabetes, hypertension, class II obesity, OSA on CPAP presents with painful left periorbital swelling and chills.    She report noticing left eye pain that is started on Saturday.  By Sunday night she started to have severe pain in her left eye and swelling.  She reports experiencing headache extending from her eye to her forehead to the back of her head.  She also reports some blurry vision   CT orbit impression: Mild periorbital soft tissue swelling, left more than right.  Mild enlargement, edema and enhancement of the left lateral rectus muscle and mild edema of the posterior septal orbital fat on the left.  This finding could be due to infectious inflammation or other process such as thyroid disease.    TSH, free T4 normal.  WBC 10.6.  Treatment for possible orbital cellulitis versus herpes ophthalmicus was started. Patient has been receiving IV vancomycin, ceftriaxone, and Aciclovir.  Dr. Posey Pronto (ophthalmologist) consulted for Painful ophthalmoplegia with orbital edema. Extraocular movements are significantly restricted and diplopia is noted in all fields of gaze - painful ophthalmoplegia.  External eye with edema, mild eyelid erythema, and chemosis. Despite IV antibiotic use, patient has pain with eye movement and persistent orbital edema without improvements. She denies scratching her eyes or history of broken skin or stye/chalazion.    Dr. Posey Pronto recommends additional MRI imaging and transferring patient [facility] for continued care and possible oculoplastic consult.  MRI of brain and orbits with and without contrast reveal Edema and enhancement of the left lateral rectus muscle with edema of the surrounding orbital fat and mild enhancement of the left lacrimal gland. 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  [facility] is unable to accept patient due to patient capacity.  However, patient has been accepted to  [facility] by Dr. Durenda Age.  Patient will be admitted under hospitalist as ophthalmologist is unable to do direct admissions.

## 2021-11-29 NOTE — Consults (Signed)
 Consult to Ophthalmology Consult performed by: Lenton Cape, MD Consult ordered by: Deanie Almarie Lorrayne Sheree, MD     As part of the ophthalmology evaluation, the patient Catherine Trevino was dilated at 11:59PM on 11/30/2021 . The pupil exam will be unreliable for 24-48 hours depending on the dilating mix used.  When the medication wears off the pupils may not return to their baseline symmetrically. If you have any questions please feel free to contact the ophthalmology resident on call at [phone].       Ophthalmology Consult Note  History of Present Illness   Catherine Trevino is a 61 y.o. female who presented on 11/29/2021 for orbital swelling.  Service requesting consult: Internal Medicine Ophthalmology was consulted on 11/30/2021 for orbital cellulitis.  Patient is a 61 year old female with history of DM, HTN who presents after new onset of left orbital swelling. Patient states that she first felt some eye strain and headache on 11/24/21. Then on Monday, 11/26/21 her left eye started swelling and pain started worsening. When she woke up on Tuesday, 11/14, her eyes were so swollen that she had difficulty opening her eyes. Thus, she presented to [facility] ED and was admitted for IV antibiotics.   At [facility], patient was seen by an ophthalmologist and was started on treatment for orbital cellulitis and received a CT scan orbit. The CT orbit showed enlargement, edema, and enhancement of the posterior septal periorbital fat, The ophthalmologist recommended that the patient also receive a MRI, which showed edema and enhancement of left lateral rectus muscle with edema of the surrounding orbital fat and enhancement of left lacrimal gland suggestive of orbital pseudotumor.   She received IV vanc, ceftriaxone , flagyl , and acyclovir  and was started on steroids prior to transfer to [facility]. Patient feels that steroids were initially helpful as it improved her swelling to the point  she could open her eyes. However, she feels that her swelling is back to before.   Patient endorses eye pain that radiates down to left neck and shoulder, pain with eye movement worse when she tries to look right, binocular diplopia, night sweats since menopause, intentional weight loss of 40 lbs over a course of 1 year.   The patient denies fever, smoking, any purulent eye discharge, blurred vision, new floaters, flashes of light (photopsias), curtain descending over vision.  Other pertinent labs and history:  Colonoscopy 2022 - small polyps Mammogram 2023  PMHX: Diabetes, HTN. No issues with breathing problems Family history of cancer in maternal grandmother and paternal grandmother (bladder + brain tumor in grandfather in the 70s, paternal grandmother had colon cancer in late 20s) TSH 2.125, Free T4 0.91 (11/28/21) A1c 5.5% (11/27/21) ESR 17   ROS: Pertinent positives and negatives as mentioned above in HPI.    Ocular, Medical, Surgical, Family, and Social Histories    Past Ocular History:  None Eye Surgeries: None Trauma:  None Family history of eye disease:  None Last eye exam:  1 year ago for eyeglasses   Patient Active Problem List  Diagnosis  . Cellulitis of left orbital region     No past medical history on file.  No past surgical history on file.  No family history on file.      Allergies & Medications   Allergies: No Known Allergies  Outpatient Meds:  No current outpatient medications on file as of 11/30/2021. (Ophthalmic)    No current outpatient medications on file as of 11/30/2021. (Other)  Inpatient Meds:  Current Facility-Administered Medications  Medication Dose Route Frequency Provider Last Rate Last Admin  . acyclovir  (ZOVIRAX ) 750 mg in sodium chloride  0.9% 250 mL IVPB  750 mg Intravenous Q8H [facility] Murray, Evan Martin, MD 250 mL/hr at 11/30/21 0122 750 mg at 11/30/21 0122  . atorvastatin (LIPITOR) tablet 10 mg  10 mg Oral Daily Murray, Evan  Martin, MD      . bisacodyL  (DULCOLAX) EC tablet 5 mg  5 mg Oral Daily PRN Murray, Evan Martin, MD      . carboxymethylcellulose (REFRESH PLUS) 0.5 % ophthalmic solution 1 drop  1 drop LEFT Eye Q2H WA Cho, Juno, MD      . carboxymethylcellulose (REFRESH PLUS) 0.5 % ophthalmic solution 1 drop  1 drop LEFT Eye Q1H PRN Cho, Juno, MD      . cefTRIAXone  (ROCEPHIN ) 2 g in sodium chloride  0.9 % 100 mL IVPB  2 g Intravenous Q24H Murray, Evan Martin, MD      . dextrose  50% in water solution 12.5-25 g  12.5-25 g Intravenous As Directed Murray, Evan Martin, MD      . enoxaparin  (LOVENOX ) 40 mg/0.4 mL inj syringe 40 mg  40 mg Subcutaneous Daily Murray, Evan Martin, MD      . erythromycin (ROMYCIN) 5 mg/gram (0.5 %) ophthalmic ointment 0.5 inch  0.5 inch LEFT Eye Q6H [facility] Cho, Juno, MD      . glucagon (GLUCAGEN) injection 1 mg  1 mg Intramuscular As Directed Murray, Evan Martin, MD      . hydroCHLOROthiazide  (HYDRODIURIL ) tablet 12.5 mg  12.5 mg Oral Daily Murray, Evan Martin, MD      . HYDROmorphone (DILAUDID) 0.5 mg/0.5 mL inj syringe 0.5 mg  0.5 mg Intravenous Q3H PRN Murray, Evan Martin, MD   0.5 mg at 11/30/21 0252  . insulin  LISPRO (AdmeLOG, HumaLOG) injection correction dose 0-12 Units  0-12 Units Subcutaneous TID CC Murray, Evan Martin, MD      . lidocaine (XYLOCAINE) 1 % injection 0.5 mL  0.5 mL Subcutaneous As Directed Jason Artist Lunger, MD      . lisinopriL  (ZESTRIL ) tablet 10 mg  10 mg Oral Daily Murray, Evan Martin, MD      . methylPREDNISolone  (PF) (SOLU-Medrol ) 125 mg/2 mL injection 100 mg  100 mg Intramuscular Q24H Jason Artist Lunger, MD       Followed by  . [START ON 12/02/2021] predniSONE  (DELTASONE ) tablet 60 mg  60 mg Oral Daily Jason Artist Lunger, MD       Followed by  . [START ON 12/07/2021] predniSONE  (DELTASONE ) tablet 50 mg  50 mg Oral Daily Jason Artist Lunger, MD       Followed by  . [START ON 12/12/2021] predniSONE  (DELTASONE ) tablet 40 mg  40 mg Oral Daily Jason Artist Lunger,  MD       Followed by  . [START ON 12/17/2021] predniSONE  (DELTASONE ) tablet 30 mg  30 mg Oral Daily Jason Artist Lunger, MD       Followed by  . [START ON 12/22/2021] predniSONE  (DELTASONE ) tablet 20 mg  20 mg Oral Daily Jason Artist Lunger, MD       Followed by  . [START ON 12/27/2021] predniSONE  (DELTASONE ) tablet 10 mg  10 mg Oral Daily Jason Artist Lunger, MD      . metroNIDAZOLE  in NaCl (FLAGYL ) IVPB 500 mg  500 mg Intravenous Q12H [facility] Murray, Evan Martin, MD   500 mg at 11/30/21 0241  . ondansetron  (PF) (ZOFRAN ) injection 4 mg  4 mg Intravenous Q8H PRN Murray, Evan Martin, MD   4 mg at 11/30/21 0048  . oxyCODONE  (ROXICODONE ) immediate release tablet 5 mg  5 mg Oral Q4H PRN Jason Artist Lunger, MD      . polyethylene glycol (MIRALAX ) packet 17 g  17 g Oral Daily PRN Jason Artist Lunger, MD      . vancomycin  pharmacy consult   XX consult Jason Artist Lunger, MD      . white petrolatum (VASELINE) ointment   Topical BID Cho, Juno, MD        Physical Examination    Current Vital Signs 24h Vital Sign Ranges  T 36.6 C (97.9 F) (11/29/21 2216) Temp  Avg: 36.6 C (97.9 F)  Min: 36.6 C (97.9 F)  Max: 36.6 C (97.9 F)  BP   No data recorded  HR 81 (11/29/21 2216) Pulse  Avg: 81  Min: 81  Max: 81  RR 19 (11/29/21 2216) Resp  Avg: 19  Min: 19  Max: 19  O2sat     No data recorded  Weight (!) 103.8 kg (228 lb 13.4 oz) (11/29/21 2259)     Base Eye Exam     Visual Acuity       Right Left   Near Varina 20/20 20/25         Tonometry (Tonopen - [place], 12:05 AM)       Right Left   Pressure 19 28         Pupils       Dark Light Shape React   Right 3 2 Round react   Left 3 2 Round react         Visual Fields       Left Right    Full Full         Extraocular Movement       Right Left    0 0 0  0  0  0 0 0   -4 -3 -4  -4  -4  -4 -3 -4           Neuro/Psych     Oriented x3: Yes         Dilation     Both eyes: [facility] Mix (0.5% Tropicamide with 5%  Phenylephrine) - Sunglasses accepted @ 12:06 AM           Additional Tests     Color       Right Left   Ishihara 12/12 12/12           Slit Lamp and Fundus Exam     External Exam       Right Left   External Normal Periorbital ecchymoses and edema         Exophthalmometry (Base: 105 mm)       Right Left   Hertel 15 mm 20 mm         Slit Lamp Exam       Right Left   Lids/Lashes Normal Periorbital edema   Conjunctiva/Sclera White and quiet Chemosis with overlying PEE   Cornea Clear 2+ PEE   Anterior Chamber Deep and formed Deep and formed   Iris Round and reactive Round and reactive   Lens Trace NSC Trace NSC   Anterior Vitreous PVD          Fundus Exam       Right Left   Disc Sharp, +PPA Sharp, +PPA   C/D Ratio 0.3 0.3   Macula flat, attached flat,  attached   Vessels normal Normal   Periphery normal, no RD/ RT or VH normal, no RD/ RT or VH             Ancillary Studies   Imaging       No results found.  No results for input(s): WBC, HGB, HCT, PLT in the last 72 hours. No results for input(s): NA, K, CL, CO2, BUN, CREATININE, GLUCOSE in the last 72 hours.  No results found for: HGBA1C No results for input(s): APTT, INR in the last 168 hours. No results for input(s): TBILI, CONJBILI, ALKPHOS, AST, ALT, ALB, TP in the last 72 hours.   General Inflammatory labs No results found for: ESR, CRP  General Autoimmune labs No results found for: ANASCREEN No results found for: HLAB27, ROAB, LAAB, ACE, LYSOZYME, RF, ANCA  Infectious No results found for: TPALLID, PPD, LYMEELISA No results found for: NIL, TB1AGNIL, TB2AGNIL, MITOGENNIL, QUANTIFERON  Cultures No results found for: DONNAJEAN GRAFF, BFLDC, BLOODU    Assessment & Plan   Cam Harnden is a 61 y.o. 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 Symptom onset 11/24/21. Some initial improvement with steroids. VA unaffected. Patient endorses binocular diplopia. - Objective: VA 20/20 OD, 20/25 OS, IOP 19 OD, 28 OS, no rAPD, VF full by confrontation, near complete ophthalmoplegia with some mild supraduction and infraduction motor reserve left. Color vision full 12/12 OU. Hertel 15 OD, 20 OS suggesting proptotic left eye. SLE shows 2+ PEE with exposure of cheomsis.  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 Patient does have proptosis of left eye and near complete ophthalmoplegia but does not have APD, color vision deficit or decreased VA.    Recommendations:  - Obtain CXR - Obtain CRP, CBC w diff, ANA, ACE, cANCA, pANCA, lysozyme level, IgG4/ IgG levels, SPEP, Quant Gold, TSI, T3, thyrotropin - Rheumatology consult - Start Cosopt BID - Continue IV antibiotics - Continue IV steroids, hold on tapering down until improvement noted - White petroleum ophthalmic ointment QID + preservative free artificial tears q2 hour OS for exposure keratopathy   Outpatient follow up plans: TBD Please place Ambulatory Referral to Ophthalmology order on discharge. For questions regarding the appointment, the patient can call the [facility] Appointment Line at [phone] during business hours.  Ophthalmology will continue to follow while the patient is an inpatient.  Patient discussed with the attending of record, Dr. Johnston Ruth.  Earla Picking, MD PGY-2, Ophthalmology For questions, please page the on-call ophthalmology resident at [phone].   ------------------------------------------------------------------------------- Attestation signed by Ruth Johnston Lye, MD at 11/30/2021 11:13 AM Attestation Statement:   I personally saw and evaluated the patient, and participated in the management and treatment plan as documented in the resident/fellow note.  IRENE LYE RUTH, MD   I saw and  evaluated this patient. I agree with the assessment and plan as dictated in the resident's note.    61 year old female with history of diabetes/HTN who presents with acute onset left periorbital swelling & diplopia about one week ago. She was treated with IV Abx for at least 48 hours without improvement. MRI shows left lateral rectus inflammation with lacrimal gland enhancement and adjacent fat thickening/inflammation. IV solumedrol with continue IV Abx was started. Labs sent for concern of etiologies causing idiopathic orbital inflammation. Will continue medical management at this time. Rheum consult for potential autoimmune etiology.  She has no history of cancer.  -------------------------------------------------------------------------------

## 2021-11-29 NOTE — Consult Note (Signed)
[facility] for Infectious Disease    Date of Admission:  11/27/2021     Total days of antibiotics 3               Reason for Consult: Orbital cellulitis / Pseudotumor  Referring Provider: Dr. Tyrell Antonio  Primary Care Provider: Beverley Fiedler, FNP   ASSESSMENT:  Catherine Trevino is a 61 y/o female presenting with acute eye swelling and found to have painful ophthalmoplegia with orbital edema and possibility of pseudotumor. Ophthalmology has recommended transfer to tertiary facility to be followed by oculoplastics and awaiting placement at [facility]. No evidence of fungal infection and will continue current dose of vancomycin, ceftriaxone and acyclovir. Continue corticosteroids per Ophthalmology.   PLAN:  Continue vancomycin, ceftriaxone and acyclovir.  Monitor cultures for any bacteremia.  Transfer to tertiary care per Ophthalmology recommendations.  Remaining medical and supportive care per primary team.    Principal Problem:   Orbital cellulitis on left Active Problems:   BMI 39.0-39.9,adult   Diabetes mellitus type 2 in obese (HCC)   OSA on CPAP   Essential hypertension   Dyslipidemia   ADHD    docusate sodium  100 mg Oral BID   enoxaparin (LOVENOX) injection  40 mg Subcutaneous Daily   insulin aspart  0-15 Units Subcutaneous TID WC   insulin aspart  0-5 Units Subcutaneous QHS   methylPREDNISolone (SOLU-MEDROL) injection  100 mg Intravenous Q24H   Followed by   Derrill Memo ON 12/02/2021] predniSONE  60 mg Oral Q breakfast   Followed by   Derrill Memo ON 12/07/2021] predniSONE  50 mg Oral Q breakfast   Followed by   Derrill Memo ON 12/12/2021] predniSONE  40 mg Oral Q breakfast   Followed by   Derrill Memo ON 12/17/2021] predniSONE  30 mg Oral Q breakfast   Followed by   Derrill Memo ON 12/22/2021] predniSONE  20 mg Oral Q breakfast   Followed by   Derrill Memo ON 12/27/2021] predniSONE  10 mg Oral Q breakfast     HPI: Catherine Trevino is a 61 y.o. female with previous  medical history of hypertension, Type 2 diabetes, and sleep apnea presenting to the ED with headache and left eye swelling.   Catherine Trevino was working on the computer a few days prior to arrival and noted a headache behind her left eye which she attributed to possible eye strain. Continued to have headaches and was awakened in the middle of the night was awakened with eye pain and swelling. No trauma or injury. Did have a sore throat within the past several days which resolved. CT orbits with mild periorbital soft tissue swelling and mild enlargement, edema and enhancement of the left lateral rectus muscle concerning for infectious inflammation or other process. 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 Brain MRI was normal. Seen by Ophthalmology with recommendation for immediate consult/transfer to tertiary care facility to be closely managed by oculoplastics. Primary team was able to obtain transfer to [facility] and is currently on a high-priority waiting list.   Catherine Trevino has been afebrile. Blood cultures have been without growth. Started on broad spectrum coverage with vancomycin, ceftriaxone and acyclovir. Ophthalmology started corticosteroids.    Review of Systems: Review of Systems  Constitutional:  Negative for chills, fever and weight loss.  Eyes:  Positive for pain and redness.  Eye swelling  Respiratory:  Negative for cough, shortness of breath and wheezing.   Cardiovascular:  Negative for chest pain and leg swelling.  Gastrointestinal:  Negative for abdominal pain, constipation, diarrhea, nausea and vomiting.  Skin:  Negative for rash.     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 CPAP discontinued 12/2016 following significant weight loss.    OSA on CPAP 01/06/2016    Social History   Tobacco Use   Smoking status: Never   Smokeless tobacco: Never  Vaping Use   Vaping Use: Never used  Substance Use Topics   Alcohol use: No    Alcohol/week: 0.0 standard drinks of alcohol   Drug use: No    Family History  Problem Relation Age of Onset   Hypertension Father    Emphysema Maternal Grandmother    Cancer Maternal Grandfather    Hypertension Paternal Grandmother    Cancer Paternal Grandmother    Diabetes Paternal Grandmother    Hyperlipidemia Paternal Grandmother    Hypertension Paternal Grandfather    Diabetes Paternal Grandfather    Hyperlipidemia Paternal Grandfather    Colon cancer Neg Hx    Colon polyps Neg Hx    Esophageal cancer Neg Hx    Rectal cancer Neg Hx    Stomach cancer Neg Hx     Allergies  Allergen Reactions   Tape Hives, Dermatitis and Rash    Pt gets petechiae-like redness to areas where adhesive touches, USE PAPER TAPE    OBJECTIVE: Blood pressure (!) 157/83, pulse 71, temperature 97.6 F (36.4 C), temperature source Oral, resp. rate 16, height '5\' 4"'$  (1.626 m), weight 104.7 kg, SpO2 96 %.  Physical Exam Constitutional:      General: She is not in acute distress.    Appearance: She is well-developed. She is obese.  Eyes:     Comments: Left eye edema.   Cardiovascular:     Rate and Rhythm: Normal rate and regular rhythm.     Heart sounds: Normal heart sounds.  Pulmonary:     Effort: Pulmonary effort is normal.     Breath sounds: Normal breath sounds.  Skin:    General: Skin is warm and dry.  Neurological:     Mental Status: She is alert and oriented to person,  place, and time.  Psychiatric:        Mood and Affect: Mood normal.     Lab Results Lab Results  Component Value Date   WBC 10.1 11/29/2021   HGB 12.7 11/29/2021   HCT 36.4 11/29/2021   MCV 83.1 11/29/2021   PLT 220 11/29/2021    Lab Results  Component Value Date   CREATININE 0.79 11/29/2021   BUN 6 (L) 11/29/2021   NA 137 11/29/2021   K 3.8 11/29/2021   CL 104 11/29/2021   CO2 25 11/29/2021    Lab Results  Component Value Date   ALT 25 11/27/2021   AST 20 11/27/2021   ALKPHOS 81 11/27/2021   BILITOT 0.4 11/27/2021     Microbiology: Recent Results (from the  past 240 hour(s))  Blood culture (routine x 2)     Status: None (Preliminary result)   Collection Time: 11/27/21  8:40 AM   Specimen: BLOOD  Result Value Ref Range Status   Specimen Description   Final    BLOOD LEFT ANTECUBITAL Performed at [facility], [address] [place], [place] [address]    Special Requests   Final    BOTTLES DRAWN AEROBIC AND ANAEROBIC Blood Culture adequate volume Performed at [facility], [address] [place], [place] [address]    Culture   Final    NO GROWTH 2 DAYS Performed at [facility], [place] [address] [place], [place] [address]    Report Status PENDING  Incomplete     Terri Piedra, [place] [facility]  11/29/2021  12:16 PM

## 2021-11-29 NOTE — Progress Notes (Addendum)
       CROSS COVER NOTE  NAME: Catherine Trevino MRN: [id number] DOB : Apr 11, 1960    Date of Service   11/29/2021   HPI/Events of Note   2215-notified by Dr. Posey Pronto (ophthalmologist) regarding his concern for patient having significant orbital process.  Despite patient being on IV antibiotics, patient continues to have pain with eye movement and persistent orbital edema without improvement.  Dr. Posey Pronto recommends MRI of the brain and orbits with and without contrast.  He also recommends that the patient be transferred to [facility] for oculoplastic consultation  0100-[facility] transfer call center said that they are unable to accept patients at this time due to being at capacity.  This facility is unable to wait list the patient.  Dr. Posey Pronto was notified of this and he now recommends trying [facility] or [facility].  80- called  [facility] notified of MRI findings for orbital pseudotumor.  At this time he makes no new recommendations.   0500-  Dr. Durenda Age accepted transfer to [facility]. Pt to be admitted under hospitalist where ophthalmologist /oculoplastic will follow up.patient has been placed on "high priority" wait list.   0600-Dr. Posey Pronto is aware of plans for transfer.  He has started patient on steroids.  Patient placed on every 4 CBG check.  Day team to follow.   Interventions/ Plan        Raenette Rover, DNP, [place]

## 2021-11-29 NOTE — H&P (Signed)
 Hospital Medicine Admission History & Physical  Time of Service: 11/30/2021, 3:26 AM  PCP: No primary care provider on file., Phone None, Fax None   Chief Complaint  Eye pain  History of Present Illness  Catherine Trevino is a 61 y.o. female with PMH of HTN, HLD, DM2, obesity transferred here from [facility] for periorbital swelling and evaluation by occuloplastics for orbital cellulitis.  She was in her usual state of health until about Saturday. At that time  noticed some eye strain and headache. She was taking tylenol  and wasn't going away. Woke up and eye was swollen shut on Tuesday morning. She then went to ER on Tuesday morning and ended up admitted.  At OSH she had CT which showed enlargement, edema and enhancement of the lateral rectus muscle and edema of the posterior septal periorbital fat. MRI showing Edema and enhancement of the left lateral rectus muscle with edema of the surrounding orbital fat and mild enhancement of the left lacrimal gland. 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  She was admitted and started on vanc, ceftriaxone , flagyl , and acyclovir . Ophthalmology consulted and recommended starting steroids and transferring to [facility] for involvement of occuloplastics. ID evaluated for concern for fungal involvement/mucor but did not find any concerning findings.   Upon arrival she was resting comfortably in bed, VSS. Complaining of L sided facial/eye pain with painful movement.   Medical History  Past Medical History Past Medical History:  Diagnosis Date  . Hyperlipidemia   . Hypertension   . Obesity     Past Surgical History History reviewed. No pertinent surgical history.  Family History Family History  Problem Relation Age of Onset  . No Known Problems Mother   . No Known Problems Father     Social History    Allergies & Medications  Allergies No Known Allergies  Medications None     Review of Systems  A complete review of systems  was performed and is negative except as reviewed in the HPI.  Physical Exam    Current Vital Signs 24h Vital Sign Ranges  T 36.6 C (97.9 F) (11/29/21 2216) Temp  Avg: 36.6 C (97.9 F)  Min: 36.6 C (97.9 F)  Max: 36.6 C (97.9 F)  BP   No data recorded  HR 81 (11/29/21 2216) Pulse  Avg: 81  Min: 81  Max: 81  RR 19 (11/29/21 2216) Resp  Avg: 19  Min: 19  Max: 19  O2sat     No data recorded  Weight (!) 103.8 kg (228 lb 13.4 oz) (11/29/21 2259)   There is no height or weight on file to calculate BMI. Pulse 81   Temp 36.6 C (97.9 F) (Oral)   Resp 19   Wt (!) 103.8 kg (228 lb 13.4 oz)  General:  alert, active, no acute distress HEENT:  L eye with edematous  globe, injected sclera, EOM limited due to pain, still some visual acuity in L eye and pupil is 3 cm and minimally responsive to light Neck:  supple with no adenopathy, no thyromegaly, JVP not elevated Heart:  regular rate and rhythm, no murmurs, rubs, or gallops Lungs:  clear to ausculation bilaterally, no crackles, rales, or wheezes Abdomen:  soft, nontender, non-distended, no hepatosplenomegaly Extremities:  no edema or cyanosis, no peripheral edema Psych: Mood and affect are normal.     Data  No results found for this or any previous visit (from the past 24 hour(s)).  EKG:  , not performed  Radiology Studies on Admission:  MR ORBITS W WO CONTRAST  Result Date: 11/29/2021 CLINICAL DATA: Left periorbital swelling  EXAM: MRI HEAD AND ORBITS WITHOUT AND WITH CONTRAST TECHNIQUE: Multiplanar, multiecho pulse sequences of the brain and surrounding structures were obtained without and with intravenous contrast. Multiplanar, multiecho pulse sequences of the orbits and surrounding structures were obtained including fat saturation techniques, before and after intravenous contrast administration. CONTRAST: 10mL GADAVIST  GADOBUTROL  1 MMOL/ML IV SOLN COMPARISON: None Available. FINDINGS: MRI HEAD FINDINGS Brain: No acute infarct,  mass effect or extra-axial collection. No acute or chronic hemorrhage. Normal white matter signal, parenchymal volume and CSF spaces. The midline structures are normal. Vascular: Major flow voids are preserved. Skull and upper cervical spine: Normal calvarium and skull base. Visualized upper cervical spine and soft tissues are normal. MRI ORBITS FINDINGS Orbits: The left lateral rectus muscle is edematous and hyperenhancing with edema of the surrounding orbital fat. There is mild nasal deviation of the left optic nerve. Mild enhancement of the left lacrimal gland. The right orbit is normal. Visualized sinuses: Normal Soft tissues: There is moderate left periorbital soft tissue swelling. IMPRESSION: 1. Edema and enhancement of the left lateral rectus muscle with edema of the surrounding orbital fat and mild enhancement of the left lacrimal gland. 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 2. Normal MRI of the brain. Electronically Signed By: Franky Stanford M.D. On: 11/29/2021 01:17    Assessment & Plan  Catherine Trevino is a 61 y.o. female admitted for the following problems: Principal Problem:   Cellulitis of left orbital region   # Painful Opthalmoplegia with edema Transferred from [place] for occuloplastics evaluation. Differential includes orbital cellulitis 2/2 infection vs pseuodtumor. Started on broad spectrum antibiotics for concern for orbital cellulitis. Typical treatment is vanc/ceftriaxone  with addition of anaerobe coverage if severe with flagyl . Will continue here. Acyclovir  added for concern for herpes opthlamicus. Given that MRI orbit findings were showing enhancement of left lateral rectus with suggestion of orbital pseudotumor, steroids were added with some improvement of symptoms -abx: vanc, ceftriaxone , flagyl , acyclovir  -steroid taper: solumedrol 100mg  q24h -opthlamology consulted, will have occuloplastics eval in am -over read of CT and MRI ordered -optho recs: - CXR  -  Obtain CRP, CBC w diff, ANA, ACE, cANCA, pANCA, lysozyme level, IgG4/ IgG levels, SPEP, BUN/Cr, Quant Gold, TSI, T3, thyrotropin  - Start Cosopt BID  - Continue IV antibiotics  - Continue IV steroids, hold on tapering down until improvement noted  - White petroleum ophthalmic ointment QID + preservative free artificial tears 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 Held here -SSI -monitor BG with steroids  #HLD Atorva 10 qd  #ADHD Hold vyvanse  while inpatient   Comorbid Conditions: Nutritional Disorders:    Hypoalbuminemia:  Hypoalbuminemia present with lowest albumin of 3.   Hypoalbuminemia is associated with increased risk for patients.  We will attempt to treat the underlying condition(s) contributing to this low albumin state.        VTE Prophylaxis: low molecular weight heparin  Code Status: Full Code  Patient Class & Status: Inpatient, Intermediate  Discharge Planning: PT/OT eval pending    EVAN GLADIS ELBE, MD  [facility] 11/30/2021 3:26 AM   ------------------------------------------------------------------------------- Attestation with edits by Duayne Grover Kearns, MD at 11/30/2021  6:45 PM  Attending Attestation:   This service was rendered under my overall direction and control, and I  was immediately available via phone/pager or present on site. Please see progress note from 11/17 for my assessment/thoughts.   RUCHI KENN DARNEL, MD   Grover DARNEL, MD Internal Medicine/Pediatrics Hospitalist [phone]  -------------------------------------------------------------------------------

## 2021-11-29 NOTE — Plan of Care (Signed)
  Problem: Clinical Measurements: Goal: Ability to avoid or minimize complications of infection will improve Outcome: Progressing   Problem: Clinical Measurements: Goal: Ability to maintain clinical measurements within normal limits will improve Outcome: Progressing Goal: Will remain free from infection Outcome: Progressing   Problem: Pain Managment: Goal: General experience of comfort will improve Outcome: Progressing   Problem: Safety: Goal: Ability to remain free from injury will improve Outcome: Progressing

## 2021-11-30 LAB — THYROID STIMULATING IMMUNOGLOBULIN: Thyroid Stimulating Immunoglob: <0.1 IU/L (ref 0.00–0.55)

## 2021-12-02 LAB — CULTURE, BLOOD (ROUTINE X 2)
Culture: NO GROWTH
Special Requests: ADEQUATE

## 2022-01-24 NOTE — Progress Notes (Signed)
 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 No granulomatous or IgG4 concern - Labs negative  - finished up a 6 week prednisone  taper course - just finished 1.5 weeks  - Dr. Celinda (Rheum) has done CT scan without concern for sarcoid  - looks great today! Resolved no inflammation and EOMs full  - Monitor for now - Strict return precautions given - May do intraorbital steroid injection if it recurs

## 2022-05-17 ENCOUNTER — Emergency Department (HOSPITAL_BASED_OUTPATIENT_CLINIC_OR_DEPARTMENT_OTHER): Payer: BC Managed Care – PPO

## 2022-05-17 ENCOUNTER — Emergency Department (HOSPITAL_BASED_OUTPATIENT_CLINIC_OR_DEPARTMENT_OTHER)
Admission: EM | Admit: 2022-05-17 | Discharge: 2022-05-17 | Disposition: A | Discharge status: Home / Self Care | Payer: BC Managed Care – PPO | Attending: Emergency Medicine | Admitting: Emergency Medicine

## 2022-05-17 ENCOUNTER — Encounter (HOSPITAL_BASED_OUTPATIENT_CLINIC_OR_DEPARTMENT_OTHER): Payer: Self-pay | Admitting: Urology

## 2022-05-17 ENCOUNTER — Other Ambulatory Visit: Payer: Self-pay

## 2022-05-17 DIAGNOSIS — I10 Essential (primary) hypertension: Secondary | ICD-10-CM | POA: Diagnosis present

## 2022-05-17 DIAGNOSIS — Z7984 Long term (current) use of oral hypoglycemic drugs: Secondary | ICD-10-CM | POA: Diagnosis not present

## 2022-05-17 DIAGNOSIS — E119 Type 2 diabetes mellitus without complications: Secondary | ICD-10-CM | POA: Insufficient documentation

## 2022-05-17 DIAGNOSIS — Z79899 Other long term (current) drug therapy: Secondary | ICD-10-CM | POA: Insufficient documentation

## 2022-05-17 LAB — CBC
HCT: 38.1 % (ref 36.0–46.0)
Hemoglobin: 13.3 g/dL (ref 12.0–15.0)
MCH: 27.8 pg (ref 26.0–34.0)
MCHC: 34.9 g/dL (ref 30.0–36.0)
MCV: 79.7 fL — ABNORMAL LOW (ref 80.0–100.0)
Platelets: 220 10*3/uL (ref 150–400)
RBC: 4.78 MIL/uL (ref 3.87–5.11)
RDW: 12.7 % (ref 11.5–15.5)
WBC: 9 10*3/uL (ref 4.0–10.5)
nRBC: 0 % (ref 0.0–0.2)

## 2022-05-17 LAB — HEPATIC FUNCTION PANEL
ALT: 44 U/L (ref 0–44)
AST: 44 U/L — ABNORMAL HIGH (ref 15–41)
Albumin: 3.5 g/dL (ref 3.5–5.0)
Alkaline Phosphatase: 93 U/L (ref 38–126)
Bilirubin, Direct: <0.1 mg/dL (ref 0.0–0.2)
Total Bilirubin: 0.4 mg/dL (ref 0.3–1.2)
Total Protein: 6.5 g/dL (ref 6.5–8.1)

## 2022-05-17 LAB — BASIC METABOLIC PANEL
Anion gap: 10 (ref 5–15)
BUN: 20 mg/dL (ref 8–23)
CO2: 25 mmol/L (ref 22–32)
Calcium: 8.7 mg/dL — ABNORMAL LOW (ref 8.9–10.3)
Chloride: 104 mmol/L (ref 98–111)
Creatinine, Ser: 0.95 mg/dL (ref 0.44–1.00)
GFR, Estimated: >60 mL/min (ref >60)
Glucose, Bld: 151 mg/dL — ABNORMAL HIGH (ref 70–99)
Potassium: 3.5 mmol/L (ref 3.5–5.1)
Sodium: 139 mmol/L (ref 135–145)

## 2022-05-17 LAB — URINALYSIS, ROUTINE W REFLEX MICROSCOPIC
Bilirubin Urine: NEGATIVE
Glucose, UA: NEGATIVE mg/dL
Hgb urine dipstick: NEGATIVE
Ketones, ur: NEGATIVE mg/dL
Leukocytes,Ua: NEGATIVE
Nitrite: NEGATIVE
Protein, ur: NEGATIVE mg/dL
Specific Gravity, Urine: >=1.03 (ref 1.005–1.030)
pH: 5.5 (ref 5.0–8.0)

## 2022-05-17 LAB — BRAIN NATRIURETIC PEPTIDE: B Natriuretic Peptide: 33.9 pg/mL (ref 0.0–100.0)

## 2022-05-17 LAB — TROPONIN I (HIGH SENSITIVITY)
Troponin I (High Sensitivity): 4 ng/L (ref <18)
Troponin I (High Sensitivity): 4 ng/L (ref <18)

## 2022-05-17 NOTE — ED Provider Notes (Signed)
[place] [facility] Provider Note   CSN: [id number] Arrival date & time: 05/17/22  1523     History  Chief Complaint  Patient presents with   Hypertension    Catherine Trevino is a 62 y.o. female history of lateral rectus orbital mastitis, GERD, dyslipidemia, type 2 diabetes, hypertension presented with high blood pressure today.  Patient states her blood pressure the past 2 days him in the 180s and 190s however this morning was 219 after taking her metoprolol.  Patient states she only takes metoprolol for her blood pressure no other medications.  Patient dates she does see a cardiologist but has not seen her in a few months.  Patient states she does have chronic headaches however headache she has been having over the past week are different and that they go from the crown of her head to the right side of her skull.  Patient also notes that she has had some leg swelling bilaterally as well.  Patient states that she is felt short of breath recently as well with her blood pressure being elevated.  Patient denies chest pain, abdominal pain, fevers, vision changes, weakness, changes in sensation/motor skills, neck pain, recent travel/hospitalization/surgeries,  Home Medications Prior to Admission medications   Medication Sig Start Date End Date Taking? Authorizing Provider  buPROPion [facility]) 75 MG tablet Take by mouth. 10/25/16  Yes [provider]  metFORMIN (GLUCOPHAGE) 500 MG tablet Take by mouth. 12/06/21 12/06/22 Yes [provider]  olmesartan (BENICAR) 40 MG tablet Take by mouth. 03/08/22  Yes [provider]  Semaglutide 3 MG TABS Take by mouth. 12/06/21  Yes [provider]  torsemide (DEMADEX) 5 MG tablet Take by mouth. 03/08/22  Yes [provider]  acyclovir 750 mg in dextrose 5 % 250 mL Inject 750 mg into the vein every 8 (eight) hours. 11/29/21   Regalado, Belkys A, MD  atorvastatin (LIPITOR) 10  MG tablet Take 10 mg by mouth daily. 05/15/20   [provider]  cefTRIAXone 2 g in sodium chloride 0.9 % 100 mL Inject 2 g into the vein daily. 11/29/21   Regalado, Belkys A, MD  methylPREDNISolone sodium succinate (SOLU-MEDROL) 125 mg/2 mL injection Inject 1.6 mLs (100 mg total) into the vein daily. 11/30/21   Regalado, Belkys A, MD  Multiple Vitamins-Minerals (MULTIVITAMIN WOMEN 50+ PO) Take 1 tablet by mouth daily.    [provider]  vancomycin (VANCOREADY) 1250 MG/250ML SOLN Inject 250 mLs (1,250 mg total) into the vein daily. 11/29/21   Regalado, Prentiss Bells, MD      Allergies    Tape    Review of Systems   Review of Systems  Physical Exam Updated Vital Signs BP (!) 175/85 (BP Location: Left Arm)   Pulse 88   Temp 98.3 F (36.8 C) (Oral)   Resp 18   Ht 5\' 4"  (1.626 m)   Wt 104.7 kg   SpO2 94%   BMI 39.62 kg/m  Physical Exam Vitals reviewed.  Constitutional:      General: She is not in acute distress. HENT:     Head: Normocephalic and atraumatic.  Eyes:     Extraocular Movements: Extraocular movements intact.     Conjunctiva/sclera: Conjunctivae normal.     Pupils: Pupils are equal, round, and reactive to light.  Cardiovascular:     Rate and Rhythm: Normal rate and regular rhythm.     Pulses: Normal pulses.     Heart sounds: Normal heart  sounds.     Comments: 2+ bilateral radial/dorsalis pedis pulses with regular rate Pulmonary:     Effort: Pulmonary effort is normal. No respiratory distress.     Breath sounds: Normal breath sounds.  Abdominal:     Palpations: Abdomen is soft.     Tenderness: There is no abdominal tenderness. There is no guarding or rebound.  Musculoskeletal:        General: Normal range of motion.     Cervical back: Normal range of motion and neck supple.     Comments: 5 out of 5 bilateral grip/leg extension strength Bilateral 1+ pitting edema  Skin:    General: Skin is warm and dry.     Capillary Refill: Capillary refill takes  less than 2 seconds.  Neurological:     General: No focal deficit present.     Mental Status: She is alert and oriented to person, place, and time.     Sensory: Sensation is intact.     Motor: Motor function is intact.     Coordination: Coordination is intact.     Gait: Gait is intact.     Comments: Sensation intact in all 4 limbs Cranial nerves III through XII intact Vision grossly intact  Psychiatric:        Mood and Affect: Mood normal.     ED Results / Procedures / Treatments   Labs (all labs ordered are listed, but only abnormal results are displayed) Labs Reviewed  BASIC METABOLIC PANEL - Abnormal; Notable for the following components:      Result Value   Glucose, Bld 151 (*)    Calcium 8.7 (*)    All other components within normal limits  CBC - Abnormal; Notable for the following components:   MCV 79.7 (*)    All other components within normal limits  HEPATIC FUNCTION PANEL - Abnormal; Notable for the following components:   AST 44 (*)    All other components within normal limits  BRAIN NATRIURETIC PEPTIDE  URINALYSIS, ROUTINE W REFLEX MICROSCOPIC  TROPONIN I (HIGH SENSITIVITY)  TROPONIN I (HIGH SENSITIVITY)    EKG EKG Interpretation  Date/Time:  Friday May 17 2022 15:42:48 EDT Ventricular Rate:  84 PR Interval:  162 QRS Duration: 93 QT Interval:  382 QTC Calculation: 452 R Axis:   57 Text Interpretation: Sinus rhythm Confirmed by Alvino Blood ([id number]) on 05/17/2022 4:54:44 PM  Radiology DG Chest 1 View  Result Date: 05/17/2022 CLINICAL DATA:  HTN EXAM: CHEST  1 VIEW COMPARISON:  02/18/2008 FINDINGS: The heart size and mediastinal contours are within normal limits. Both lungs are clear. The visualized skeletal structures are unremarkable. IMPRESSION: No active disease. Electronically Signed   By: Judie Petit.  Shick M.D.   On: 05/17/2022 16:27   CT Head Wo Contrast  Result Date: 05/17/2022 CLINICAL DATA:  Benign intracranial hypertension HA with HTN EXAM: CT HEAD  WITHOUT CONTRAST TECHNIQUE: Contiguous axial images were obtained from the base of the skull through the vertex without intravenous contrast. RADIATION DOSE REDUCTION: This exam was performed according to the departmental dose-optimization program which includes automated exposure control, adjustment of the mA and/or kV according to patient size and/or use of iterative reconstruction technique. COMPARISON:  MRI head November 29, 2021. FINDINGS: Brain: No evidence of acute infarction, hemorrhage, hydrocephalus, extra-axial collection or mass lesion/mass effect. Vascular: No hyperdense vessel. Skull: No acute fracture. Sinuses/Orbits: Clear sinuses.  No acute orbital findings. Other: No mastoid effusions. IMPRESSION: No evidence of acute intracranial abnormality. Electronically Signed  By: Feliberto Harts M.D.   On: 05/17/2022 16:24    Procedures Procedures    Medications Ordered in ED Medications - No data to display  ED Course/ Medical Decision Making/ A&P                             Medical Decision Making Amount and/or Complexity of Data Reviewed Labs: ordered. Radiology: ordered.   Romualdo Bolk 62 y.o. presented today for elevated HTN. Working DDx that I considered at this time includes, but not limited to, HTN urgency/emergency, intracranial hemorrhage, acute renal artery stenosis, acute kidney injury, ACS, ophthalmologic emergencies.  R/o DDx: HTN urgency/emergency, intracranial hemorrhage, acute renal artery stenosis, acute kidney injury, ACS, ophthalmologic emergencies: These are considered less likely due to history of present illness and physical exam findings  Review of prior external notes: 1424 office visit  Unique Tests and My Interpretation:  CBC: Unremarkable BMP: Unremarkable Hepatic function panel: Troponin: 4, 4 BNP: Negative EKG: Rate, rhythm, axis, intervals all examined and without medically relevant abnormality. ST segments without concerns for  elevations UA: Unremarkable CT Head w/o Contrast: Unremarkable Chest x-ray: No acute cardiopulmonary changes  Discussion with Independent Historian: None  Discussion of Management of Tests: None  Risk: Low: based on diagnostic testing/clinical impression and treatment plan  Risk Stratification Score: None  Plan: Patient presented for HTN. On exam patient was in no acute distress with stable vitals.  Patient's blood pressure on arrival was 175/85 and in the room was 168.  Patient had unremarkable neuroexam and on physical exam patient had minor bilateral 1+ pitting edema.  Patient did endorse minor shortness of breath with her blood pressure being elevated and so a BNP was ordered to evaluate for possible new onset heart failure causing patient's symptoms.  In regards to patient's blood pressure I have a low suspicion for any life-threatening sequela at this time but will obtain CT head along with labs and chest x-ray.  Patient stable at this time.  Patient's labs and imaging are reassuring at this time and patient be discharged with outpatient follow-up.  Spoke to the patient about taking her medications as prescribed and the importance of monitoring blood pressure is long-term sequelae can result in kidney damage, strokes, heart attacks, death.  Patient's blood pressure during ED course was never elevated above 175/85 and patient has been asymptomatic throughout ED course.  Patient was given return precautions. Patient stable for discharge at this time.  Patient verbalized understanding of plan.         Final Clinical Impression(s) / ED Diagnoses Final diagnoses:  Uncontrolled hypertension    Rx / DC Orders ED Discharge Orders          Ordered    Ambulatory referral to Cardiology       Comments: If you have not heard from the Cardiology office within the next 72 hours please call [phone].   05/17/22 1935              Remi Deter 05/17/22 1936     Lonell Grandchild, MD 05/18/22 [phone]

## 2022-05-17 NOTE — Discharge Instructions (Addendum)
Please follow-up with the cardiologist I have attached your for you.  They should call you in 72 hours and if you do not get a phone call by them please call them.  Please continue take your blood pressure medications as prescribed and monitor your blood pressure.  If you begin to have headaches, vision changes, neck pain, chest pain, change in sensation or other worsening symptoms please return to ER.

## 2022-05-17 NOTE — ED Triage Notes (Signed)
Pt states BP elevated x 1 week with associated headache  States BP 219/109 at home  BP trending down now at triage   Started on metoprolol Monday and took this am

## 2022-06-25 NOTE — Progress Notes (Unsigned)
Cardiology Office Note:   Date:  06/27/2022  NAME:  Catherine BARBUTO    MRN: [id number] DOB:  [birth year]   PCP:  Felix Pacini, FNP  Cardiologist:  None  Electrophysiologist:  None   Referring MD: Netta Corrigan, PA-C   Chief Complaint  Patient presents with   Abnormal ECG         History of Present Illness:   Catherine Trevino is a 62 y.o. female with a hx of DM, HTN, obesity who is being seen today for the evaluation of abnormal EKG at the request of Levan Hurst Anastasia Fiedler, FNP.  She reports over the past 3 to 4 months her blood pressures become severely elevated.  She was followed by cardiologist, Dr. Mercy Riding at [facility].  She reports she was not able to see him due to scheduling so she requested to see [place].  She was actually in the emergency room for severely elevated blood pressure on 05/17/2022.  Current regimen includes lisinopril 40 mg daily and HCTZ 25 mg daily.  Blood pressure remains 140s to 160s at home.  She has a log.  She denies any chest pain but does get short of breath with activity.  She is obese with a BMI of 41.  She reports that she was told she had sleep apnea years ago but did not need to sleep machine.  She tells me she snores.  She is unsure if she stops breathing.  CV examination largely unremarkable.  She does not smoke.  Denies any alcohol or drug use.  She works for [employer].  She is diabetic but her A1c is very well-controlled.  She did suffer from inflammation of the eye.  Evaluated at [facility].  No definitive diagnosis.  Rheumatologic workup was negative.  She tells me she watches her salt intake.  She is doing some light walking but no rigorous exercise.  She can walk up to 1 mile per day without any significant chest pains or trouble breathing.  Of note she is on Vyvanse.  We discussed this could result in elevated blood pressure.  She actually takes this to curb her appetite.  We discussed possibly changing to an alternative  agent.  She does not eat a lot of fast food.  We did discuss salt reduction strategies.  Blood pressure today 138/96.  It seems to be better than it has been.  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 CPAP discontinued 12/2016 following significant weight loss.    OSA on CPAP 01/06/2016    Past Surgical History: Past Surgical History:  Procedure Laterality Date   ABDOMINAL HYSTERECTOMY     COLONOSCOPY  07/20/2020   Leone Payor, 2016- polyps   POLYPECTOMY     TUBAL LIGATION      Current Medications: Current Meds  Medication Sig   acyclovir 750 mg in dextrose 5 % 250 mL Inject 750 mg into the vein every 8 (eight) hours.  atorvastatin (LIPITOR) 10 MG tablet Take 10 mg by mouth daily.   buPROPion (WELLBUTRIN) 75 MG tablet Take by mouth.   carvedilol (COREG) 12.5 MG tablet Take 1 tablet (12.5 mg total) by mouth 2 (two) times daily.   cefTRIAXone 2 g in sodium chloride 0.9 % 100 mL Inject 2 g into the vein daily.   hydrochlorothiazide (HYDRODIURIL) 25 MG tablet Take 25 mg by mouth daily.   lisdexamfetamine (VYVANSE) 70 MG capsule Take 70 mg by mouth daily.   lisinopril (ZESTRIL) 40 MG tablet Take 40 mg by mouth daily.   metFORMIN (GLUCOPHAGE) 500 MG tablet Take by mouth.   methylPREDNISolone sodium succinate (SOLU-MEDROL) 125 mg/2 mL injection Inject 1.6 mLs (100 mg total) into the vein daily.   Multiple Vitamins-Minerals (MULTIVITAMIN WOMEN 50+ PO) Take 1 tablet by mouth daily.   RYBELSUS 14 MG TABS Take 1  tablet by mouth daily.   Semaglutide 3 MG TABS Take by mouth.   vancomycin (VANCOREADY) 1250 MG/250ML SOLN Inject 250 mLs (1,250 mg total) into the vein daily.     Allergies:    Silicone and Tape   Social History: Social History   Socioeconomic History   Marital status: Married    Spouse name: Not on file   Number of children: 3   Years of education: Master's Degree   Highest education level: Not on file  Occupational History   Occupation: Production designer, theatre/television/film    Comment: [employer]  Tobacco Use   Smoking status: Never   Smokeless tobacco: Never  Vaping Use   Vaping Use: Never used  Substance and Sexual Activity   Alcohol use: No    Alcohol/week: 0.0 standard drinks of alcohol   Drug use: No   Sexual activity: Never    Partners: Male    Comment: husband is impotent, no other partners  Other Topics Concern   Not on file  Social History Narrative   Informally separated from husband since 11/2016.   Lives alone, with her dog.   Social Determinants of Health   Financial Resource Strain: Not on file  Food Insecurity: No Food Insecurity (11/27/2021)   Hunger Vital Sign    Worried About Running Out of Food in the Last Year: Never true    Ran Out of Food in the Last Year: Never true  Transportation Needs: No Transportation Needs (11/27/2021)   PRAPARE - Administrator, Civil Service (Medical): No    Lack of Transportation (Non-Medical): No  Physical Activity: Not on file  Stress: Not on file  Social Connections: Not on file     Family History: The patient's family history includes Cancer in her maternal grandfather and paternal grandmother; Diabetes in her paternal grandfather and paternal grandmother; Emphysema in her maternal grandmother; Hyperlipidemia in her paternal grandfather and paternal grandmother; Hypertension in her father, paternal grandfather, and paternal grandmother. There is no history of Colon cancer, Colon polyps, Esophageal cancer,  Rectal cancer, or Stomach cancer.  ROS:   All other ROS reviewed and negative. Pertinent positives noted in the HPI.     EKGs/Labs/Other Studies Reviewed:   The following studies were personally reviewed by me today:  EKG:  EKG is ordered today.  The ekg ordered today demonstrates sinus tachycardia heart rate 107, no acute ischemic changes or evidence of infarction, and was personally reviewed by me.   Recent Labs: 11/28/2021: TSH 2.125 05/17/2022: ALT 44; B Natriuretic Peptide 33.9; BUN 20; Creatinine, Ser 0.95; Hemoglobin 13.3; Platelets 220; Potassium 3.5;  Sodium 139   Recent Lipid Panel    Component Value Date/Time   CHOL 162 11/26/2017 0838   TRIG 165 (H) 11/26/2017 0838   HDL 46 11/26/2017 0838   CHOLHDL 3.5 11/26/2017 0838   CHOLHDL 3.5 01/31/2015 1129   VLDL 31 (H) 01/31/2015 1129   LDLCALC 83 11/26/2017 0838    Physical Exam:   VS:  BP (!) 138/96 (BP Location: Left Arm, Patient Position: Sitting, Cuff Size: Large)   Pulse (!) 107   Ht 5\' 4"  (1.626 m)   Wt 237 lb 12.8 oz (107.9 kg)   SpO2 95%   BMI 40.82 kg/m    Wt Readings from Last 3 Encounters:  06/27/22 237 lb 12.8 oz (107.9 kg)  05/17/22 230 lb 13.2 oz (104.7 kg)  11/29/21 230 lb 13.2 oz (104.7 kg)    General: Well nourished, well developed, in no acute distress Head: Atraumatic, normal size  Eyes: PEERLA, EOMI  Neck: Supple, no JVD Endocrine: No thryomegaly Cardiac: Normal S1, S2; RRR; no murmurs, rubs, or gallops Lungs: Clear to auscultation bilaterally, no wheezing, rhonchi or rales  Abd: Soft, nontender, no hepatomegaly  Ext: No edema, pulses 2+ Musculoskeletal: No deformities, BUE and BLE strength normal and equal Skin: Warm and dry, no rashes   Neuro: Alert and oriented to person, place, time, and situation, CNII-XII grossly intact, no focal deficits  Psych: Normal mood and affect   ASSESSMENT:   SIVANA KOLENOVIC is a 62 y.o. female who presents for the following: 1. Nonspecific abnormal  electrocardiogram (ECG) (EKG)   2. Primary hypertension   3. SOB (shortness of breath) on exertion   4. Snoring   5. OSA (obstructive sleep apnea)     PLAN:   1. Nonspecific abnormal electrocardiogram (ECG) (EKG) -Sinus tachycardia.  See discussion hypertension below.  2. Primary hypertension -Suspect her hypertension is related to morbid obesity and possible untreated sleep apnea.  Currently on lisinopril 40 mg daily and HCTZ 25 mg daily.  Recent thyroid studies are normal.  BMI is 41.  We discussed that inactivity can contribute.  We also discussed that weight loss could help immensely.  I also suspect she has progression of sleep apnea.  This is likely contributing.  EKG shows sinus tachycardia.  We will start carvedilol 12.5 mg twice daily.  This will help her heart rate and blood pressure.  I would like for her to get an echocardiogram.  Also referral to sleep medicine.  We discussed that weight loss and dieting will actually help this.  We will also have her keep a log.  She will come back in 3 months for further evaluation.  3. SOB (shortness of breath) on exertion -Suspect this is obesity related.  Recommended continued weight loss.  Echocardiogram ordered as above.  4. Snoring 5. OSA (obstructive sleep apnea) -She reports being told several years ago she likely had sleep apnea.  Reports he did not require sleep machine at this time.  Given worsening hypertension I would like for her to be reevaluated.  Referral to pulmonary medicine for repeat sleep study.      Disposition: Return in about 3 months (around 09/27/2022).  Medication Adjustments/Labs and Tests Ordered: Current medicines are reviewed at length with the patient today.  Concerns regarding medicines are outlined above.  Orders Placed This Encounter  Procedures   Ambulatory referral to Pulmonology   EKG 12-Lead   ECHOCARDIOGRAM COMPLETE   Meds ordered this encounter  Medications   carvedilol (COREG) 12.5  MG tablet     Sig: Take 1 tablet (12.5 mg total) by mouth 2 (two) times daily.    Dispense:  180 tablet    Refill:  3    Patient Instructions  Medication Instructions:  START Carvedilol 12.5 mg twice daily   *If you need a refill on your cardiac medications before your next appointment, please call your pharmacy*   Testing/Procedures:  Echocardiogram - Your physician has requested that you have an echocardiogram. Echocardiography is a painless test that uses sound waves to create images of your heart. It provides your doctor with information about the size and shape of your heart and how well your heart's chambers and valves are working. This procedure takes approximately one hour. There are no restrictions for this procedure.    Follow-Up: At [facility], you and your health needs are our priority.  As part of our continuing mission to provide you with exceptional heart care, we have created designated Provider Care Teams.  These Care Teams include your primary Cardiologist (physician) and Advanced Practice Providers (APPs -  Physician Assistants and Nurse Practitioners) who all work together to provide you with the care you need, when you need it.  We recommend signing up for the patient portal called "MyChart".  Sign up information is provided on this After Visit Summary.  MyChart is used to connect with patients for Virtual Visits (Telemedicine).  Patients are able to view lab/test results, encounter notes, upcoming appointments, etc.  Non-urgent messages can be sent to your provider as well.   To learn more about what you can do with MyChart, go to [email or web address].    Your next appointment:   3 month(s)  Provider:   Lennie Odor, MD   Other Instructions Referral to Pulmonary for sleep study.  Check BP x1 daily-    Signed, Lenna Gilford. Flora Lipps, MD, [facility]  [facility]  [address] [place], [place] [address] [phone]  06/27/2022 6:39 PM

## 2022-06-27 ENCOUNTER — Encounter: Payer: Self-pay | Admitting: Cardiovascular Disease

## 2022-06-27 ENCOUNTER — Ambulatory Visit: Payer: BC Managed Care – PPO | Attending: Cardiovascular Disease | Admitting: Cardiovascular Disease

## 2022-06-27 VITALS — BP 138/96 | HR 107 | Ht 64.0 in | Wt 237.8 lb

## 2022-06-27 DIAGNOSIS — G4733 Obstructive sleep apnea (adult) (pediatric): Secondary | ICD-10-CM

## 2022-06-27 DIAGNOSIS — R0602 Shortness of breath: Secondary | ICD-10-CM

## 2022-06-27 DIAGNOSIS — R9431 Abnormal electrocardiogram [ECG] [EKG]: Secondary | ICD-10-CM | POA: Diagnosis not present

## 2022-06-27 DIAGNOSIS — I10 Essential (primary) hypertension: Secondary | ICD-10-CM

## 2022-06-27 DIAGNOSIS — R0683 Snoring: Secondary | ICD-10-CM | POA: Diagnosis not present

## 2022-06-27 MED ORDER — CARVEDILOL 12.5 MG PO TABS: 12.5000 mg | ORAL_TABLET | Freq: Two times a day (BID) | ORAL | 3 refills | Status: AC

## 2022-06-27 NOTE — Patient Instructions (Addendum)
Medication Instructions:  START Carvedilol 12.5 mg twice daily   *If you need a refill on your cardiac medications before your next appointment, please call your pharmacy*   Testing/Procedures:  Echocardiogram - Your physician has requested that you have an echocardiogram. Echocardiography is a painless test that uses sound waves to create images of your heart. It provides your doctor with information about the size and shape of your heart and how well your heart's chambers and valves are working. This procedure takes approximately one hour. There are no restrictions for this procedure.    Follow-Up: At [facility], you and your health needs are our priority.  As part of our continuing mission to provide you with exceptional heart care, we have created designated Provider Care Teams.  These Care Teams include your primary Cardiologist (physician) and Advanced Practice Providers (APPs -  Physician Assistants and Nurse Practitioners) who all work together to provide you with the care you need, when you need it.  We recommend signing up for the patient portal called "MyChart".  Sign up information is provided on this After Visit Summary.  MyChart is used to connect with patients for Virtual Visits (Telemedicine).  Patients are able to view lab/test results, encounter notes, upcoming appointments, etc.  Non-urgent messages can be sent to your provider as well.   To learn more about what you can do with MyChart, go to [email or web address].    Your next appointment:   3 month(s)  Provider:   Lennie Odor, MD   Other Instructions Referral to Pulmonary for sleep study.  Check BP x1 daily-

## 2022-07-22 ENCOUNTER — Ambulatory Visit (HOSPITAL_COMMUNITY): Payer: BC Managed Care – PPO | Attending: Cardiovascular Disease

## 2022-07-22 DIAGNOSIS — R9431 Abnormal electrocardiogram [ECG] [EKG]: Secondary | ICD-10-CM | POA: Diagnosis present

## 2022-07-22 DIAGNOSIS — I10 Essential (primary) hypertension: Secondary | ICD-10-CM | POA: Diagnosis not present

## 2022-07-22 LAB — ECHOCARDIOGRAM COMPLETE
Area-P 1/2: 3.85 cm2
S' Lateral: 2.9 cm

## 2022-09-27 ENCOUNTER — Ambulatory Visit: Payer: BC Managed Care – PPO | Admitting: Cardiovascular Disease

## 2022-11-27 ENCOUNTER — Emergency Department (HOSPITAL_COMMUNITY)
Admission: EM | Admit: 2022-11-27 | Discharge: 2022-11-27 | Disposition: A | Discharge status: Home / Self Care | Payer: BC Managed Care – PPO

## 2022-11-27 ENCOUNTER — Emergency Department (HOSPITAL_COMMUNITY): Payer: BC Managed Care – PPO

## 2022-11-27 ENCOUNTER — Encounter (HOSPITAL_COMMUNITY): Payer: Self-pay | Admitting: Emergency Medicine

## 2022-11-27 DIAGNOSIS — Z7984 Long term (current) use of oral hypoglycemic drugs: Secondary | ICD-10-CM | POA: Insufficient documentation

## 2022-11-27 DIAGNOSIS — E119 Type 2 diabetes mellitus without complications: Secondary | ICD-10-CM | POA: Diagnosis not present

## 2022-11-27 DIAGNOSIS — H5789 Other specified disorders of eye and adnexa: Secondary | ICD-10-CM

## 2022-11-27 DIAGNOSIS — H11431 Conjunctival hyperemia, right eye: Secondary | ICD-10-CM | POA: Diagnosis present

## 2022-11-27 LAB — BASIC METABOLIC PANEL
Anion gap: 11 (ref 5–15)
BUN: 20 mg/dL (ref 8–23)
CO2: 24 mmol/L (ref 22–32)
Calcium: 9.9 mg/dL (ref 8.9–10.3)
Chloride: 103 mmol/L (ref 98–111)
Creatinine, Ser: 1.03 mg/dL — ABNORMAL HIGH (ref 0.44–1.00)
GFR, Estimated: >60 mL/min (ref >60)
Glucose, Bld: 100 mg/dL — ABNORMAL HIGH (ref 70–99)
Potassium: 3.5 mmol/L (ref 3.5–5.1)
Sodium: 138 mmol/L (ref 135–145)

## 2022-11-27 LAB — CBC WITH DIFFERENTIAL/PLATELET
Abs Immature Granulocytes: 0.03 10*3/uL (ref 0.00–0.07)
Basophils Absolute: 0.1 10*3/uL (ref 0.0–0.1)
Basophils Relative: 1 %
Eosinophils Absolute: 0.2 10*3/uL (ref 0.0–0.5)
Eosinophils Relative: 2 %
HCT: 40.6 % (ref 36.0–46.0)
Hemoglobin: 14.2 g/dL (ref 12.0–15.0)
Immature Granulocytes: 0 %
Lymphocytes Relative: 34 %
Lymphs Abs: 3.4 10*3/uL (ref 0.7–4.0)
MCH: 29.5 pg (ref 26.0–34.0)
MCHC: 35 g/dL (ref 30.0–36.0)
MCV: 84.2 fL (ref 80.0–100.0)
Monocytes Absolute: 0.6 10*3/uL (ref 0.1–1.0)
Monocytes Relative: 6 %
Neutro Abs: 5.7 10*3/uL (ref 1.7–7.7)
Neutrophils Relative %: 57 %
Platelets: 245 10*3/uL (ref 150–400)
RBC: 4.82 MIL/uL (ref 3.87–5.11)
RDW: 12.6 % (ref 11.5–15.5)
WBC: 9.9 10*3/uL (ref 4.0–10.5)
nRBC: 0 % (ref 0.0–0.2)

## 2022-11-27 LAB — I-STAT CHEM 8, ED
BUN: 19 mg/dL (ref 8–23)
Calcium, Ion: 1.16 mmol/L (ref 1.15–1.40)
Chloride: 101 mmol/L (ref 98–111)
Creatinine, Ser: 1.1 mg/dL — ABNORMAL HIGH (ref 0.44–1.00)
Glucose, Bld: 105 mg/dL — ABNORMAL HIGH (ref 70–99)
HCT: 40 % (ref 36.0–46.0)
Hemoglobin: 13.6 g/dL (ref 12.0–15.0)
Potassium: 3.5 mmol/L (ref 3.5–5.1)
Sodium: 138 mmol/L (ref 135–145)
TCO2: 25 mmol/L (ref 22–32)

## 2022-11-27 MED ORDER — DIPHENHYDRAMINE HCL 50 MG/ML IJ SOLN
25.0000 mg | Freq: Once | INTRAMUSCULAR | Status: AC
  Administered 2022-11-27: 25 mg via INTRAVENOUS
  Filled 2022-11-27: qty 1

## 2022-11-27 MED ORDER — TETRACAINE HCL 0.5 % OP SOLN
2.0000 [drp] | Freq: Once | OPHTHALMIC | Status: AC
  Administered 2022-11-27: 2 [drp] via OPHTHALMIC
  Filled 2022-11-27: qty 4

## 2022-11-27 MED ORDER — OFLOXACIN 0.3 % OP SOLN: 1.0000 [drp] | Freq: Four times a day (QID) | OPHTHALMIC | 0 refills | Status: AC

## 2022-11-27 MED ORDER — PROCHLORPERAZINE EDISYLATE 10 MG/2ML IJ SOLN
10.0000 mg | Freq: Once | INTRAMUSCULAR | Status: AC
  Administered 2022-11-27: 10 mg via INTRAVENOUS
  Filled 2022-11-27: qty 2

## 2022-11-27 MED ORDER — FLUORESCEIN SODIUM 1 MG OP STRP
1.0000 | ORAL_STRIP | Freq: Once | OPHTHALMIC | Status: AC
  Administered 2022-11-27: 1 via OPHTHALMIC
  Filled 2022-11-27: qty 1

## 2022-11-27 MED ORDER — IOHEXOL 350 MG/ML SOLN
60.0000 mL | Freq: Once | INTRAVENOUS | Status: AC | PRN
  Administered 2022-11-27: 60 mL via INTRAVENOUS

## 2022-11-27 NOTE — ED Notes (Signed)
Left before this RN could obtain updated vitals

## 2022-11-27 NOTE — ED Notes (Signed)
Water provided

## 2022-11-27 NOTE — ED Triage Notes (Signed)
Pt here from home with c/o right eye pain and swelling that started yesterday , blurred vision , pain with movement

## 2022-11-27 NOTE — Discharge Instructions (Addendum)
Please follow-up with the ophthalmologist I have attached your for you today.  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  I have given you antibiotic eyedrops as well to pick up to cover for any infection until you are able to see the specialist.  If symptoms change or worsen please return to ER.

## 2022-11-27 NOTE — ED Provider Triage Note (Signed)
Emergency Medicine Provider Triage Evaluation Note  Catherine Trevino , a 62 y.o. female  was evaluated in triage.  Pt complains of right eye redness along with swelling that began last night.  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  Patient was a primary care provider and was referred here for imaging and labs.  Patient's had green discharge as well but denies any fevers.  Patient does stay on the right side behind her eye she has a dull headache but denies any new onset weakness, paresthesias, neck pain and.  Patient states that every once a while she gets blurry vision of both eyes but that this is intermediate and does not know any triggers.  Patient denies any chest pain or shortness of breath.  Review of Systems  Positive: See HPI Negative: See HPI  Physical Exam  BP (!) 150/78 (BP Location: Left Arm)   Pulse 87   Temp (!) 97.3 F (36.3 C) (Oral)   Resp 16   SpO2 96%  Gen:   Awake, no distress   Resp:  Normal effort  MSK:   Moves extremities without difficulty  Other:  Painful EOM on the right side with subconjunctival hemorrhage noted, pupils PERRLA bilaterally, green discharge out of right eye as well, mild edema however I do not appreciate any erythema or warmth in the area  Medical Decision Making  Medically screening exam initiated at 4:07 PM.  Appropriate orders placed.  Catherine Trevino was informed that the remainder of the evaluation will be completed by another provider, this initial triage assessment does not replace that evaluation, and the importance of remaining in the ED until their evaluation is complete.  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   Catherine Corrigan, PA-C 11/27/22 1609

## 2022-11-27 NOTE — ED Provider Notes (Signed)
[place] EMERGENCY DEPARTMENT AT [facility] Provider Note   CSN: [id number] Arrival date & time: 11/27/22  1519     History  No chief complaint on file.   Catherine Trevino is a 62 y.o. female history of GERD, type 2 diabetes, left orbital cellulitis presented with right eye redness along with swelling that began last night.  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  Patient was a primary care provider and was referred here for imaging and labs.  Patient's had green discharge as well but denies any fevers.  Patient does stay on the right side behind her eye she has a dull headache but denies any new onset weakness, paresthesias, neck pain and.  Patient states that every once a while she gets blurry vision of both eyes but that this is intermediate and does not know any triggers.  Patient denies any chest pain or shortness of breath.  Patient wears glasses but does not wear contacts.  Home Medications Prior to Admission medications   Medication Sig Start Date End Date Taking? Authorizing Provider  ofloxacin (OCUFLOX) 0.3 % ophthalmic solution Place 1 drop into the right eye 4 (four) times daily for 7 days. 11/27/22 12/04/22 Yes Malan Werk, Beverly Gust, PA-C  acyclovir 750 mg in dextrose 5 % 250 mL Inject 750 mg into the vein every 8 (eight) hours. 11/29/21   Regalado, Belkys A, MD  atorvastatin (LIPITOR) 10 MG tablet Take 10 mg by mouth daily. 05/15/20   [provider]  buPROPion (WELLBUTRIN) 75 MG tablet Take by mouth. 10/25/16   [provider]  carvedilol (COREG) 12.5 MG tablet Take 1 tablet (12.5 mg total) by mouth 2 (two) times daily. 06/27/22 09/25/22  O'NealRonnald Ramp, MD  cefTRIAXone 2 g in sodium chloride 0.9 % 100 mL Inject 2 g into the vein daily. 11/29/21   Regalado, Belkys A, MD  hydrochlorothiazide  (HYDRODIURIL) 25 MG tablet Take 25 mg by mouth daily. 06/14/22   [provider]  lisdexamfetamine (VYVANSE) 70 MG capsule Take 70 mg by mouth daily. 06/06/22   [provider]  lisinopril (ZESTRIL) 40 MG tablet Take 40 mg by mouth daily. 06/14/22   [provider]  metFORMIN (GLUCOPHAGE) 500 MG tablet Take by mouth. 12/06/21 12/06/22  [provider]  methylPREDNISolone sodium succinate (SOLU-MEDROL) 125 mg/2 mL injection Inject 1.6 mLs (100 mg total) into the vein daily. 11/30/21   Regalado, Belkys A, MD  Multiple Vitamins-Minerals (MULTIVITAMIN WOMEN 50+ PO) Take 1 tablet by mouth daily.    [provider]  olmesartan (BENICAR) 40 MG tablet Take by mouth. Patient not taking: Reported on 06/27/2022 03/08/22   [provider]  RYBELSUS 14 MG TABS Take 1 tablet by mouth daily. 06/06/22   [provider]  Semaglutide 3 MG TABS Take by mouth. 12/06/21   [provider]  torsemide (DEMADEX) 5 MG tablet Take by mouth. Patient not taking: Reported on 06/27/2022 03/08/22   [provider]  vancomycin (VANCOREADY) 1250 MG/250ML SOLN Inject 250 mLs (1,250 mg total) into the vein daily. 11/29/21   Regalado, Jon Billings A, MD      Allergies    Silicone and Tape    Review of Systems   Review of Systems  Physical Exam Updated Vital Signs BP 134/72 (BP Location: Left Arm)  Pulse 80   Temp 97.9 F (36.6 C) (Oral)   Resp 20   SpO2 94%  Physical Exam Constitutional:      General: She is not in acute distress.    Appearance: She is normal weight.  HENT:     Head: Normocephalic.     Comments: No temporal artery tenderness No jaw claudication Eyes:     General: Lids are normal. Lids are everted, no foreign bodies appreciated. Vision grossly intact. Gaze aligned appropriately. No visual field deficit.       Right eye: Discharge Chilton Si) present. No foreign body or hordeolum.        Left eye: No foreign body, discharge or  hordeolum.     Intraocular pressure: Right eye pressure is 19 mmHg.     Extraocular Movements: Extraocular movements intact.     Conjunctiva/sclera:     Right eye: Right conjunctiva is injected.     Pupils: Pupils are equal, round, and reactive to light.     Right eye: No corneal abrasion or fluorescein uptake. Seidel exam negative.     Left eye: No corneal abrasion or fluorescein uptake. Seidel exam negative.    Comments: No erythema or edema noted around the eyes No periorbital ecchymosis noted  Cardiovascular:     Rate and Rhythm: Normal rate and regular rhythm.     Pulses: Normal pulses.     Heart sounds: Normal heart sounds.  Pulmonary:     Effort: Pulmonary effort is normal.     Breath sounds: Normal breath sounds.  Abdominal:     Palpations: Abdomen is soft.     Tenderness: There is no abdominal tenderness. There is no guarding or rebound.  Musculoskeletal:        General: Normal range of motion.     Cervical back: Normal range of motion. No rigidity or tenderness.  Skin:    General: Skin is warm and dry.     Capillary Refill: Capillary refill takes less than 2 seconds.  Neurological:     General: No focal deficit present.     Mental Status: She is alert.     Sensory: Sensation is intact.     Motor: Motor function is intact.     Coordination: Coordination is intact.     Gait: Gait is intact.     Comments: Cranial nerves III through XII intact Vision grossly intact Sensation tact in all 4 extremities  Psychiatric:        Mood and Affect: Mood normal.     ED Results / Procedures / Treatments   Labs (all labs ordered are listed, but only abnormal results are displayed) Labs Reviewed  BASIC METABOLIC PANEL - Abnormal; Notable for the following components:      Result Value   Glucose, Bld 100 (*)    Creatinine, Ser 1.03 (*)    All other components within normal limits  I-STAT CHEM 8, ED - Abnormal; Notable for the following components:   Creatinine, Ser 1.10 (*)     Glucose, Bld 105 (*)    All other components within normal limits  CBC WITH DIFFERENTIAL/PLATELET    EKG None  Radiology CT Head Wo Contrast  Result Date: 11/27/2022 CLINICAL DATA:  Initial evaluation for right eye redness, pain, headache. EXAM: CT HEAD WITHOUT CONTRAST TECHNIQUE: Contiguous axial images were obtained from the base of the skull through the vertex without intravenous contrast. RADIATION DOSE REDUCTION: This exam was performed according to the departmental dose-optimization program which includes automated  exposure control, adjustment of the mA and/or kV according to patient size and/or use of iterative reconstruction technique. COMPARISON:  Prior study from 11/29/2021. FINDINGS: Brain: Cerebral volume within normal limits. No acute intracranial hemorrhage. No acute large vessel territory infarct. No mass lesion or midline shift. No hydrocephalus or extra-axial fluid collection. Vascular: No abnormal hyperdense vessel. Scattered vascular calcifications noted within the carotid siphons. Skull: Scalp soft tissues within normal limits.  Calvarium intact. Sinuses/Orbits: Subtle hazy stranding within the intraconal fat of the right orbit noted, better evaluated on concomitant CT of the orbits. Mild mucoperiosteal thickening present about the ethmoidal air cells. Paranasal sinuses are otherwise clear. No mastoid effusion. Other: None. IMPRESSION: 1. No acute intracranial abnormality. 2. Subtle hazy stranding within the intraconal fat of the right orbit, better evaluated on concomitant CT of the orbits. Electronically Signed   By: Rise Mu M.D.   On: 11/27/2022 20:15   CT Orbits W Contrast  Result Date: 11/27/2022 CLINICAL DATA:  Initial evaluation for acute right eye pain and redness. Headache. EXAM: CT ORBITS WITH CONTRAST TECHNIQUE: Multidetector CT images was performed according to the standard protocol following intravenous contrast administration. RADIATION DOSE  REDUCTION: This exam was performed according to the departmental dose-optimization program which includes automated exposure control, adjustment of the mA and/or kV according to patient size and/or use of iterative reconstruction technique. CONTRAST:  60mL OMNIPAQUE IOHEXOL 350 MG/ML SOLN COMPARISON:  None Available. FINDINGS: Orbits: Globes are symmetric in size with normal appearance and morphology bilaterally. There is subtle induration with hazy stranding within the intraconal fat of the right orbit (series 3, image 24). Changes are perhaps most pronounced about the right lateral rectus muscle which is mildly thickened and edematous. Optic nerves symmetric and within normal limits. Lacrimal glands within normal limits. No visible extension through the right orbital apex. No loculated collections. Contralateral left globe and orbit are within normal limits. Visible paranasal sinuses: Mild mucoperiosteal thickening about the ethmoidal air cells. Visualized paranasal sinuses are otherwise clear. Visualized mastoids and middle ear cavities are well pneumatized and free of fluid. Soft tissues: Preseptal and periorbital soft tissues are within normal limits without significant swelling or inflammatory changes. No collections. Osseous: Visualized osseous structures intact and unremarkable. Limited intracranial: Unremarkable. IMPRESSION: 1. Subtle induration with hazy stranding within the intraconal fat of the right orbit, most pronounced about the right lateral rectus muscle. Findings are nonspecific, and could be either infectious or inflammatory in nature, with postseptal/intraorbital cellulitis not excluded. Possible changes of idiopathic orbital inflammation (pseudotumor) could be considered given the findings about the contralateral left orbit seen on prior brain MRI on 11/29/2021. 2. No loculated or drainable fluid collections. Electronically Signed   By: Rise Mu M.D.   On: 11/27/2022 20:10     Procedures Procedures    Medications Ordered in ED Medications  tetracaine (PONTOCAINE) 0.5 % ophthalmic solution 2 drop (2 drops Right Eye Given by Other 11/27/22 1823)  fluorescein ophthalmic strip 1 strip (1 strip Right Eye Given by Other 11/27/22 1823)  prochlorperazine (COMPAZINE) injection 10 mg (10 mg Intravenous Given 11/27/22 1901)  diphenhydrAMINE (BENADRYL) injection 25 mg (25 mg Intravenous Given 11/27/22 1858)  iohexol (OMNIPAQUE) 350 MG/ML injection 60 mL (60 mLs Intravenous Contrast Given 11/27/22 1936)    ED Course/ Medical Decision Making/ A&P                                 Medical  Decision Making Amount and/or Complexity of Data Reviewed Labs: ordered. Radiology: ordered.   Romualdo Bolk 62 y.o. presented today for eye pain.  Working DDx that I considered at this time includes, but not limited to, preorbital/orbital cellulitis, acute glaucoma, HSV infection, open globe, conjunctivitis, hordeolum/chalazion, FB, CRAO/CRVO.  R/o DDx: preorbital/orbital cellulitis, acute glaucoma, HSV infection, open globe, hordeolum/chalazion, FB, CRAO/CRVO: These are considered less likely due to history of present illness, physical exam, lab/imaging findings.  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  Patient was assessed  by this provider and triage and does have right eye conjunctival injection with green discharge.  I did not note any edema your erythema around the right eye however patient does have mild discomfort with extraocular movements.  Rest patient's visit was unremarkable.  Patient states that this is similar to when she began having orbital cellulitis last year and was told that she needs to have a CT scan by her primary care provider.  Will obtain basic labs and scan to rule out preseptal/orbital cellulitis however will also stain her eye and obtain visual acuity along with Tono-Pen to rule out other causes however I do suspect this is bacterial conjunctivitis in nature.  Fluorescein stain was unremarkable and patient's ocular pressure was also unremarkable.  Will give Compazine and Benadryl for patient's right-sided headache.  Patient did not have any temporal artery tenderness on exam which claudication that would indicate GCA.  Patient does note that she has the headache behind her right eye and so this could be a migraine versus cluster headache as well.  CT was nonspecific.  Ophthalmology was consulted who recommends following up with him and that he has an appointment tomorrow for to call.  Patient's visual acuity was unremarkable.  Patient did improve with the Benadryl and Compazine for the headache and feels safe to be discharged.  Will give antibiotic eyedrops as well to cover for any bacterial conjunctivitis given her right eye along with green discharge found on exam.  Patient was given return precautions. Patient stable for discharge at this time.  Patient verbalized understanding of plan.  This chart was dictated using voice recognition software.  Despite best efforts to proofread,  errors can occur which can change the documentation meaning.         Final Clinical Impression(s) / ED Diagnoses Final diagnoses:  Red eye    Rx / DC Orders ED Discharge Orders          Ordered     ofloxacin (OCUFLOX) 0.3 % ophthalmic solution  4 times daily        11/27/22 2114              Netta Corrigan, PA-C 11/27/22 2123    Coral Spikes, DO 11/28/22 0022

## 2022-12-01 NOTE — Progress Notes (Unsigned)
Cardiology Office Note:  .   Date:  12/02/2022  ID:  Romualdo Bolk, DOB 1960-08-13, MRN [id number] PCP: Felix Pacini, FNP  [facility] Providers Cardiologist:  Reatha Harps, MD { History of Present Illness: ZAYLAH SICOLI is a 62 y.o. female with history of obesity, HTN, OSA who presents for follow-up.    History of Present Illness   Miss Cheese, a 62 year old female with a history of obesity, hypertension, and obstructive sleep apnea (OSA), presents for a follow-up visit. She was seen several months ago with elevated blood pressures, and her medications were adjusted. Today, her blood pressure is 130/80. She has been monitoring her blood pressure at home, which has been running slightly higher than today's reading, but has been lower during her monthly visits to her primary care provider.  She had a sleep apnea test years ago and was borderline. She has not followed up with a sleep specialist due to her busy work schedule and high-stress job. She reports feeling tired during the day, which she attributes to her job stress. She is currently on carvedilol 12.5mg  twice a day, hydrochlorothiazide 25mg  daily, and lisinopril 40mg  daily. She also takes Lipitor 10mg  daily for cholesterol management.  She recently had lab work done during an ER visit for eye inflammation, which she suspects might be related to her COVID-19 booster shot. The ER visit revealed no infection, but a CT scan confirmed swelling. She also reports occasional skipped heartbeats, which occur every other day and are brief. She has no history of heart attack or stroke.          Problem List DM -A1c 5.5 HTN Obesity Eye Inflammation  BMI 40 OSA    ROS: All other ROS reviewed and negative. Pertinent positives noted in the HPI.     Studies Reviewed: Marland Kitchen        TTE 07/22/2022  1. Left ventricular ejection fraction, by estimation, is 55 to 60%. The  left ventricle has normal function. The left  ventricle has no regional  wall motion abnormalities. There is mild concentric left ventricular  hypertrophy. Left ventricular diastolic  parameters are consistent with Grade I diastolic dysfunction (impaired  relaxation).   2. Right ventricular systolic function is low normal. The right  ventricular size is normal. There is normal pulmonary artery systolic  pressure.   3. The mitral valve is normal in structure. Trivial mitral valve  regurgitation. No evidence of mitral stenosis.   4. The aortic valve is tricuspid. Aortic valve regurgitation is not  visualized. No aortic stenosis is present.   5. The inferior vena cava is normal in size with greater than 50%  respiratory variability, suggesting right atrial pressure of 3 mmHg.  Physical Exam:   VS:  BP 130/80   Pulse 83   Ht 5\' 4"  (1.626 m)   Wt 245 lb (111.1 kg)   SpO2 96%   BMI 42.05 kg/m    Wt Readings from Last 3 Encounters:  12/02/22 245 lb (111.1 kg)  06/27/22 237 lb 12.8 oz (107.9 kg)  05/17/22 230 lb 13.2 oz (104.7 kg)    GEN: Well nourished, well developed in no acute distress NECK: No JVD; No carotid bruits CARDIAC: RRR, no murmurs, rubs, gallops RESPIRATORY:  Clear to auscultation without rales, wheezing or rhonchi  ABDOMEN: Soft, non-tender, non-distended EXTREMITIES:  No edema; No deformity  ASSESSMENT AND PLAN: .   Assessment and Plan    Hypertension Well controlled on current  regimen of Carvedilol 12.5mg  BID, Hydrochlorothiazide 25mg  daily, and Lisinopril 40mg  daily. Home readings have been lower than previous elevated readings. -Continue current medication regimen. -Check blood pressure at home regularly.  Hyperlipidemia LDL cholesterol 24 on Lipitor 10mg  daily. -Continue Lipitor 10mg  daily.  Obesity BMI 42. Patient is engaged with a weight loss clinic. -Continue engagement with weight loss clinic.  Possible Sleep Apnea Patient reported being borderline years ago. No current symptoms of excessive  daytime sleepiness or low energy. -Encouraged to follow up with a sleep specialist for re-evaluation.  Follow-up As needed.              Follow-up: Return if symptoms worsen or fail to improve.  Time Spent with Patient: I have spent a total of 25 minutes caring for this patient today face to face, ordering and reviewing labs/tests, reviewing prior records/medical history, examining the patient, establishing an assessment and plan, communicating results/findings to the patient/family, and documenting in the medical record.   Signed, Lenna Gilford. Flora Lipps, MD, [facility]  [facility]  [address] [place], [place] [address] [phone]  9:50 AM

## 2022-12-02 ENCOUNTER — Encounter: Payer: Self-pay | Admitting: Cardiovascular Disease

## 2022-12-02 ENCOUNTER — Ambulatory Visit: Payer: BC Managed Care – PPO | Attending: Cardiovascular Disease | Admitting: Cardiovascular Disease

## 2022-12-02 VITALS — BP 130/80 | HR 83 | Ht 64.0 in | Wt 245.0 lb

## 2022-12-02 DIAGNOSIS — I10 Essential (primary) hypertension: Secondary | ICD-10-CM | POA: Diagnosis not present

## 2022-12-02 DIAGNOSIS — R0602 Shortness of breath: Secondary | ICD-10-CM

## 2022-12-02 DIAGNOSIS — E782 Mixed hyperlipidemia: Secondary | ICD-10-CM

## 2022-12-02 DIAGNOSIS — R9431 Abnormal electrocardiogram [ECG] [EKG]: Secondary | ICD-10-CM

## 2022-12-02 NOTE — Patient Instructions (Addendum)
Medication Instructions:  No changes    *If you need a refill on your cardiac medications before your next appointment, please call your pharmacy*   Lab Work: None    If you have labs (blood work) drawn today and your tests are completely normal, you will receive your results only by: MyChart Message (if you have MyChart) OR A paper copy in the mail If you have any lab test that is abnormal or we need to change your treatment, we will call you to review the results.   Testing/Procedures: None    Follow-Up: At [facility], you and your health needs are our priority.  As part of our continuing mission to provide you with exceptional heart care, we have created designated Provider Care Teams.  These Care Teams include your primary Cardiologist (physician) and Advanced Practice Providers (APPs -  Physician Assistants and Nurse Practitioners) who all work together to provide you with the care you need, when you need it.  We recommend signing up for the patient portal called "MyChart".  Sign up information is provided on this After Visit Summary.  MyChart is used to connect with patients for Virtual Visits (Telemedicine).  Patients are able to view lab/test results, encounter notes, upcoming appointments, etc.  Non-urgent messages can be sent to your provider as well.   To learn more about what you can do with MyChart, go to [email or web address].    Your next appointment:   Follow up as needed    Provider:   Reatha Harps, MD    Other Instructions

## 2022-12-04 ENCOUNTER — Other Ambulatory Visit: Payer: Self-pay | Admitting: Medical Genetics

## 2022-12-04 DIAGNOSIS — Z006 Encounter for examination for normal comparison and control in clinical research program: Secondary | ICD-10-CM

## 2023-01-03 ENCOUNTER — Other Ambulatory Visit (HOSPITAL_COMMUNITY): Payer: Self-pay

## 2023-01-27 ENCOUNTER — Other Ambulatory Visit (HOSPITAL_COMMUNITY)
Admission: RE | Admit: 2023-01-27 | Discharge: 2023-01-27 | Disposition: A | Discharge status: Home / Self Care | Payer: Self-pay | Source: Ambulatory Visit | Attending: Medical Genetics | Admitting: Medical Genetics

## 2023-01-27 DIAGNOSIS — Z006 Encounter for examination for normal comparison and control in clinical research program: Secondary | ICD-10-CM | POA: Insufficient documentation

## 2023-02-04 LAB — GENECONNECT MOLECULAR SCREEN: Genetic Analysis Overall Interpretation: NEGATIVE

## 2023-03-28 ENCOUNTER — Other Ambulatory Visit (HOSPITAL_BASED_OUTPATIENT_CLINIC_OR_DEPARTMENT_OTHER): Payer: Self-pay

## 2023-03-28 MED ORDER — TIZANIDINE HCL 4 MG PO TABS
4.0000 mg | ORAL_TABLET | Freq: Three times a day (TID) | ORAL | 0 refills | Status: AC
  Filled 2023-03-28: qty 90, 30d supply, fill #0

## 2023-03-28 MED ORDER — LISDEXAMFETAMINE DIMESYLATE 50 MG PO CAPS
50.0000 mg | ORAL_CAPSULE | Freq: Every day | ORAL | 0 refills | Status: DC
  Filled 2023-03-28: qty 30, 30d supply, fill #0

## 2023-04-25 ENCOUNTER — Other Ambulatory Visit (HOSPITAL_BASED_OUTPATIENT_CLINIC_OR_DEPARTMENT_OTHER): Payer: Self-pay

## 2023-04-25 MED ORDER — LISDEXAMFETAMINE DIMESYLATE 50 MG PO CAPS
50.0000 mg | ORAL_CAPSULE | Freq: Every day | ORAL | 0 refills | Status: DC
  Filled 2023-04-25: qty 30, 30d supply, fill #0

## 2023-04-25 MED ORDER — MOUNJARO 7.5 MG/0.5ML ~~LOC~~ SOAJ
7.5000 mg | SUBCUTANEOUS | 0 refills | Status: DC
  Filled 2023-04-25: qty 2, 28d supply, fill #0

## 2023-05-22 ENCOUNTER — Other Ambulatory Visit (HOSPITAL_BASED_OUTPATIENT_CLINIC_OR_DEPARTMENT_OTHER): Payer: Self-pay

## 2023-05-22 MED ORDER — MOUNJARO 7.5 MG/0.5ML ~~LOC~~ SOAJ
7.5000 mg | SUBCUTANEOUS | 0 refills | Status: AC
  Filled 2023-05-22: qty 2, 28d supply, fill #0

## 2023-05-22 MED ORDER — LISDEXAMFETAMINE DIMESYLATE 50 MG PO CAPS: 50.0000 mg | ORAL_CAPSULE | Freq: Every day | ORAL | 0 refills | Status: AC
